# Patient Record
Sex: Female | Born: 1935 | Race: White | Hispanic: No | Marital: Single | Smoking: Never smoker
Health system: Southern US, Community
[De-identification: ages and names within clinical notes are randomized; demographics above are authoritative.]

## PROBLEM LIST (undated history)

## (undated) DIAGNOSIS — G4733 Obstructive sleep apnea (adult) (pediatric): Secondary | ICD-10-CM

## (undated) DIAGNOSIS — N259 Disorder resulting from impaired renal tubular function, unspecified: Secondary | ICD-10-CM

## (undated) DIAGNOSIS — M1990 Unspecified osteoarthritis, unspecified site: Secondary | ICD-10-CM

## (undated) DIAGNOSIS — E039 Hypothyroidism, unspecified: Secondary | ICD-10-CM

## (undated) DIAGNOSIS — M810 Age-related osteoporosis without current pathological fracture: Secondary | ICD-10-CM

## (undated) DIAGNOSIS — J309 Allergic rhinitis, unspecified: Secondary | ICD-10-CM

## (undated) DIAGNOSIS — E119 Type 2 diabetes mellitus without complications: Secondary | ICD-10-CM

## (undated) DIAGNOSIS — G4700 Insomnia, unspecified: Secondary | ICD-10-CM

## (undated) DIAGNOSIS — F341 Dysthymic disorder: Secondary | ICD-10-CM

## (undated) DIAGNOSIS — R7983 Abnormal findings of blood amino-acid level: Secondary | ICD-10-CM

## (undated) DIAGNOSIS — K219 Gastro-esophageal reflux disease without esophagitis: Secondary | ICD-10-CM

## (undated) DIAGNOSIS — I82409 Acute embolism and thrombosis of unspecified deep veins of unspecified lower extremity: Secondary | ICD-10-CM

## (undated) DIAGNOSIS — E7800 Pure hypercholesterolemia, unspecified: Secondary | ICD-10-CM

## (undated) DIAGNOSIS — I10 Essential (primary) hypertension: Secondary | ICD-10-CM

## (undated) DIAGNOSIS — E7219 Other disorders of sulfur-bearing amino-acid metabolism: Secondary | ICD-10-CM

## (undated) HISTORY — DX: Type 2 diabetes mellitus without complications: E11.9

## (undated) HISTORY — DX: Essential (primary) hypertension: I10

## (undated) HISTORY — DX: Obstructive sleep apnea (adult) (pediatric): G47.33

## (undated) HISTORY — DX: Acute embolism and thrombosis of unspecified deep veins of unspecified lower extremity: I82.409

## (undated) HISTORY — DX: Dysthymic disorder: F34.1

## (undated) HISTORY — DX: Disorder resulting from impaired renal tubular function, unspecified: N25.9

## (undated) HISTORY — DX: Gastro-esophageal reflux disease without esophagitis: K21.9

## (undated) HISTORY — DX: Age-related osteoporosis without current pathological fracture: M81.0

## (undated) HISTORY — DX: Insomnia, unspecified: G47.00

## (undated) HISTORY — DX: Abnormal findings of blood amino-acid level: R79.83

## (undated) HISTORY — DX: Hypothyroidism, unspecified: E03.9

## (undated) HISTORY — DX: Pure hypercholesterolemia, unspecified: E78.00

## (undated) HISTORY — DX: Other disorders of sulfur-bearing amino-acid metabolism: E72.19

## (undated) HISTORY — DX: Allergic rhinitis, unspecified: J30.9

## (undated) HISTORY — DX: Unspecified osteoarthritis, unspecified site: M19.90

---

## 1999-04-09 ENCOUNTER — Encounter: Admission: RE | Admit: 1999-04-09 | Discharge: 1999-06-14 | Payer: Self-pay | Admitting: Endocrinology

## 1999-04-22 ENCOUNTER — Encounter: Payer: Self-pay | Admitting: Specialist

## 1999-04-26 ENCOUNTER — Ambulatory Visit (HOSPITAL_COMMUNITY): Admission: RE | Admit: 1999-04-26 | Discharge: 1999-04-26 | Payer: Self-pay | Admitting: Specialist

## 1999-08-18 ENCOUNTER — Encounter: Admission: RE | Admit: 1999-08-18 | Discharge: 1999-11-12 | Payer: Self-pay | Admitting: Internal Medicine

## 2000-04-12 ENCOUNTER — Encounter: Payer: Self-pay | Admitting: Specialist

## 2000-04-17 ENCOUNTER — Ambulatory Visit (HOSPITAL_COMMUNITY): Admission: RE | Admit: 2000-04-17 | Discharge: 2000-04-17 | Payer: Self-pay | Admitting: Specialist

## 2001-03-21 ENCOUNTER — Other Ambulatory Visit: Admission: RE | Admit: 2001-03-21 | Discharge: 2001-03-21 | Payer: Self-pay | Admitting: Gastroenterology

## 2001-03-21 ENCOUNTER — Encounter (INDEPENDENT_AMBULATORY_CARE_PROVIDER_SITE_OTHER): Payer: Self-pay | Admitting: Specialist

## 2002-02-20 ENCOUNTER — Encounter: Payer: Self-pay | Admitting: Endocrinology

## 2002-02-20 ENCOUNTER — Ambulatory Visit (HOSPITAL_COMMUNITY): Admission: RE | Admit: 2002-02-20 | Discharge: 2002-02-20 | Payer: Self-pay | Admitting: Endocrinology

## 2002-04-25 ENCOUNTER — Encounter: Admission: RE | Admit: 2002-04-25 | Discharge: 2002-04-25 | Payer: Self-pay | Admitting: Endocrinology

## 2002-04-25 ENCOUNTER — Encounter: Payer: Self-pay | Admitting: Endocrinology

## 2002-05-27 ENCOUNTER — Ambulatory Visit (HOSPITAL_BASED_OUTPATIENT_CLINIC_OR_DEPARTMENT_OTHER): Admission: RE | Admit: 2002-05-27 | Discharge: 2002-05-27 | Payer: Self-pay | Admitting: Internal Medicine

## 2002-05-30 ENCOUNTER — Encounter: Payer: Self-pay | Admitting: Endocrinology

## 2002-05-30 ENCOUNTER — Encounter: Admission: RE | Admit: 2002-05-30 | Discharge: 2002-05-30 | Payer: Self-pay | Admitting: Endocrinology

## 2004-05-12 ENCOUNTER — Ambulatory Visit (HOSPITAL_COMMUNITY): Admission: RE | Admit: 2004-05-12 | Discharge: 2004-05-12 | Payer: Self-pay | Admitting: Internal Medicine

## 2004-07-10 ENCOUNTER — Encounter: Admission: RE | Admit: 2004-07-10 | Discharge: 2004-07-10 | Payer: Self-pay | Admitting: Endocrinology

## 2004-09-29 ENCOUNTER — Ambulatory Visit: Payer: Self-pay | Admitting: Endocrinology

## 2004-12-24 ENCOUNTER — Ambulatory Visit: Payer: Self-pay | Admitting: Gastroenterology

## 2005-01-20 ENCOUNTER — Ambulatory Visit: Payer: Self-pay | Admitting: Internal Medicine

## 2005-02-09 ENCOUNTER — Ambulatory Visit: Payer: Self-pay | Admitting: Internal Medicine

## 2005-02-11 ENCOUNTER — Ambulatory Visit: Payer: Self-pay

## 2005-03-29 ENCOUNTER — Ambulatory Visit: Payer: Self-pay | Admitting: Endocrinology

## 2005-05-06 ENCOUNTER — Ambulatory Visit: Payer: Self-pay | Admitting: Endocrinology

## 2005-06-27 ENCOUNTER — Ambulatory Visit: Payer: Self-pay | Admitting: Endocrinology

## 2005-08-29 ENCOUNTER — Ambulatory Visit: Payer: Self-pay | Admitting: Internal Medicine

## 2005-10-10 ENCOUNTER — Ambulatory Visit: Payer: Self-pay | Admitting: Endocrinology

## 2005-10-26 ENCOUNTER — Ambulatory Visit: Payer: Self-pay | Admitting: Internal Medicine

## 2006-01-31 ENCOUNTER — Ambulatory Visit: Payer: Self-pay | Admitting: Endocrinology

## 2006-04-06 ENCOUNTER — Ambulatory Visit: Payer: Self-pay | Admitting: Endocrinology

## 2006-04-26 ENCOUNTER — Ambulatory Visit: Payer: Self-pay | Admitting: Endocrinology

## 2006-05-03 ENCOUNTER — Ambulatory Visit: Payer: Self-pay | Admitting: Endocrinology

## 2006-06-15 ENCOUNTER — Ambulatory Visit: Payer: Self-pay | Admitting: Gastroenterology

## 2006-06-23 ENCOUNTER — Ambulatory Visit: Payer: Self-pay | Admitting: Gastroenterology

## 2006-06-30 ENCOUNTER — Ambulatory Visit: Payer: Self-pay | Admitting: Gastroenterology

## 2006-07-03 ENCOUNTER — Encounter (INDEPENDENT_AMBULATORY_CARE_PROVIDER_SITE_OTHER): Payer: Self-pay | Admitting: *Deleted

## 2006-07-03 ENCOUNTER — Ambulatory Visit: Payer: Self-pay | Admitting: Gastroenterology

## 2006-07-03 LAB — HM COLONOSCOPY

## 2006-07-07 ENCOUNTER — Ambulatory Visit: Payer: Self-pay | Admitting: Gastroenterology

## 2006-08-14 ENCOUNTER — Ambulatory Visit: Payer: Self-pay | Admitting: Endocrinology

## 2006-08-21 ENCOUNTER — Ambulatory Visit: Payer: Self-pay | Admitting: Gastroenterology

## 2006-10-02 ENCOUNTER — Ambulatory Visit: Payer: Self-pay | Admitting: Endocrinology

## 2006-10-02 LAB — CONVERTED CEMR LAB
Chol/HDL Ratio, serum: 3
Hgb A1c MFr Bld: 6.3 % — ABNORMAL HIGH (ref 4.6–6.0)
VLDL: 19 mg/dL (ref 0–40)

## 2007-01-02 ENCOUNTER — Ambulatory Visit: Payer: Self-pay | Admitting: Endocrinology

## 2007-02-09 ENCOUNTER — Ambulatory Visit: Payer: Self-pay | Admitting: Internal Medicine

## 2007-04-02 ENCOUNTER — Ambulatory Visit: Payer: Self-pay | Admitting: Endocrinology

## 2007-04-03 LAB — CONVERTED CEMR LAB
Basophils Relative: 3.7 % — ABNORMAL HIGH (ref 0.0–1.0)
Eosinophils Relative: 1.7 % (ref 0.0–5.0)
Iron: 66 ug/dL (ref 42–145)
Lymphocytes Relative: 20.9 % (ref 12.0–46.0)
Neutro Abs: 3.3 10*3/uL (ref 1.4–7.7)
Platelets: 242 10*3/uL (ref 150–400)
RDW: 12.9 % (ref 11.5–14.6)
TSH: 1.16 microintl units/mL (ref 0.35–5.50)
WBC: 5.1 10*3/uL (ref 4.5–10.5)

## 2007-04-17 ENCOUNTER — Encounter: Admission: RE | Admit: 2007-04-17 | Discharge: 2007-04-17 | Payer: Self-pay | Admitting: Endocrinology

## 2007-06-06 ENCOUNTER — Encounter: Payer: Self-pay | Admitting: Endocrinology

## 2007-06-06 DIAGNOSIS — M810 Age-related osteoporosis without current pathological fracture: Secondary | ICD-10-CM

## 2007-06-06 DIAGNOSIS — I10 Essential (primary) hypertension: Secondary | ICD-10-CM

## 2007-06-06 DIAGNOSIS — J3089 Other allergic rhinitis: Secondary | ICD-10-CM

## 2007-06-06 DIAGNOSIS — E039 Hypothyroidism, unspecified: Secondary | ICD-10-CM

## 2007-06-06 DIAGNOSIS — E119 Type 2 diabetes mellitus without complications: Secondary | ICD-10-CM

## 2007-06-06 DIAGNOSIS — M1990 Unspecified osteoarthritis, unspecified site: Secondary | ICD-10-CM

## 2007-06-06 DIAGNOSIS — N259 Disorder resulting from impaired renal tubular function, unspecified: Secondary | ICD-10-CM | POA: Insufficient documentation

## 2007-06-06 DIAGNOSIS — K219 Gastro-esophageal reflux disease without esophagitis: Secondary | ICD-10-CM

## 2007-06-06 DIAGNOSIS — J302 Other seasonal allergic rhinitis: Secondary | ICD-10-CM

## 2007-06-06 HISTORY — DX: Disorder resulting from impaired renal tubular function, unspecified: N25.9

## 2007-06-06 HISTORY — DX: Age-related osteoporosis without current pathological fracture: M81.0

## 2007-06-06 HISTORY — DX: Gastro-esophageal reflux disease without esophagitis: K21.9

## 2007-06-06 HISTORY — DX: Hypothyroidism, unspecified: E03.9

## 2007-06-06 HISTORY — DX: Type 2 diabetes mellitus without complications: E11.9

## 2007-06-06 HISTORY — DX: Essential (primary) hypertension: I10

## 2007-07-12 ENCOUNTER — Ambulatory Visit: Payer: Self-pay | Admitting: Endocrinology

## 2007-11-10 ENCOUNTER — Ambulatory Visit: Payer: Self-pay | Admitting: Family Medicine

## 2007-11-10 LAB — CONVERTED CEMR LAB: Rapid Strep: NEGATIVE

## 2007-11-29 ENCOUNTER — Ambulatory Visit: Payer: Self-pay | Admitting: Internal Medicine

## 2007-11-29 ENCOUNTER — Ambulatory Visit: Payer: Self-pay | Admitting: Endocrinology

## 2008-02-06 DIAGNOSIS — G4733 Obstructive sleep apnea (adult) (pediatric): Secondary | ICD-10-CM | POA: Insufficient documentation

## 2008-02-07 ENCOUNTER — Ambulatory Visit: Payer: Self-pay | Admitting: Internal Medicine

## 2008-02-08 ENCOUNTER — Telehealth (INDEPENDENT_AMBULATORY_CARE_PROVIDER_SITE_OTHER): Payer: Self-pay | Admitting: *Deleted

## 2008-02-11 ENCOUNTER — Ambulatory Visit: Payer: Self-pay | Admitting: Endocrinology

## 2008-02-12 LAB — CONVERTED CEMR LAB: Hgb A1c MFr Bld: 6.6 % — ABNORMAL HIGH (ref 4.6–6.0)

## 2008-02-18 ENCOUNTER — Encounter: Payer: Self-pay | Admitting: Endocrinology

## 2008-02-19 ENCOUNTER — Ambulatory Visit: Payer: Self-pay | Admitting: Endocrinology

## 2008-02-19 DIAGNOSIS — E7800 Pure hypercholesterolemia, unspecified: Secondary | ICD-10-CM

## 2008-02-19 HISTORY — DX: Pure hypercholesterolemia, unspecified: E78.00

## 2008-02-19 LAB — CONVERTED CEMR LAB
ALT: 12 units/L (ref 0–35)
AST: 15 units/L (ref 0–37)
Alkaline Phosphatase: 50 units/L (ref 39–117)
Bilirubin, Direct: 0.1 mg/dL (ref 0.0–0.3)
Cholesterol: 145 mg/dL (ref 0–200)
Total Protein: 6.7 g/dL (ref 6.0–8.3)
VLDL: 17 mg/dL (ref 0–40)

## 2008-03-07 ENCOUNTER — Encounter: Payer: Self-pay | Admitting: Endocrinology

## 2008-03-10 ENCOUNTER — Encounter: Payer: Self-pay | Admitting: Endocrinology

## 2008-07-17 ENCOUNTER — Telehealth: Payer: Self-pay | Admitting: Endocrinology

## 2008-12-09 ENCOUNTER — Ambulatory Visit: Payer: Self-pay | Admitting: Endocrinology

## 2009-03-16 ENCOUNTER — Ambulatory Visit: Payer: Self-pay | Admitting: Internal Medicine

## 2009-06-04 ENCOUNTER — Encounter: Payer: Self-pay | Admitting: Endocrinology

## 2009-09-28 ENCOUNTER — Ambulatory Visit: Payer: Self-pay | Admitting: Internal Medicine

## 2009-10-05 ENCOUNTER — Ambulatory Visit: Payer: Self-pay | Admitting: Endocrinology

## 2009-10-05 DIAGNOSIS — R5383 Other fatigue: Secondary | ICD-10-CM

## 2009-10-05 DIAGNOSIS — R55 Syncope and collapse: Secondary | ICD-10-CM

## 2009-10-05 DIAGNOSIS — R5381 Other malaise: Secondary | ICD-10-CM | POA: Insufficient documentation

## 2009-10-09 ENCOUNTER — Telehealth (INDEPENDENT_AMBULATORY_CARE_PROVIDER_SITE_OTHER): Payer: Self-pay

## 2009-10-12 ENCOUNTER — Ambulatory Visit: Payer: Self-pay

## 2009-10-12 ENCOUNTER — Telehealth: Payer: Self-pay | Admitting: Endocrinology

## 2009-10-12 ENCOUNTER — Ambulatory Visit: Payer: Self-pay | Admitting: Cardiology

## 2009-10-12 ENCOUNTER — Encounter (HOSPITAL_COMMUNITY): Admission: RE | Admit: 2009-10-12 | Discharge: 2009-11-12 | Payer: Self-pay | Admitting: Endocrinology

## 2009-10-14 ENCOUNTER — Telehealth: Payer: Self-pay | Admitting: Endocrinology

## 2010-03-15 ENCOUNTER — Ambulatory Visit: Payer: Self-pay | Admitting: Internal Medicine

## 2010-03-23 ENCOUNTER — Ambulatory Visit: Payer: Self-pay | Admitting: Family Medicine

## 2010-03-23 ENCOUNTER — Encounter: Payer: Self-pay | Admitting: Endocrinology

## 2010-04-18 ENCOUNTER — Encounter: Payer: Self-pay | Admitting: Internal Medicine

## 2010-04-30 ENCOUNTER — Ambulatory Visit: Payer: Self-pay | Admitting: Endocrinology

## 2010-04-30 LAB — CONVERTED CEMR LAB
CO2: 27 meq/L (ref 19–32)
Cholesterol: 134 mg/dL (ref 0–200)
Creatinine,U: 111.8 mg/dL
Glucose, Bld: 117 mg/dL — ABNORMAL HIGH (ref 70–99)
Hgb A1c MFr Bld: 7.2 % — ABNORMAL HIGH (ref 4.6–6.5)
Microalb Creat Ratio: 1.3 mg/g (ref 0.0–30.0)
Potassium: 4.4 meq/L (ref 3.5–5.1)
Sodium: 141 meq/L (ref 135–145)

## 2010-05-31 ENCOUNTER — Ambulatory Visit: Payer: Self-pay | Admitting: Endocrinology

## 2010-05-31 DIAGNOSIS — G4700 Insomnia, unspecified: Secondary | ICD-10-CM | POA: Insufficient documentation

## 2010-06-30 ENCOUNTER — Encounter: Payer: Self-pay | Admitting: Internal Medicine

## 2010-08-26 ENCOUNTER — Ambulatory Visit: Payer: Self-pay | Admitting: Internal Medicine

## 2010-08-30 ENCOUNTER — Telehealth: Payer: Self-pay | Admitting: Internal Medicine

## 2010-09-01 ENCOUNTER — Telehealth: Payer: Self-pay | Admitting: Internal Medicine

## 2010-09-14 ENCOUNTER — Telehealth: Payer: Self-pay | Admitting: Internal Medicine

## 2010-09-17 ENCOUNTER — Ambulatory Visit: Payer: Self-pay | Admitting: Internal Medicine

## 2010-09-27 ENCOUNTER — Encounter: Payer: Self-pay | Admitting: Internal Medicine

## 2010-10-21 ENCOUNTER — Telehealth: Payer: Self-pay | Admitting: Endocrinology

## 2010-12-06 ENCOUNTER — Encounter: Payer: Self-pay | Admitting: Family Medicine

## 2010-12-12 LAB — CONVERTED CEMR LAB
ALT: 15 units/L (ref 0–35)
AST: 24 units/L (ref 0–37)
Albumin: 4.2 g/dL (ref 3.5–5.2)
Alkaline Phosphatase: 48 units/L (ref 39–117)
Alkaline Phosphatase: 51 units/L (ref 39–117)
BUN: 14 mg/dL (ref 6–23)
Basophils Relative: 0.2 % (ref 0.0–3.0)
Bilirubin Urine: NEGATIVE
Bilirubin, Direct: 0.1 mg/dL (ref 0.0–0.3)
CO2: 27 meq/L (ref 19–32)
CO2: 28 meq/L (ref 19–32)
Calcium: 9.4 mg/dL (ref 8.4–10.5)
Chloride: 100 meq/L (ref 96–112)
Chloride: 101 meq/L (ref 96–112)
Cholesterol: 194 mg/dL (ref 0–200)
Eosinophils Absolute: 0.1 10*3/uL (ref 0.0–0.7)
Eosinophils Relative: 1.2 % (ref 0.0–5.0)
Glucose, Bld: 111 mg/dL — ABNORMAL HIGH (ref 70–99)
HCT: 39.3 % (ref 36.0–46.0)
HDL: 47.3 mg/dL (ref >39.00)
Hemoglobin, Urine: NEGATIVE
Hemoglobin: 12.9 g/dL (ref 12.0–15.0)
Hemoglobin: 13 g/dL (ref 12.0–15.0)
Hgb A1c MFr Bld: 6.7 % — ABNORMAL HIGH (ref 4.6–6.5)
LDL Cholesterol: 121 mg/dL — ABNORMAL HIGH (ref 0–99)
LDL Cholesterol: 96 mg/dL (ref 0–99)
Lymphocytes Relative: 23.2 % (ref 12.0–46.0)
Lymphocytes Relative: 23.4 % (ref 12.0–46.0)
MCHC: 33.6 g/dL (ref 30.0–36.0)
Monocytes Absolute: 0.6 10*3/uL (ref 0.1–1.0)
Monocytes Relative: 8.3 % (ref 3.0–12.0)
Monocytes Relative: 9.6 % (ref 3.0–12.0)
Neutro Abs: 4.7 10*3/uL (ref 1.4–7.7)
Neutrophils Relative %: 64.9 % (ref 43.0–77.0)
Nitrite: NEGATIVE
Potassium: 4.3 meq/L (ref 3.5–5.1)
RBC: 4.25 M/uL (ref 3.87–5.11)
RBC: 4.43 M/uL (ref 3.87–5.11)
Sodium: 138 meq/L (ref 135–145)
TSH: 1.13 microintl units/mL (ref 0.35–5.50)
Total Bilirubin: 0.7 mg/dL (ref 0.3–1.2)
Total CHOL/HDL Ratio: 3.7
Total CHOL/HDL Ratio: 4
Total Protein, Urine: NEGATIVE mg/dL
Total Protein: 7.1 g/dL (ref 6.0–8.3)
Total Protein: 7.3 g/dL (ref 6.0–8.3)
Triglycerides: 130 mg/dL (ref 0.0–149.0)
Urine Glucose: NEGATIVE mg/dL
VLDL: 26 mg/dL (ref 0.0–40.0)
WBC: 6.7 10*3/uL (ref 4.5–10.5)
WBC: 7.2 10*3/uL (ref 4.5–10.5)
pH: 5 (ref 5.0–8.0)

## 2010-12-16 NOTE — Miscellaneous (Signed)
Summary: BONE DENSITY  Clinical Lists Changes  Orders: Added new Test order of T-Bone Densitometry (77080) - Signed Added new Test order of T-Lumbar Vertebral Assessment (77082) - Signed 

## 2010-12-16 NOTE — Assessment & Plan Note (Signed)
Summary: PER [facility] YEARLY [facility]   Vital Signs:  Patient profile:   77 year old female Height:      64 inches Weight:      210 pounds O2 Sat:      96 % on Room air Temp:     97.8 degrees F oral Pulse rate:   86 / minute BP sitting:   138 / 80  (left arm) Cuff size:   large  Vitals Entered By: Margaret Pyle, CMA (April 30, 2010 8:03 AM)  O2 Flow:  Room air CC: Yearly - pt is taking several OTC Vitaimins  Vision Screening:Left eye with correction: 20 / 20 Right eye with correction: 20 / 30 Both eyes with correction: 20 / 20        Vision Entered By: Margaret Pyle, CMA (April 30, 2010 8:43 AM)   Primary Provider:  Everardo All  CC:  Yearly - pt is taking several OTC Vitaimins.  History of Present Illness: pt is here for medicare welllness visit.  she denies memory loss and depression.  she says she is able to perform activities of daily living without assistance.  she has no limitations to physical activity.   Current Medications (verified): 1)  Lisinopril-Hydrochlorothiazide 20-12.5 Mg  Tabs (Lisinopril-Hydrochlorothiazide) .... Take 1 By Mouth Once Daily 2)  Prilosec 20 Mg  Cpdr (Omeprazole) .... Take 1 Tablet By Mouth Once A Day 3)  Levoxyl 100 Mcg  Tabs (Levothyroxine Sodium) .... Take 1 By Mouth Once Daily 4)  Klor-Con M20 20 Meq  Tbcr (Potassium Chloride Crys Cr) .... Take 1 By Mouth Once Daily 5)  Aspirin 81 Mg  Tbec (Aspirin) .... Take 1 By Mouth Once Daily 6)  Folic Acid 400 Mcg Tabs (Folic Acid) .... 2 By Mouth Once Daily 7)  Cpap 11 [facility] 8)  Glucophage Xr 500 Mg  Tb24 (Metformin Hcl) .... 4 Tabs Qam 9)  Pravastatin Sodium 40 Mg Tabs (Pravastatin Sodium) .... Qhs 10)  Nystatin 100000 Unit/gm Powd (Nystatin) .... Use On Affected Skin Three Times A Day As Needed  Allergies (verified): 1)  ! Penicillin 2)  ! * Soy (?)  Past History:  Past Medical History: Diabetes mellitus, type  II GERD Hypertension Hypothyroidism Osteoarthritis Osteoporosis Renal insufficiency dysthymic Disorder Hiatal Hernia DVT Left Leg Mild Anemia Homocysteinemia Sleep Apnea- 05/27/02 NPSG 42/hr, cpap 11 RDI 5/hr Allergic rhinitis  Family History: Reviewed history from 03/16/2009 and no changes required. Father- died coronary aneurysm Mother- had phlebitis and died of CVA  Social History: Reviewed history from 03/16/2009 and no changes required. Not married Never smoked but had long passive smoke exposure. no alcohol or ilicit drugs Built new house lives alone  Review of Systems  The patient denies chest pain and dyspnea on exertion.    Physical Exam  General:  obese.  no distress  Ears:  normal hearing.   Neck:  Supple without thyroid enlargement or tenderness. .  Lungs:  Clear to auscultation bilaterally. Normal respiratory effort.  Heart:  Regular rate and rhythm without murmurs or gallops noted. Normal S1,S2.   Abdomen:  abdomen is soft, nontender.  no hepatosplenomegaly.   not distended.  no hernia  Msk:  muscle bulk and strength are grossly normal.  no obvious joint swelling.  gait is normal and steady easily performs "get-up-and-go" from a seated start. Pulses:  dorsalis pedis intact bilat.  no carotid bruit  Extremities:  no deformity.  no ulcer on the feet.  feet are of normal color and temp.  no edema  Neurologic:  cn 2-12 grossly intact.   readily moves all 4's.   sensation is intact to touch on the feet  Psych:  remembers 3/3 at 5 minutes.  excellent recall.  can easily read and write a sentence.  alert and oriented x 3    Impression & Recommendations:  Problem # 1:  ROUTINE GENERAL MEDICAL EXAM@HEALTH  CARE FACL (ICD-V70.0)  Medications Added to Medication List This Visit: 1)  Screening Mammogram   Other Orders: T-Parathyroid Hormone, Intact w/ Calcium (60454-09811) TLB-A1C / Hgb A1C (Glycohemoglobin) (83036-A1C) TLB-Microalbumin/Creat Ratio,  Urine (82043-MALB) TLB-Lipid Panel (80061-LIPID) TLB-BMP (Basic Metabolic Panel-BMET) (80048-METABOL) Subsequent annual wellness visit with prevention plan (B1478)   Patient Instructions: 1)  blood tests are being ordered for you today.  please call [phone] to hear your test results. 2)  it is critically important to maintain a healthy body weight.  i would be happy to refer you to a dietician.  we are checking for diabetes and cholesterol today. 3)  please consider these measures for your health:  minimize alcohol.  do not use tobacco products.  have a colonoscopy at least every 10 years from age 12.  keep firearms safely stored.  always use seat belts.  have working smoke alarms in your home.  see the dentist regularly.  never drive under the influence of alcohol or drugs (including prescription drugs).  those with fair skin should take precautions against the sun. 4)  please let me know what your wishes would be, if artificial life support measures should become necessary.  it is critically important to prevent falling down (keep floor areas well-lit, dry, and free of loose objects) 5)  here is a list of medicare-covered preventive services. 6)  here is a prescription for a mammogram.  [facility] 7)  (update: i left message on phone-tree:  you should add another med for dm.  options are actos, [place], and parlodel Prescriptions: SCREENING MAMMOGRAM   #0 x 0   Entered and Authorized by:   Minus Breeding MD   Signed by:   Minus Breeding MD on 04/30/2010   Method used:   Print then Give to Patient   RxID:   [id number]   Prevention & Chronic Care Immunizations   Influenza vaccine: Historical  (09/14/2009)    Tetanus booster: 08/14/1996: Td    Pneumococcal vaccine: Pneumovax  (10/14/2005)    H. zoster vaccine: Not documented  Colorectal Screening   Hemoccult: Not documented    Colonoscopy: Done  (07/03/2006)  Other Screening   Pap  smear: Not documented    Mammogram: Not documented    DXA bone density scan: abnormal  (11/29/2007)   Smoking status: never  (03/16/2009)  Diabetes Mellitus   HgbA1C: 6.7  (10/05/2009)    Eye exam: Not documented    Foot exam: Not documented   High risk foot: Not documented   Foot care education: Not documented    Urine microalbumin/creatinine ratio: Not documented  Lipids   Total Cholesterol: 194  (10/05/2009)   LDL: 121  (10/05/2009)   LDL Direct: Not documented   HDL: 47.30  (10/05/2009)   Triglycerides: 130.0  (10/05/2009)    SGOT (AST): 21  (10/05/2009)   SGPT (ALT): 12  (10/05/2009)   Alkaline phosphatase: 51  (10/05/2009)   Total bilirubin: 0.7  (10/05/2009)  Hypertension   Last Blood Pressure: 138 / 80  (04/30/2010)   Serum creatinine: 1.1  (10/05/2009)   Serum  potassium 4.3  (10/05/2009)  Self-Management Support :    Diabetes self-management support: Not documented    Hypertension self-management support: Not documented    Lipid self-management support: Not documented

## 2010-12-16 NOTE — Miscellaneous (Signed)
Summary: Respiratory Support/[facility]  Respiratory Support/[facility]   Imported By: Lester Danube 10/13/2010 10:53:34  _____________________________________________________________________  External Attachment:    Type:   Image     Comment:   External Document

## 2010-12-16 NOTE — Letter (Signed)
Summary: CMN for CPAP Supplies/[facility]  CMN for CPAP Supplies/[facility]   Imported By: Sherian Rein 04/22/2010 11:36:40  _____________________________________________________________________  External Attachment:    Type:   Image     Comment:   External Document

## 2010-12-16 NOTE — Progress Notes (Signed)
Summary: rx refill req  Phone Note Refill Request Message from:  Fax from Pharmacy on October 21, 2010 3:49 PM  Refills Requested: Medication #1:  LEVOXYL 100 MCG  TABS take 1 by mouth once daily   Dosage confirmed as above?Dosage Confirmed   Last Refilled: 07/12/2010  Medication #2:  GLUCOPHAGE XR 500 MG  TB24 4 tabs qam   Dosage confirmed as above?Dosage Confirmed   Last Refilled: 08/02/2010  Method Requested: Electronic Next Appointment Scheduled: none Initial call taken by: Brenton Grills CMA Duncan Dull),  October 21, 2010 3:50 PM    Prescriptions: GLUCOPHAGE XR 500 MG  TB24 (METFORMIN HCL) 4 tabs qam  #360 Each x 1   Entered by:   Brenton Grills CMA (AAMA)   Authorized by:   Minus Breeding MD   Signed by:   Brenton Grills CMA (AAMA) on 10/21/2010   Method used:   Electronically to        [facility]  [address] (retail)       [address]       [place], [place]  [address]       Ph: [phone]       Fax: [phone]   RxID:   [id number] LEVOXYL 100 MCG  TABS (LEVOTHYROXINE SODIUM) take 1 by mouth once daily  #90 Each x 1   Entered by:   Brenton Grills CMA (AAMA)   Authorized by:   Minus Breeding MD   Signed by:   Brenton Grills CMA (AAMA) on 10/21/2010   Method used:   Electronically to        [facility]  [address] (retail)       [address]       [place], [place]  [address]       Ph: [phone]       Fax: [phone]   RxID:   [id number] GLUCOPHAGE XR 500 MG  TB24 (METFORMIN HCL) 4 tabs qam  #360 Each x 1   Entered and Authorized by:   Brenton Grills CMA (AAMA)   Signed by:   Brenton Grills CMA (AAMA) on 10/21/2010   Method used:   Faxed to ...       [facility]  [address] (retail)       [address]       [place], [place]  [address]       Ph: [phone]       Fax: [phone]   RxID:   [id number] LEVOXYL 100 MCG  TABS (LEVOTHYROXINE SODIUM) take 1 by mouth once daily  #90 Each x 1   Entered and  Authorized by:   Brenton Grills CMA (AAMA)   Signed by:   Brenton Grills CMA (AAMA) on 10/21/2010   Method used:   Faxed to ...       [facility]  [address] (retail)       [address]       [place], [place]  [address]       Ph: [phone]       Fax: [phone]   RxID:   [id number]

## 2010-12-16 NOTE — Progress Notes (Signed)
Summary: nos appt  Phone Note Call from Patient   Caller: [email or web address]  Call For: Aleksey Newbern Summary of Call: Rsc nos from 10/31 to 11/4. Initial call taken by: Darletta Moll,  September 14, 2010 9:49 AM

## 2010-12-16 NOTE — Assessment & Plan Note (Signed)
Summary: F/U APPT PER PT/CD   Vital Signs:  Patient profile:   77 year old female Height:      64 inches (162.56 cm) Weight:      212.75 pounds (96.70 kg) BMI:     36.65 O2 Sat:      97 % on Room air Temp:     97.1 degrees F (36.17 degrees C) oral Pulse rate:   82 / minute BP sitting:   134 / 82  (left arm) Cuff size:   large  Vitals Entered By: Brenton Grills MA (May 31, 2010 1:35 PM)  O2 Flow:  Room air CC: F/U appt/pt may be due for tetanus, pt is due for mammogram and pap/aj   Primary Provider:  Everardo All  CC:  F/U appt/pt may be due for tetanus and pt is due for mammogram and pap/aj.  History of Present Illness: the status of at least 3 ongoing medical problems is addressed today: dm:  pt says she tolerated the actos well in the past, but it was expensive.   edema:  persists, however.   insomnia:  pt says she does not want to take trazodone any further.   Current Medications (verified): 1)  Lisinopril-Hydrochlorothiazide 20-12.5 Mg  Tabs (Lisinopril-Hydrochlorothiazide) .... Take 1 By Mouth Once Daily 2)  Prilosec 20 Mg  Cpdr (Omeprazole) .... Take 1 Tablet By Mouth Once A Day 3)  Levoxyl 100 Mcg  Tabs (Levothyroxine Sodium) .... Take 1 By Mouth Once Daily 4)  Klor-Con M20 20 Meq  Tbcr (Potassium Chloride Crys Cr) .... Take 1 By Mouth Once Daily 5)  Aspirin 81 Mg  Tbec (Aspirin) .... Take 1 By Mouth Once Daily 6)  Folic Acid 400 Mcg Tabs (Folic Acid) .... 2 By Mouth Once Daily 7)  Cpap 11 [facility] 8)  Glucophage Xr 500 Mg  Tb24 (Metformin Hcl) .... 4 Tabs Qam 9)  Pravastatin Sodium 40 Mg Tabs (Pravastatin Sodium) .... Qhs 10)  Nystatin 100000 Unit/gm Powd (Nystatin) .... Use On Affected Skin Three Times A Day As Needed 11)  Screening Mammogram  Allergies (verified): 1)  ! Penicillin 2)  ! * Soy (?)  Past History:  Past Medical History: Last updated: 04/30/2010 Diabetes mellitus, type II GERD Hypertension Hypothyroidism Osteoarthritis Osteoporosis Renal  insufficiency dysthymic Disorder Hiatal Hernia DVT Left Leg Mild Anemia Homocysteinemia Sleep Apnea- 05/27/02 NPSG 42/hr, cpap 11 RDI 5/hr Allergic rhinitis  Review of Systems  The patient denies dyspnea on exertion.         denies depression  Physical Exam  General:  obese.  no distress  Extremities:  no edema    Impression & Recommendations:  Problem # 1:  DIABETES MELLITUS, TYPE II (ICD-250.00) needs increased rx  HgbA1C: 7.2 (04/30/2010)  Problem # 2:  edema mild.  no need to d/c actos  Problem # 3:  INSOMNIA (ICD-780.52) needs increased rx  Medications Added to Medication List This Visit: 1)  Actos 45 Mg Tabs (Pioglitazone hcl) .... 1/2 tab every other day 2)  Bromocriptine Mesylate 2.5 Mg Tabs (Bromocriptine mesylate) .... 1/2 tab at bedtime  Other Orders: Diabetic Clinic Referral (Diabetic) Est. Patient Level IV ([address])  Patient Instructions: 1)  Please schedule a follow-up appointment in 3 months. 2)  add bromocriptine 1/2 of 2.5 mg at bedtime. 3)  refer dietician.  you will be called with a day and time for an appointment. 4)  (desyrel is declined). Prescriptions: BROMOCRIPTINE MESYLATE 2.5 MG TABS (BROMOCRIPTINE MESYLATE) 1/2 tab at bedtime  #15  x 11   Entered and Authorized by:   Minus Breeding MD   Signed by:   Minus Breeding MD on 05/31/2010   Method used:   Electronically to        [facility]  [address] (retail)       [address]       [place], [place]  [address]       Ph: [phone]       Fax: [phone]   RxID:   [id number] BROMOCRIPTINE MESYLATE 2.5 MG TABS (BROMOCRIPTINE MESYLATE) 1/2 tab at bedtime  #15 x 11   Entered and Authorized by:   Minus Breeding MD   Signed by:   Minus Breeding MD on 05/31/2010   Method used:   Electronically to        [facility]  [address] (retail)       [address]       [place], [place]  [address]       Ph: [phone]       Fax: [phone]   RxID:    [id number] ACTOS 45 MG TABS (PIOGLITAZONE HCL) 1/2 tab every other day  #7 x 11   Entered and Authorized by:   Minus Breeding MD   Signed by:   Minus Breeding MD on 05/31/2010   Method used:   Electronically to        [facility]  [address] (retail)       [address]       [place], [place]  [address]       Ph: [phone]       Fax: [phone]   RxID:   [id number]

## 2010-12-16 NOTE — Assessment & Plan Note (Signed)
Summary: 1 year/ mbw   Primary Provider/Referring Provider:  Everardo All  CC:  Yearly follow up visit-CPAP usage not at the moment..  History of Present Illness: 02/07/08- Destiny Flowers returns for follow-up of her sleep apnea.  She occasionally leaves CPAP off.  The thermostat in her home has not worked well.this winter, and she says when the house gets hot her CPAP is uncomfortable.  She is not going outdoors much because of back pain, so she has not had much allergy trouble this spring.  She dislikes CPAP because of the cost, the mask, and dealing with [facility].  03/21/2009 - OSA, hx allergic rhinitis She stopped using cpap when she moved. Alone- no one comments on snoring. Her original study done when she weighed 16 lbs less, gave RDI 42 and good cpap control at 11.  Mar 15, 2010- OSA, hx allergic rhinitis Last year she had dropped off CPAP and we had tried to get her motivated to restart. Also, she had moved out of an old house- hopefully reducing dust exposure. She hasn't tried CPAP since last here, but says that she feels she should. She has gotten her days and nights mixed up, trying to avoid going to bed. then sleeps late in the mornings. She dropped off trazodone and replaced it with Tylenol PM which does help some with insomnia.  Allergies with Spring time despite new encasing on mattress. Rhinorhea and coughs up some mucus in AM. C/O dry skin.   Anticoagulation Management History:      Positive risk factors for bleeding include an age of 74 years or older and presence of serious comorbidities.  The bleeding index is 'intermediate risk'.  Positive CHADS2 values include History of HTN, Age > 34 years old, and History of Diabetes.     Current Medications (verified): 1)  Lisinopril-Hydrochlorothiazide 20-12.5 Mg  Tabs (Lisinopril-Hydrochlorothiazide) .... Take 1 By Mouth Once Daily 2)  Prilosec 20 Mg  Cpdr (Omeprazole) .... Take 1 Tablet By Mouth Once A Day 3)  Levoxyl 100 Mcg   Tabs (Levothyroxine Sodium) .... Take 1 By Mouth Once Daily 4)  Klor-Con M20 20 Meq  Tbcr (Potassium Chloride Crys Cr) .... Take 1 By Mouth Once Daily 5)  Aspirin 81 Mg  Tbec (Aspirin) .... Take 1 By Mouth Once Daily 6)  Folic Acid 400 Mcg Tabs (Folic Acid) .... 2 By Mouth Once Daily 7)  Cpap 11 [facility] 8)  Glucophage Xr 500 Mg  Tb24 (Metformin Hcl) .... 4 Tabs Qam 9)  Pravastatin Sodium 40 Mg Tabs (Pravastatin Sodium) .... Qhs 10)  Nystatin 100000 Unit/gm Powd (Nystatin) .... Use On Affected Skin Three Times A Day As Needed  Allergies (verified): 1)  ! Penicillin 2)  ! * Soy (?)  Past History:  Past Medical History: Last updated: 2009/03/21 Diabetes mellitus, type II GERD Hypertension Hypothyroidism Osteoarthritis Osteoporosis Renal insufficiency dysthymic Disorder Hiatal Hernia DVT Left Leg Mild Anemia Homocysteinemia Sleep Apnea- 05/27/02 NPSG 42/hr, cpap 11 RDI 5/hr Allergic rhinitis  Past Surgical History: Last updated: 02/07/2008 EGD (07/03/2006) Lower Venous (01/24/2005) Stress Cardiolite (02/12/2003) EKG (01/02/2007) DEXA (08/2005)  Family History: Last updated: 2009-03-21 Father- died coronary aneurysm Mother- had phlebitis and died of CVA  Social History: Last updated: 03-21-09 Not married Never smoked but had long passive smoke exposure Built new house  Risk Factors: Smoking Status: never (2009/03/21)  Review of Systems      See HPI  The patient denies anorexia, fever, weight loss, weight gain, vision loss, decreased hearing,  hoarseness, chest pain, syncope, dyspnea on exertion, peripheral edema, prolonged cough, headaches, hemoptysis, and severe indigestion/heartburn.    Vital Signs:  Patient profile:   77 year old female Height:      64 inches Weight:      222.13 pounds BMI:     38.27 O2 Sat:      98 % on Room air Pulse rate:   75 / minute BP sitting:   126 / 80  (left arm) Cuff size:   large  Vitals Entered By: Reynaldo Minium CMA (Mar 15, 2010 2:11 PM)  O2 Flow:  Room air  Physical Exam  Additional Exam:  General: A/Ox3; pleasant and cooperative, NAD, cheerfull and talkative, obese SKIN: no rash, lesions NODES: no lymphadenopathy HEENT: [place]/AT, EOM- WNL, Conjuctivae- clear, PERRLA, TM-WNL, Nose- clear, Throat- clear and wnl, Mallampati  III NECK: Supple w/ fair ROM, JVD- none, normal carotid impulses w/o bruits Thyroid- normal to palpation CHEST: Clear to P&A HEART: RRR, no m/g/r heard ABDOMEN: Soft and nl; ZOX:WRUE, nl pulses, no edema  NEURO: Grossly intact to observation       Impression & Recommendations:  Problem # 1:  Hx of OBSTRUCTIVE SLEEP APNEA (ICD-327.23)  Her motivation is not there to restart CPAP- hopefully she will try. Weight loss is advised but unlikely to happen  Problem # 2:  ALLERGIC RHINITIS (ICD-477.9)  The best option would be occasional use of an otc antihistamine if it doesn't dry her skin too much.  Other Orders: Est. Patient Level III (45409) DME Referral (DME)  Anticoagulation Management Assessment/Plan:            Patient Instructions: 1)  Please schedule a follow-up appointment in 6 months. 2)  Best sleep usually comes with a regular schedule,and a quiet, cool, dark bedroom. Let yourself wind-down by reading a little just before lights-out. 3)  We will ask [facility] to recheck your cpap for service check.

## 2010-12-16 NOTE — Assessment & Plan Note (Signed)
Summary: rov/jd   Primary Lyrick Lagrand/Referring Bretta Fees:  Everardo All  CC:  follow up visit-sleep apnea; using CPAP each night.  History of Present Illness: 3/26/09[facility] returns for follow-up of her sleep apnea.  She occasionally leaves CPAP off.  The thermostat in her home has not worked well.this winter, and she says when the house gets hot her CPAP is uncomfortable.  She is not going outdoors much because of back pain, so she has not had much allergy trouble this spring.  She dislikes CPAP because of the cost, the mask, and dealing with [facility].  03/16/09 - OSA, hx allergic rhinitis She stopped using cpap when she moved. Alone- no one comments on snoring. Her original study done when she weighed 16 lbs less, gave RDI 42 and good cpap control at 11.  Mar 15, 2010- OSA, hx allergic rhinitis Last year she had dropped off CPAP and we had tried to get her motivated to restart. Also, she had moved out of an old house- hopefully reducing dust exposure. She hasn't tried CPAP since last here, but says that she feels she should. She has gotten her days and nights mixed up, trying to avoid going to bed. then sleeps late in the mornings. She dropped off trazodone and replaced it with Tylenol PM which does help some with insomnia.  Allergies with Spring time despite new encasing on mattress. Rhinorhea and coughs up some mucus in AM. C/O dry skin  September 17, 2010- OSA, hx allergic rhinitis Nurse-CC:-follow up visit-sleep apnea; using CPAP each night C/O arthritc problems in neck. Singing again in choir.  She resumed using CPAP at 11. She gets irritated and wakes feeling frightened by claustophobia with full face mask. Bothersome dry nose- wasn't using the humidifier.       Preventive Screening-Counseling & Management  Alcohol-Tobacco     Smoking Status: never  Current Medications (verified): 1)  Lisinopril-Hydrochlorothiazide 20-12.5 Mg  Tabs (Lisinopril-Hydrochlorothiazide)  .... Take 1 By Mouth Once Daily 2)  Prilosec 20 Mg  Cpdr (Omeprazole) .... Take 1 Tablet By Mouth Once A Day 3)  Levoxyl 100 Mcg  Tabs (Levothyroxine Sodium) .... Take 1 By Mouth Once Daily 4)  Klor-Con M20 20 Meq  Tbcr (Potassium Chloride Crys Cr) .... Take 1 By Mouth Once Daily 5)  Aspirin 81 Mg  Tbec (Aspirin) .... Take 1 By Mouth Once Daily 6)  Folic Acid 400 Mcg Tabs (Folic Acid) .... 2 By Mouth Once Daily 7)  Cpap 11 [facility] 8)  Glucophage Xr 500 Mg  Tb24 (Metformin Hcl) .... 4 Tabs Qam 9)  Pravastatin Sodium 40 Mg Tabs (Pravastatin Sodium) .... Qhs 10)  Bromocriptine Mesylate 2.5 Mg Tabs (Bromocriptine Mesylate) .... 1/2 Tab At Bedtime 11)  Meloxicam 7.5 Mg Tabs (Meloxicam) .... Take 1 By Mouth Once Daily 12)  Caltrate 600+d 600-400 Mg-Unit Tabs (Calcium Carbonate-Vitamin D) .Marland Kitchen.. 1 By Mouth Two Times A Day 13)  Vitamin D 1000 Unit Tabs (Cholecalciferol) .Marland Kitchen.. 1 By Mouth Once Daily 14)  Multivitamins  Tabs (Multiple Vitamin) .... Use As Needed 15)  Perfect Iron 25 Mg Tabs (Carbonyl Iron) .... Take As Needed 16)  Prodigy Voice Blood Glucose W/device Kit (Blood Glucose Monitoring Suppl) .... Use As Directed 17)  Prodigy No Coding Blood Gluc  Strp (Glucose Blood) .... Use Two Times A Day Check Blood Sugar Dx Code: 250.00 18)  Prodigy Lancets 28g  Misc (Lancets) .... Use Two Times A Day Dx 250.00  Allergies (verified): 1)  ! Penicillin  2)  ! * Soy (?)  Past History:  Past Medical History: Last updated: 08/26/2010 Diabetes mellitus, type II GERD Hypertension Hypothyroidism Osteoarthritis    Osteoporosis Renal insufficiency dysthymic Disorder Hiatal Hernia DVT Left Leg Mild Anemia Homocysteinemia Sleep Apnea- 05/27/02 NPSG 42/hr, cpap 11 RDI 5/hr Allergic rhinitis  Past Surgical History: Last updated: 02/07/2008 EGD (07/03/2006) Lower Venous (01/24/2005) Stress Cardiolite (02/12/2003) EKG (01/02/2007) DEXA (08/2005)  Family History: Last updated: Apr 15, 2009 Father- died  coronary aneurysm Mother- had phlebitis and died of CVA  Social History: Last updated: 04/30/2010 Not married Never smoked but had long passive smoke exposure. no alcohol or ilicit drugs Built new house lives alone  Risk Factors: Smoking Status: never (09/17/2010)  Review of Systems      See HPI  The patient denies shortness of breath with activity, shortness of breath at rest, productive cough, non-productive cough, coughing up blood, chest pain, irregular heartbeats, acid heartburn, indigestion, loss of appetite, weight change, abdominal pain, difficulty swallowing, sore throat, tooth/dental problems, headaches, nasal congestion/difficulty breathing through nose, sneezing, itching, ear ache, rash, change in color of mucus, and fever.    Vital Signs:  Patient profile:   77 year old female Height:      64 inches Weight:      219.13 pounds BMI:     37.75 O2 Sat:      95 % on Room air Pulse rate:   103 / minute BP sitting:   152 / 86  (left arm) Cuff size:   regular  Vitals Entered By: Reynaldo Minium CMA (September 17, 2010 4:51 PM)  O2 Flow:  Room air CC: follow up visit-sleep apnea; using CPAP each night   Physical Exam  Additional Exam:  General: A/Ox3; pleasant and cooperative, NAD, cheerfull and talkative, obese SKIN: no rash, lesions NODES: no lymphadenopathy HEENT: [place]/AT, EOM- WNL, Conjuctivae- clear, PERRLA, TM-WNL, Nose- clear- minimal dry crust, Throat- clear and wnl, Mallampati  III NECK: Supple w/ fair ROM, JVD- none, normal carotid impulses w/o bruits Thyroid- normal to palpation CHEST: Clear to P&A HEART: RRR, no m/g/r heard ABDOMEN: Soft and nl; ZOX:WRUE, nl pulses, no edema  NEURO: Grossly intact to observation       Impression & Recommendations:  Problem # 1:  Hx of OBSTRUCTIVE SLEEP APNEA (ICD-327.23)  She is trying to use CPAP but frustrated by dryness and by mask comfort. We will have her try nasal saline spray/ rinse and have the DME show her  a nasal pillows mask and review humidifier use.   Problem # 2:  ALLERGIC RHINITIS (ICD-477.9) Main complaint is nasal drynessnow as noted w/out infection, sneezing or itching. Hydration is first priority.  Other Orders: Est. Patient Level III (45409) DME Referral (DME)  Patient Instructions: 1)  Please schedule a follow-up appointment in 6 months. 2)  Try nasal saline rinse or spray 3)  We will ask [facility] to get in touch with you about more comfortable CPAP masks and the humidifier.

## 2010-12-16 NOTE — Progress Notes (Signed)
Summary: Prodigy meter  Phone Note From Pharmacy   Caller: [facility]  [address]* Summary of Call: Pharmacy called back stating that pt's Insurance dis not cover Prodigy meter or testing supplies. I also was advised by pharmacy thatthey did not know of nay other meter that can speak CBG results as pt requested.  I called pt to advise and offer alternative eters but she said she would contact the pharmacy and see if she can pay out of pocket for meter, if not she will call back for alternative.  *See previous phone note* Initial call taken by: Margaret Pyle, CMA,  September 01, 2010 8:20 AM

## 2010-12-16 NOTE — Assessment & Plan Note (Signed)
Summary: DR SAE PT/NO SLOT--BP:  180/80--STC   Vital Signs:  Patient profile:   77 year old female Height:      64 inches (162.56 cm) Weight:      213.8 pounds (97.18 kg) O2 Sat:      97 % on Room air Temp:     97.5 degrees F (36.39 degrees C) oral Pulse rate:   89 / minute BP sitting:   148 / 78  (left arm) Cuff size:   large  Vitals Entered By: Orlan Leavens RMA (August 26, 2010 11:34 AM)  O2 Flow:  Room air CC: Elevated BP Is Patient Diabetic? Yes Did you bring your meter with you today? No Pain Assessment Patient in pain? yes     Location: back, hand,shoulder Type: aching Comments Pt states been seeing chiropractor for her back & shoulder pain. BP has been running high 180/80. Req rx for BS monitor   Primary Care Analleli Gierke:  Everardo All  CC:  Elevated BP.  History of Present Illness: HTN - normally reasonable control but elev BP yest while at chriopractor's office denies missed doses or med changes - elev BP exac by pain (seeing chiropractor for neck and shoulder pain) no CP or HA - no change UOP pain symptoms improved with use of meloxicam (prev rx by ortho for hip pain)  ?re: approp doing Ca + vit d for osteoporosis  Clinical Review Panels:  Lipid Management   Cholesterol:  134 (04/30/2010)   LDL (bad choesterol):  62 (04/30/2010)   HDL (good cholesterol):  46.60 (04/30/2010)   Triglycerides:  95 (10/02/2006)  Diabetes Management   HgBA1C:  7.2 (04/30/2010)   Creatinine:  1.0 (04/30/2010)   Last Flu Vaccine:  Fluvax 3+ (08/26/2010)   Last Pneumovax:  Pneumovax (10/14/2005)  Complete Metabolic Panel   Glucose:  117 (04/30/2010)   Sodium:  141 (04/30/2010)   Potassium:  4.4 (04/30/2010)   Chloride:  107 (04/30/2010)   CO2:  27 (04/30/2010)   BUN:  34 (04/30/2010)   Creatinine:  1.0 (04/30/2010)   Albumin:  4.2 (10/05/2009)   Total Protein:  7.1 (10/05/2009)   Calcium:  9.2 (04/30/2010)   Total Bili:  0.7 (10/05/2009)   Alk Phos:  51 (10/05/2009)  SGPT (ALT):  12 (10/05/2009)   SGOT (AST):  21 (10/05/2009)   Current Medications (verified): 1)  Lisinopril-Hydrochlorothiazide 20-12.5 Mg  Tabs (Lisinopril-Hydrochlorothiazide) .... Take 1 By Mouth Once Daily 2)  Prilosec 20 Mg  Cpdr (Omeprazole) .... Take 1 Tablet By Mouth Once A Day 3)  Levoxyl 100 Mcg  Tabs (Levothyroxine Sodium) .... Take 1 By Mouth Once Daily 4)  Klor-Con M20 20 Meq  Tbcr (Potassium Chloride Crys Cr) .... Take 1 By Mouth Once Daily 5)  Aspirin 81 Mg  Tbec (Aspirin) .... Take 1 By Mouth Once Daily 6)  Folic Acid 400 Mcg Tabs (Folic Acid) .... 2 By Mouth Once Daily 7)  Cpap 11 [facility] 8)  Glucophage Xr 500 Mg  Tb24 (Metformin Hcl) .... 4 Tabs Qam 9)  Pravastatin Sodium 40 Mg Tabs (Pravastatin Sodium) .... Qhs 10)  Nystatin 100000 Unit/gm Powd (Nystatin) .... Use On Affected Skin Three Times A Day As Needed 11)  Bromocriptine Mesylate 2.5 Mg Tabs (Bromocriptine Mesylate) .... 1/2 Tab At Bedtime 12)  Meloxicam 7.5 Mg Tabs (Meloxicam) .... Take 1 By Mouth Once Daily 13)  Calcium 600 Mg Tabs (Calcium) .... Take As Needed 14)  Vitamin D 1000 Unit Tabs (Cholecalciferol) .... Use As Needed 15)  Multivitamins  Tabs (Multiple Vitamin) .... Use As Needed 16)  Perfect Iron 25 Mg Tabs (Carbonyl Iron) .... Take As Needed  Allergies (verified): 1)  ! Penicillin 2)  ! * Soy (?)  Past History:  Past Medical History: Diabetes mellitus, type II GERD Hypertension Hypothyroidism Osteoarthritis    Osteoporosis Renal insufficiency dysthymic Disorder Hiatal Hernia DVT Left Leg Mild Anemia Homocysteinemia Sleep Apnea- 05/27/02 NPSG 42/hr, cpap 11 RDI 5/hr Allergic rhinitis  Review of Systems  The patient denies fever, weight loss, chest pain, peripheral edema, headaches, and abdominal pain.    Physical Exam  General:  overweight-appearing.  alert, well-developed, well-nourished, and cooperative to examination.    Lungs:  normal respiratory effort, no intercostal  retractions or use of accessory muscles; normal breath sounds bilaterally - no crackles and no wheezes.    Heart:  normal rate, regular rhythm, no murmur, and no rub. BLE without edema.   Impression & Recommendations:  Problem # 1:  HYPERTENSION (ICD-401.9) general BP trend reviewed - suspect elev yest due to pain symptoms will not change meds at this time - cont same and monitor, f/u PCP as planned Her updated medication list for this problem includes:    Lisinopril-hydrochlorothiazide 20-12.5 Mg Tabs (Lisinopril-hydrochlorothiazide) .Marland Kitchen... Take 1 by mouth once daily  BP today: 148/78 Prior BP: 134/82 (05/31/2010)  Labs Reviewed: K+: 4.4 (04/30/2010) Creat: : 1.0 (04/30/2010)   Chol: 134 (04/30/2010)   HDL: 46.60 (04/30/2010)   LDL: 62 (04/30/2010)   TG: 127.0 (04/30/2010)  Problem # 2:  OSTEOPOROSIS (ICD-733.00) rec given for doing on daily Calcium and vit d -  would need to review dexa with PCP to determine if "shots" needed to help abosrb the Calcium Discussed medication use, applications of heat or ice, and exercises.   Problem # 3:  OSTEOARTHRITIS (ICD-715.90) suspect DDD in cervical and lumbar region can be exac pain symptoms - seeing chiropractor for same - poss meloxicam has adv effect on BP but no edema on exam - consider need for muscle relaxants -  to help spasm pain- or ongoing physical maniputlation if pt prefers -  Her updated medication list for this problem includes:    Aspirin 81 Mg Tbec (Aspirin) .Marland Kitchen... Take 1 by mouth once daily    Meloxicam 7.5 Mg Tabs (Meloxicam) .Marland Kitchen... Take 1 by mouth once daily  Time spent with patient 25 minutes, more than 50% of this time was spent counseling patient on hypertension, osteoporosis, DDD and medication review including med mgmt for each condition  Complete Medication List: 1)  Lisinopril-hydrochlorothiazide 20-12.5 Mg Tabs (Lisinopril-hydrochlorothiazide) .... Take 1 by mouth once daily 2)  Prilosec 20 Mg Cpdr (Omeprazole)  .... Take 1 tablet by mouth once a day 3)  Levoxyl 100 Mcg Tabs (Levothyroxine sodium) .... Take 1 by mouth once daily 4)  Klor-con M20 20 Meq Tbcr (Potassium chloride crys cr) .... Take 1 by mouth once daily 5)  Aspirin 81 Mg Tbec (Aspirin) .... Take 1 by mouth once daily 6)  Folic Acid 400 Mcg Tabs (Folic acid) .... 2 by mouth once daily 7)  Cpap 11 [facility]  8)  Glucophage Xr 500 Mg Tb24 (Metformin hcl) .... 4 tabs qam 9)  Pravastatin Sodium 40 Mg Tabs (Pravastatin sodium) .... Qhs 10)  Nystatin 100000 Unit/gm Powd (Nystatin) .... Use on affected skin three times a day as needed 11)  Bromocriptine Mesylate 2.5 Mg Tabs (Bromocriptine mesylate) .... 1/2 tab at bedtime 12)  Meloxicam 7.5 Mg Tabs (Meloxicam) .... Take 1  by mouth once daily 13)  Caltrate 600+d 600-400 Mg-unit Tabs (Calcium carbonate-vitamin d) .Marland Kitchen.. 1 by mouth two times a day 14)  Vitamin D 1000 Unit Tabs (Cholecalciferol) .Marland Kitchen.. 1 by mouth once daily 15)  Multivitamins Tabs (Multiple vitamin) .... Use as needed 16)  Perfect Iron 25 Mg Tabs (Carbonyl iron) .... Take as needed 17)  Prodigy Voice Blood Glucose W/device Kit (Blood glucose monitoring suppl) .... Use as directed 18)  Prodigy No Coding Blood Gluc Strp (Glucose blood) .... Use two times a day check blood sugar dx code: 250.00 19)  Prodigy Lancets 28g Misc (Lancets) .... Use two times a day dx 250.00  Other Orders: Flu Vaccine 64yrs + MEDICARE PATIENTS (O5366) Administration Flu vaccine - MCR (Y4034)  Patient Instructions: 1)  it was good to see you today. 2)  medicatione reviewed - no changed in prescription blood pressure pills recommended - 3)  continue pain pills as ongoing by orthopedist 4)  take Calcium 1200mg /day and Vit D 1000-2000unit/day - rec caltrate and vit d tabs as listed below 5)  flu shot done today 6)  Please keep scheduled follow-up appointment with dr. Everardo All as planned, call sooner if problems.  Prescriptions: PRODIGY LANCETS 28G  MISC  (LANCETS) use two times a day dx 250.00  #60 x 5   Entered by:   Orlan Leavens RMA   Authorized by:   Newt Lukes MD   Signed by:   Orlan Leavens RMA on 08/26/2010   Method used:   Electronically to        [facility]  [address] (retail)       [address]       [place], [place]  [address]       Ph: [phone]       Fax: [phone]   RxID:   [id number] PRODIGY NO CODING BLOOD GLUC  STRP (GLUCOSE BLOOD) use two times a day check blood sugar dx code: 250.00  #60 x 5   Entered by:   Orlan Leavens RMA   Authorized by:   Newt Lukes MD   Signed by:   Orlan Leavens RMA on 08/26/2010   Method used:   Electronically to        [facility]  [address] (retail)       [address]       [place], [place]  [address]       Ph: [phone]       Fax: [phone]   RxID:   [id number] PRODIGY VOICE BLOOD GLUCOSE W/DEVICE KIT (BLOOD GLUCOSE MONITORING SUPPL) use as directed  #1 x 0   Entered by:   Orlan Leavens RMA   Authorized by:   Newt Lukes MD   Signed by:   Orlan Leavens RMA on 08/26/2010   Method used:   Electronically to        [facility]  [address] (retail)       [address]       [place], [place]  [address]       Ph: [phone]       Fax: [phone]   RxID:   [id number]  Flu Vaccine Consent Questions     Do you have a history of severe allergic reactions to this vaccine? no    Any prior history of allergic reactions to  egg and/or gelatin? no    Do you have a sensitivity to the preservative Thimersol? no    Do you have a past history of Guillan-Barre Syndrome? no    Do you currently have an acute febrile illness? no    Have you ever had a severe reaction to latex? no    Vaccine information given and explained to patient? yes    Are you currently pregnant? no    Lot Number:[id number]   Exp Date:05/14/2011   Site Given  Left Deltoid IM       [place], [place]  [address]       Ph: [phone]       Fax: [phone]   RxID:    [id number]   .[email or web address]

## 2010-12-16 NOTE — Letter (Signed)
Summary: [facility]   Imported By: Sherian Rein 07/08/2010 14:42:59  _____________________________________________________________________  External Attachment:    Type:   Image     Comment:   External Document

## 2010-12-16 NOTE — Progress Notes (Signed)
Summary: Rx change  Phone Note From Pharmacy   Caller: [facility]  [address]* Summary of Call: Pharmacy called requesting to change Rx for prodigy device and test supplies. Per pharmacist Prodigy is covered for Medicaid pt only. Ok to change to [employer] and Rx? Initial call taken by: Margaret Pyle, CMA,  August 30, 2010 10:44 AM  Follow-up for Phone Call        yes - ok to change - thanks Follow-up by: Newt Lukes MD,  August 30, 2010 12:20 PM  Additional Follow-up for Phone Call Additional follow up Details #1::        Contacted pharmacy and advised that pt was Rxd Prodigy becausse she needed a glucometer that could verbally give results. Pharmacist stated she would try to to get pt [employer] to approve and call back if they needed a new Rx. Additional Follow-up by: Margaret Pyle, CMA,  August 31, 2010 9:03 AM

## 2011-01-07 ENCOUNTER — Ambulatory Visit (INDEPENDENT_AMBULATORY_CARE_PROVIDER_SITE_OTHER): Payer: Medicare Other | Admitting: Internal Medicine

## 2011-01-07 ENCOUNTER — Encounter: Payer: Self-pay | Admitting: Internal Medicine

## 2011-01-07 DIAGNOSIS — J209 Acute bronchitis, unspecified: Secondary | ICD-10-CM

## 2011-01-07 DIAGNOSIS — J449 Chronic obstructive pulmonary disease, unspecified: Secondary | ICD-10-CM | POA: Insufficient documentation

## 2011-01-07 DIAGNOSIS — G4733 Obstructive sleep apnea (adult) (pediatric): Secondary | ICD-10-CM

## 2011-01-07 DIAGNOSIS — J309 Allergic rhinitis, unspecified: Secondary | ICD-10-CM

## 2011-01-20 NOTE — Assessment & Plan Note (Signed)
Summary: OV BRONCHITIS//SH   Primary Provider/Referring Provider:  Everardo All  CC:  ? Bronchitis flare up; cough-yellow in color x 2 weeks approx. Denies any SOB, Wheezing, Fever/chills, N&V, and or diarrhea..  History of Present Illness:  Mar 15, 2010- OSA, hx allergic rhinitis Last year she had dropped off CPAP and we had tried to get her motivated to restart. Also, she had moved out of an old house- hopefully reducing dust exposure. She hasn't tried CPAP since last here, but says that she feels she should. She has gotten her days and nights mixed up, trying to avoid going to bed. then sleeps late in the mornings. She dropped off trazodone and replaced it with Tylenol PM which does help some with insomnia.  Allergies with Spring time despite new encasing on mattress. Rhinorhea and coughs up some mucus in AM. C/O dry skin  September 17, 2010- OSA, hx allergic rhinitis Nurse-CC:-follow up visit-sleep apnea; using CPAP each night C/O arthritc problems in neck. Singing again in choir.  She resumed using CPAP at 13. She gets irritated and wakes feeling frightened by claustophobia with full face mask. Bothersome dry nose- wasn't using the humidifier.   January 07, 2011-  OSA, hx allergic rhinitis Nurse-CC: ? Bronchitis flare up; cough-yellow in color x 2 weeks approx. Denies any SOB, Wheezing, Fever/chills, N&V, or diarrhea. Acute x 2 weeks with mostly just increased cough and yellow phlegm. Blood sugar higher. also nasal congestion and blowing.  CPAP 11- has gotten less regular since coughing more at night.      Preventive Screening-Counseling & Management  Alcohol-Tobacco     Smoking Status: never  Current Medications (verified): 1)  Lisinopril-Hydrochlorothiazide 20-12.5 Mg  Tabs (Lisinopril-Hydrochlorothiazide) .... Take 1 By Mouth Once Daily 2)  Prilosec 20 Mg  Cpdr (Omeprazole) .... Take 1 Tablet By Mouth Once A Day 3)  Levoxyl 100 Mcg  Tabs (Levothyroxine Sodium) .... Take 1 By  Mouth Once Daily 4)  Klor-Con M20 20 Meq  Tbcr (Potassium Chloride Crys Cr) .... Take 1 By Mouth Once Daily 5)  Aspirin 81 Mg  Tbec (Aspirin) .... Take 1 By Mouth Once Daily 6)  Folic Acid 400 Mcg Tabs (Folic Acid) .... 2 By Mouth Once Daily 7)  Cpap 11 [facility] 8)  Glucophage Xr 500 Mg  Tb24 (Metformin Hcl) .... 4 Tabs Qam 9)  Pravastatin Sodium 40 Mg Tabs (Pravastatin Sodium) .... Qhs 10)  Bromocriptine Mesylate 2.5 Mg Tabs (Bromocriptine Mesylate) .... 1/2 Tab At Bedtime 11)  Meloxicam 7.5 Mg Tabs (Meloxicam) .... Take 1 By Mouth Once Daily 12)  Caltrate 600+d 600-400 Mg-Unit Tabs (Calcium Carbonate-Vitamin D) .Marland Kitchen.. 1 By Mouth Two Times A Day 13)  Vitamin D 1000 Unit Tabs (Cholecalciferol) .Marland Kitchen.. 1 By Mouth Once Daily 14)  Multivitamins  Tabs (Multiple Vitamin) .... Use As Needed 15)  Perfect Iron 25 Mg Tabs (Carbonyl Iron) .... Take As Needed 16)  Prodigy Voice Blood Glucose W/device Kit (Blood Glucose Monitoring Suppl) .... Use As Directed 17)  Prodigy No Coding Blood Gluc  Strp (Glucose Blood) .... Use Two Times A Day Check Blood Sugar Dx Code: 250.00 18)  Prodigy Lancets 28g  Misc (Lancets) .... Use Two Times A Day Dx 250.00  Allergies (verified): 1)  ! Penicillin 2)  ! * Soy (?)  Past History:  Past Medical History: Last updated: 08/26/2010 Diabetes mellitus, type II GERD Hypertension Hypothyroidism Osteoarthritis    Osteoporosis Renal insufficiency dysthymic Disorder Hiatal Hernia DVT Left Leg Mild Anemia Homocysteinemia  Sleep Apnea- 05/27/02 NPSG 42/hr, cpap 11 RDI 5/hr Allergic rhinitis  Past Surgical History: Last updated: 02/07/2008 EGD (07/03/2006) Lower Venous (01/24/2005) Stress Cardiolite (02/12/2003) EKG (01/02/2007) DEXA (08/2005)  Family History: Last updated: 03/17/2009 Father- died coronary aneurysm Mother- had phlebitis and died of CVA  Social History: Last updated: 04/30/2010 Not married Never smoked but had long passive smoke exposure. no  alcohol or ilicit drugs Built new house lives alone  Risk Factors: Smoking Status: never (01/07/2011)  Review of Systems      See HPI       The patient complains of shortness of breath with activity, productive cough, and nasal congestion/difficulty breathing through nose.  The patient denies shortness of breath at rest, non-productive cough, coughing up blood, chest pain, irregular heartbeats, acid heartburn, indigestion, loss of appetite, weight change, abdominal pain, difficulty swallowing, sore throat, tooth/dental problems, headaches, sneezing, itching, ear ache, rash, change in color of mucus, and fever.    Vital Signs:  Patient profile:   77 year old female Height:      64 inches Weight:      214.13 pounds BMI:     36.89 O2 Sat:      94 % on Room air Pulse rate:   92 / minute BP sitting:   142 / 72  (left arm) Cuff size:   large  Vitals Entered By: Abigail Miyamoto RN (January 07, 2011 9:58 AM)  O2 Flow:  Room air CC: ? Bronchitis flare up; cough-yellow in color x 2 weeks approx. Denies any SOB, Wheezing, Fever/chills, N&V, or diarrhea.   Physical Exam  Additional Exam:  General: A/Ox3; pleasant and cooperative, NAD, cheerfull and talkative, obese SKIN: no rash, lesions NODES: no lymphadenopathy HEENT: [place]/AT, EOM- WNL, Conjuctivae- clear, PERRLA, TM-WNL, Nose- clear- minimal dry crust, Throat- clear and wnl, Mallampati  III NECK: Supple w/ fair ROM, JVD- none, normal carotid impulses w/o bruits Thyroid- normal to palpation CHEST: Clear to P&A, mild raspy cough HEART: RRR, no m/g/r heard ABDOMEN: Soft and nl; ZOX:WRUE, nl pulses, no edema  NEURO: Grossly intact to observation       Impression & Recommendations:  Problem # 1:  Hx of OBSTRUCTIVE SLEEP APNEA (ICD-327.23)  I encouraged her to continue regular use of her CPAP- should be easier as her chest gets to feel better.   Problem # 2:  ALLERGIC RHINITIS (ICD-477.9)  Mild rhinitis- viral vs allergy. We will  see how she responds to Z pak.   Problem # 3:  ACUTE BRONCHITIS (ICD-466.0)  We will offer a Zpak. Discussed aggravating factors, and use of fluids, throat lozenges.  Her updated medication list for this problem includes:    Zithromax Z-pak 250 Mg Tabs (Azithromycin) .Marland Kitchen... 2 today then one daily  Medications Added to Medication List This Visit: 1)  Zithromax Z-pak 250 Mg Tabs (Azithromycin) .... 2 today then one daily  Other Orders: Est. Patient Level III (45409)  Patient Instructions: 1)  Please schedule a follow-up appointment in 6 months. Please call sooner as needed.  2)  Script for Z pak sent to drug store 3)  Please keep using the CPAP.  Prescriptions: ZITHROMAX Z-PAK 250 MG TABS (AZITHROMYCIN) 2 today then one daily  #1 x 0   Entered and Authorized by:   Waymon Budge MD   Signed by:   Waymon Budge MD on 01/07/2011   Method used:   Electronically to        [facility]  [address] [facility] (retail)  [address]       [place], [place]  [address]       Ph: [phone]       Fax: [phone]   RxID:   [id number]

## 2011-02-15 ENCOUNTER — Other Ambulatory Visit (INDEPENDENT_AMBULATORY_CARE_PROVIDER_SITE_OTHER): Payer: Medicare Other | Admitting: Endocrinology

## 2011-02-15 DIAGNOSIS — I10 Essential (primary) hypertension: Secondary | ICD-10-CM

## 2011-02-15 DIAGNOSIS — E7800 Pure hypercholesterolemia, unspecified: Secondary | ICD-10-CM

## 2011-04-01 NOTE — Assessment & Plan Note (Signed)
[facility]                           GASTROENTEROLOGY OFFICE NOTE   MARCHELE, DECOCK                      MRN:          [id number]  DATE:08/21/2006                            DOB:          1929/10/18    Ms. Dehaan returns after endoscopy and colonoscopy on July 03, 2006.  She  had a large 8 cm hiatal hernia and diverticulosis noted but no other  lesions.  She has a long history of recurrent erosive esophagitis and has  had previous iron deficiency anemia.  I did obtain small bowel biopsy at  time of her endoscopy that was normal.   Actually lab data from August 2007 showed normal CBC and metabolic profile.  She was borderline B12 deficient.  We gave her B12 parenteral replacement  therapy.   I have talked with Jola Babinski at length about her need to be on long term  Aciphex therapy for large hiatal hernia or continued reflux symptoms off of  therapy.  Because of her symptomatology, I think she should be on chronic  Aciphex, and I have given her samples today as she is having problems with  her medicare insurance policy at this time.  Also explained the reflux  regimen in detail and will see her in six to eight month intervals on a  p.r.n. basis as needed.       Vania Rea. Jarold Motto, MD, Clementeen [place], [place]      DRP/MedQ  DD:  08/21/2006  DT:  08/22/2006  Job #:  [id number]   cc:   Gregary Signs A. Everardo All, MD  Clinton D. Maple Hudson, MD, FCCP, FACP

## 2011-04-01 NOTE — Assessment & Plan Note (Signed)
[facility]                             PULMONARY OFFICE NOTE   Destiny Flowers, Destiny Flowers                      MRN:          [id number]  DATE:02/09/2007                            DOB:          February 27, 1929    PRIMARY CARE PHYSICIAN:  Dr. Romero Belling.   GI DOCTOR:  Sheryn Bison.   PROBLEM:  1. Bronchitis.  2. Allergic rhinitis.  3. History of obstructive sleep apnea.  4. Diabetes.  5. Hypertension.  6. GERD.   HISTORY:  Had been doing well through the winter but at Easter caught a  cold, has become a wheezy bronchitis, coughing yellow sputum. No fever,  no chest pain. She is not taking any respiratory medicines. Cough is  keeping her awake.   MEDICATIONS:  1. Lisinopril/hydrochlorothiazide 20/12.5 ( no chronic cough).  2. AcipHex 20 mg.  3. Levoxyl 0.1.  4. Trazodone 100 mg a bedtime.  5. Potassium 20 mEq.  6. Aspirin 81 mg.  7. Folic acid.  8. Calcium.  9. Actos 45 mg times one half.  10.Boniva.  11.Zetia 10 mg.   OBJECTIVE:  Blood pressure 118/74, pulse regular 79, room air saturation  99%.  Congested cough, breathing is unlabored. There is no dullness to  percussion. No pleural rub. Work or breathing is not increased.  HEART SOUNDS: Regular without murmur or gallop.  There is no neck vein distension or strider.  No peripheral edema or cyanosis.   IMPRESSION:  Acute bronchitis.   PLAN:  Nebulizer treatment, Xopenex 1.25 mg, Depo-Medrol 80 mg IM, Z-Pak  to hold after discussion. Schedule return 1 year, earlier p.r.n.     Clinton D. Maple Hudson, MD, Tonny Bollman, FACP  Electronically Signed    CDY/MedQ  DD: 02/09/2007  DT: 02/09/2007  Job #: [id number]

## 2011-04-01 NOTE — Assessment & Plan Note (Signed)
[facility]                           GASTROENTEROLOGY OFFICE NOTE   MAKEDA, PEEKS                      MRN:          [id number]  DATE:06/15/2006                            DOB:          Apr 09, 1929    Destiny Flowers really denies significant complaints at this time, taking daily  PPIs.  She has a history of ulcerative esophagitis, chronic anemia, guaiac-  positive stools, but strong family history of colon polyps in multiple  family members.  Last endoscopic exams have been in May of 2002.   She has a lot of recent blood work, but I cannot see any iron studies or CBC  and she has not returned Hemoccult cards.  She does take daily aspirin in  addition to other medications she listed and reviewed in her chart, in  addition to her Aciphex 20 mg a day.   She is a healthy-appearing white female appearing her stated age.  She  weighs 225 pounds and blood pressure is 142/74.  Pulse was 80 and regular.  I could not appreciate hepatosplenomegaly, abdominal masses or tenderness.  Bowel sounds were normal.  Rectal exam was deferred.   RECOMMENDATIONS:  1.  Repeat endoscopy and colonoscopy exams.  2.  Check CBC and iron studies.  3.  Continue Aciphex and reflux regimen.  4.  Other medications as per Dr. Everardo All, her primary care physician.  She      is diabetic and hypertensive and is on multiple medications, also being      followed for chronic thyroid dysfunction, hyperlipidemia, and possible      parathyroid dysfunction.                                   Vania Rea. Jarold Motto, MD, Clementeen [place], [place]   DRP/MedQ  DD:  06/15/2006  DT:  06/15/2006  Job #:  [id number]   cc:   Gregary Signs A. Everardo All, MD

## 2011-05-03 ENCOUNTER — Other Ambulatory Visit: Payer: Self-pay | Admitting: Endocrinology

## 2011-05-18 ENCOUNTER — Other Ambulatory Visit: Payer: Self-pay | Admitting: Endocrinology

## 2011-06-06 ENCOUNTER — Other Ambulatory Visit: Payer: Self-pay | Admitting: Endocrinology

## 2011-06-07 ENCOUNTER — Other Ambulatory Visit: Payer: Self-pay | Admitting: *Deleted

## 2011-06-07 DIAGNOSIS — E7800 Pure hypercholesterolemia, unspecified: Secondary | ICD-10-CM

## 2011-06-07 DIAGNOSIS — I10 Essential (primary) hypertension: Secondary | ICD-10-CM

## 2011-06-07 MED ORDER — PRAVASTATIN SODIUM 40 MG PO TABS: ORAL_TABLET | ORAL | Status: DC

## 2011-06-07 MED ORDER — LISINOPRIL-HYDROCHLOROTHIAZIDE 20-12.5 MG PO TABS: ORAL_TABLET | ORAL | Status: DC

## 2011-06-07 NOTE — Telephone Encounter (Signed)
R'cd fax from [facility] for refills of Pravastatin and Zestoretic  Last OV-05/31/2010

## 2011-06-27 ENCOUNTER — Other Ambulatory Visit (INDEPENDENT_AMBULATORY_CARE_PROVIDER_SITE_OTHER): Payer: Medicare Other

## 2011-06-27 ENCOUNTER — Encounter: Payer: Self-pay | Admitting: Internal Medicine

## 2011-06-27 ENCOUNTER — Encounter: Payer: Self-pay | Admitting: Endocrinology

## 2011-06-27 ENCOUNTER — Ambulatory Visit (INDEPENDENT_AMBULATORY_CARE_PROVIDER_SITE_OTHER): Payer: Medicare Other | Admitting: Internal Medicine

## 2011-06-27 VITALS — BP 126/76 | HR 79 | Temp 97.8°F | Ht 64.0 in | Wt 216.4 lb

## 2011-06-27 DIAGNOSIS — R42 Dizziness and giddiness: Secondary | ICD-10-CM

## 2011-06-27 DIAGNOSIS — E119 Type 2 diabetes mellitus without complications: Secondary | ICD-10-CM

## 2011-06-27 DIAGNOSIS — Z79899 Other long term (current) drug therapy: Secondary | ICD-10-CM

## 2011-06-27 DIAGNOSIS — R252 Cramp and spasm: Secondary | ICD-10-CM

## 2011-06-27 DIAGNOSIS — R5383 Other fatigue: Secondary | ICD-10-CM

## 2011-06-27 LAB — BASIC METABOLIC PANEL
GFR: 55.39 mL/min — ABNORMAL LOW (ref >60.00)
Potassium: 4.4 mEq/L (ref 3.5–5.1)
Sodium: 138 mEq/L (ref 135–145)

## 2011-06-27 LAB — HEPATIC FUNCTION PANEL
AST: 18 U/L (ref 0–37)
Albumin: 4 g/dL (ref 3.5–5.2)
Alkaline Phosphatase: 47 U/L (ref 39–117)

## 2011-06-27 LAB — CBC WITH DIFFERENTIAL/PLATELET
Eosinophils Relative: 2.7 % (ref 0.0–5.0)
HCT: 36 % (ref 36.0–46.0)
Hemoglobin: 12 g/dL (ref 12.0–15.0)
Lymphs Abs: 1.4 10*3/uL (ref 0.7–4.0)
Monocytes Relative: 10.6 % (ref 3.0–12.0)
Neutro Abs: 4 10*3/uL (ref 1.4–7.7)
Platelets: 245 10*3/uL (ref 150.0–400.0)
RBC: 4.12 Mil/uL (ref 3.87–5.11)
WBC: 6.3 10*3/uL (ref 4.5–10.5)

## 2011-06-27 LAB — HEMOGLOBIN A1C: Hgb A1c MFr Bld: 7.4 % — ABNORMAL HIGH (ref 4.6–6.5)

## 2011-06-27 LAB — TSH: TSH: 0.83 u[IU]/mL (ref 0.35–5.50)

## 2011-06-27 LAB — VITAMIN B12: Vitamin B-12: 295 pg/mL (ref 211–911)

## 2011-06-27 LAB — MAGNESIUM: Magnesium: 1.3 mg/dL — ABNORMAL LOW (ref 1.5–2.5)

## 2011-06-27 NOTE — Progress Notes (Signed)
  Subjective:    Patient ID: Destiny Flowers, female    DOB: 29-Aug-1929, 77 y.o.   MRN: [id number]  HPI complains of dizziness Onset 1 week ago symptoms light head are present constantly, worse with movement of head or change in body poisition No headache, fever or vision changes No chest pain during exertion, no syncope or palpitations Home bp and sugars ok - no hypoglycemia No med changes  Past Medical History  Diagnosis Date  . DIABETES MELLITUS, TYPE II 06/06/2007  . GERD 06/06/2007  . HYPERCHOLESTEROLEMIA 02/19/2008  . HYPERTENSION 06/06/2007  . HYPOTHYROIDISM 06/06/2007  . OSTEOPOROSIS 06/06/2007  . RENAL INSUFFICIENCY 06/06/2007  . Dysthymic disorder   . Homocysteinemia   . ALLERGIC RHINITIS   . OSTEOARTHRITIS   . INSOMNIA   . DVT (deep venous thrombosis)     Left leg  . OBSTRUCTIVE SLEEP APNEA     Review of Systems  Constitutional: Positive for fatigue.  Respiratory: Negative for shortness of breath.   Cardiovascular: Negative for chest pain.      Objective:   Physical Exam BP 126/76  Pulse 79  Temp(Src) 97.8 F (36.6 C) (Oral)  Ht 5\' 4"  (1.626 m)  Wt 216 lb 6.4 oz (98.158 kg)  BMI 37.14 kg/m2  SpO2 97% Constitutional: She is overweight. She appears well-developed and well-nourished. No distress.  HENT: Head: Normocephalic and atraumatic. Ears: B TMs ok, no erythema or effusion; Nose: Nose normal.  Mouth/Throat: Oropharynx is clear and moist. No oropharyngeal exudate.  Eyes: Conjunctivae and EOM are normal. Pupils are equal, round, and reactive to light. No scleral icterus.  Neck: Normal range of motion. Neck supple. No JVD present. No thyromegaly present.  Cardiovascular: Normal rate, regular rhythm and normal heart sounds.  No murmur heard. chronic L>R BLE edema without change. Pulmonary/Chest: Effort normal and breath sounds normal. No respiratory distress. She has no wheezes.  Neurological: She is alert and oriented to person, place, and time. No cranial nerve  deficit. Coordination normal. cognition and speech normal Psychiatric: She has a normal mood and affect. Her behavior is normal. Judgment and thought content normal.   Lab Results  Component Value Date   WBC 6.7 10/05/2009   HGB 13.0 10/05/2009   HCT 38.6 10/05/2009   PLT 240.0 10/05/2009   CHOL 134 04/30/2010   TRIG 127.0 04/30/2010   HDL 46.60 04/30/2010   ALT 12 10/05/2009   AST 21 10/05/2009   NA 141 04/30/2010   K 4.4 04/30/2010   CL 107 04/30/2010   CREATININE 1.0 04/30/2010   BUN 34* 04/30/2010   CO2 27 04/30/2010   TSH 1.13 10/05/2009   HGBA1C 7.2* 04/30/2010   MICROALBUR 1.5 04/30/2010      Assessment & Plan:  Dizziness - suspect BPPV but check labs rule out other med issues; hd and other vs stable, neuro and CV+ENT exam bengin - consider meclizine if labs ok  Muscle cramping - ongoing for 3-4 weeks, tx self with vinegar - check lytes, adjust if needed  Fatigue - pt requests b12 check with other labs

## 2011-06-27 NOTE — Patient Instructions (Signed)
It was good to see you today. Medications reviewed, no changes at this time. Test(s) ordered today. Your results will be called to you after review (48-72hours after test completion). If any changes need to be made, you will be notified at that time. Please schedule followup in 3-4 months with Dr. Everardo All, call sooner if problems.

## 2011-06-27 NOTE — Assessment & Plan Note (Signed)
On metformin + bromocript - no hx for hypoglycemia-  Check a1c now - f/u SAE as planned Lab Results  Component Value Date   HGBA1C 7.2* 04/30/2010

## 2011-06-29 ENCOUNTER — Other Ambulatory Visit: Payer: Self-pay | Admitting: Internal Medicine

## 2011-06-29 MED ORDER — MAGNESIUM OXIDE 400 MG PO TABS: 400.0000 mg | ORAL_TABLET | Freq: Every day | ORAL | Status: AC

## 2011-06-30 ENCOUNTER — Telehealth: Payer: Self-pay | Admitting: *Deleted

## 2011-06-30 NOTE — Telephone Encounter (Signed)
Pt came to office gave results concerning labs.Destiny KitchenMarland Kitchen8/15/12@1 :35pm/LMB

## 2011-07-04 ENCOUNTER — Ambulatory Visit: Payer: Medicare Other | Admitting: Internal Medicine

## 2011-07-09 ENCOUNTER — Other Ambulatory Visit: Payer: Self-pay | Admitting: Endocrinology

## 2011-07-14 ENCOUNTER — Encounter: Payer: Self-pay | Admitting: Internal Medicine

## 2011-07-14 ENCOUNTER — Ambulatory Visit (INDEPENDENT_AMBULATORY_CARE_PROVIDER_SITE_OTHER): Payer: Medicare Other | Admitting: Internal Medicine

## 2011-07-14 ENCOUNTER — Other Ambulatory Visit: Payer: Medicare Other

## 2011-07-14 VITALS — BP 118/70 | HR 76 | Ht 64.0 in | Wt 217.4 lb

## 2011-07-14 DIAGNOSIS — G4733 Obstructive sleep apnea (adult) (pediatric): Secondary | ICD-10-CM

## 2011-07-14 DIAGNOSIS — Z86718 Personal history of other venous thrombosis and embolism: Secondary | ICD-10-CM | POA: Insufficient documentation

## 2011-07-14 DIAGNOSIS — Z8672 Personal history of thrombophlebitis: Secondary | ICD-10-CM

## 2011-07-14 DIAGNOSIS — Z23 Encounter for immunization: Secondary | ICD-10-CM

## 2011-07-14 NOTE — Patient Instructions (Signed)
Order- D-dimer- history of DVT  Flu vaccine   Please take your [facility] mask to [facility] and see if they can help you get more comfortable. Our goal is to use it all night every night,

## 2011-07-14 NOTE — Progress Notes (Signed)
Subjective:    Patient ID: Destiny Flowers, female    DOB: [birth year], 77 y.o.   MRN: [id number]  HPI 07/14/11- 77 year old female with history of passive smoking, followed for obstructive sleep apnea, allergic rhinitis, complicated by hypothyroid, DM, HBP, renal insufficiency Last here January 07, 2011. CPAP 11- wakes and takes it off in sleep, feeling somebody has "got" her. Discussed going to the closer [facility] branch in [place] so that she can discuss mask fitting and machine issues directly with her home care company.. She feels confined by the mask,not smothered by the pressure..  Left leg- the side that had remote DVT- has noted a bit of painless swelling on dorsum of left foot.  Coughs a little clear mucus in the morning. Can't walk easily and asks for handicaped parking.   Review of Systems Constitutional:   No-   weight loss, night sweats, fevers, chills, fatigue, lassitude. HEENT:   No-  headaches, difficulty swallowing, tooth/dental problems, sore throat,       No-  sneezing, itching, ear ache, nasal congestion, post nasal drip,  CV:  No-   chest pain, orthopnea, PND, swelling in lower extremities, anasarca, dizziness, palpitations Resp: No- acute  shortness of breath with exertion or at rest.              No-   productive cough,  No non-productive cough,  No-  coughing up of blood.              No-   change in color of mucus.  No- wheezing.   Skin: No-   rash or lesions. GI:  No-   heartburn, indigestion, abdominal pain, nausea, vomiting, diarrhea,                 change in bowel habits, loss of appetite GU: No-   dysuria, change in color of urine, no urgency or frequency.  No- flank pain. MS:  No-   joint pain, + swelling.  No- decreased range of motion.  No- back pain. Neuro- grossly normal to observation, Or:  Psych:  No- change in mood or affect. No depression or anxiety.  No memory loss.      Objective:   Physical Exam  General- Alert, Oriented, Affect-appropriate,  Distress- none acute   Overweight  talkative Skin- rash-none, lesions- none, excoriation- none Lymphadenopathy- none Head- atraumatic            Eyes- Gross vision intact, PERRLA, conjunctivae clear secretions            Ears- Hearing, canals normal            Nose- Clear, No- Septal dev, mucus, polyps, erosion, perforation             Throat- Mallampati II , mucosa clear , drainage- none, tonsils- atrophic Neck- flexible , trachea midline, no stridor , thyroid nl, carotid no bruit Chest - symmetrical excursion , unlabored           Heart/CV- RRR , no murmur , no gallop  , no rub, nl s1 s2                           - JVD- none , +edema- trace, dorsum left foot, stasis changes- none, varices- none           Lung- clear to P&A, wheeze- none, cough- none , dullness-none, rub- none           Chest wall-  Abd- tender-no, distended-no, bowel sounds-present, HSM- no Br/ Gen/ Rectal- Not done, not indicated Extrem- cyanosis- none, clubbing, none, atrophy- none, strength- nl     Homan's negative.  Neuro- grossly intact to observation        Assessment & Plan:

## 2011-07-14 NOTE — Assessment & Plan Note (Signed)
She will take her disliked CPAP mask to [facility] to discuss alternatives. We discussed  Compliance and goals.

## 2011-07-14 NOTE — Assessment & Plan Note (Signed)
Minor edema with negative Homan's. We will get D-dimer. Low risk.

## 2011-07-15 ENCOUNTER — Telehealth: Payer: Self-pay | Admitting: *Deleted

## 2011-07-15 NOTE — Progress Notes (Signed)
Quick Note:  Spoke with patient-aware of results- phone note sent to CY about way patient is feeling. ______

## 2011-07-15 NOTE — Telephone Encounter (Signed)
She had told me she already had an appointment pending soon, so I intended to let them work her up.  She should try elevating her legs for part of each day.

## 2011-07-15 NOTE — Telephone Encounter (Signed)
Called, spoke with pt.  She is aware of CDY's recs and verbalized understanding of this.  Will call back if anything further is needed.

## 2011-07-15 NOTE — Telephone Encounter (Signed)
Pt states she is still having trouble with her leg and feet; swelling and uncomfortable. Destiny Flowers stated she has to keep moving her legs at night to get settled. She is calling her "leg/foot" doctor as well.

## 2011-07-28 ENCOUNTER — Ambulatory Visit (INDEPENDENT_AMBULATORY_CARE_PROVIDER_SITE_OTHER): Payer: Medicare Other | Admitting: Endocrinology

## 2011-07-28 ENCOUNTER — Encounter: Payer: Self-pay | Admitting: Endocrinology

## 2011-07-28 VITALS — BP 132/72 | HR 92 | Temp 98.1°F | Ht 63.0 in | Wt 214.0 lb

## 2011-07-28 DIAGNOSIS — E119 Type 2 diabetes mellitus without complications: Secondary | ICD-10-CM

## 2011-07-28 MED ORDER — BROMOCRIPTINE MESYLATE 2.5 MG PO TABS: 2.5000 mg | ORAL_TABLET | Freq: Every day | ORAL | Status: DC

## 2011-07-28 MED ORDER — TRAZODONE HCL 100 MG PO TABS: 100.0000 mg | ORAL_TABLET | Freq: Every day | ORAL | Status: DC

## 2011-07-28 MED ORDER — TRAMADOL-ACETAMINOPHEN 37.5-325 MG PO TABS: 1.0000 | ORAL_TABLET | Freq: Four times a day (QID) | ORAL | Status: AC | PRN

## 2011-07-28 NOTE — Patient Instructions (Addendum)
Please see a foot specialist.  However, you do no qualify for diabetes shoes. Refer to a diabetes education specialist.  you will receive a phone call, about a day and time for an appointment. Increase bromocriptine to 1 pill at bedtime.  i have sent a prescription to your pharmacy i have sent a prescription to your pharmacy, for sleep and pain medications. Please schedule a "medicare wellness" appointment.

## 2011-07-28 NOTE — Progress Notes (Signed)
Subjective:    Patient ID: Destiny Flowers, female    DOB: Mar 27, 1929, 77 y.o.   MRN: [id number]  HPI Pt states few years of moderate pain at the left foot, and assoc numbness.  No injury. Pt says she does not check cbg's.  She wants only generic meds. Past Medical History  Diagnosis Date  . DIABETES MELLITUS, TYPE II 06/06/2007  . GERD 06/06/2007  . HYPERCHOLESTEROLEMIA 02/19/2008  . HYPERTENSION 06/06/2007  . HYPOTHYROIDISM 06/06/2007  . OSTEOPOROSIS 06/06/2007  . RENAL INSUFFICIENCY 06/06/2007  . Dysthymic disorder   . Homocysteinemia   . ALLERGIC RHINITIS   . OSTEOARTHRITIS   . INSOMNIA   . DVT (deep venous thrombosis)     Left leg  . OBSTRUCTIVE SLEEP APNEA     No past surgical history on file.  History   Social History  . Marital Status: Single    Spouse Name: N/A    Number of Children: N/A  . Years of Education: N/A   Occupational History  . Not on file.   Social History Main Topics  . Smoking status: Passive Smoker  . Smokeless tobacco: Not on file  . Alcohol Use: No  . Drug Use: No  . Sexually Active:    Other Topics Concern  . Not on file   Social History Narrative  . No narrative on file    Current Outpatient Prescriptions on File Prior to Visit  Medication Sig Dispense Refill  . aspirin 81 MG tablet Take 81 mg by mouth daily.        . bromocriptine (PARLODEL) 2.5 MG tablet Take 1 tablet (2.5 mg total) by mouth at bedtime.  90 tablet  3  . Calcium Carbonate-Vitamin D (CALTRATE 600+D) 600-400 MG-UNIT per tablet Take 1 tablet by mouth 2 (two) times daily.        Blossom Hoops Iron (PERFECT IRON) 25 MG TABS Take by mouth as needed.        . Cholecalciferol (VITAMIN D3) 1000 UNITS CAPS Take by mouth daily.        . folic acid (FOLVITE) 400 MCG tablet Take 800 mcg by mouth daily.        Marland Kitchen KLOR-CON M20 20 MEQ tablet TAKE ONE TABLET BY MOUTH EVERY DAY  90 each  1  . levothyroxine (SYNTHROID, LEVOTHROID) 100 MCG tablet TAKE ONE TABLET BY MOUTH EVERY DAY  90 tablet   0  . lisinopril-hydrochlorothiazide (PRINZIDE,ZESTORETIC) 20-12.5 MG per tablet TAKE ONE TABLET BY MOUTH EVERY DAY  30 tablet  2  . magnesium oxide (MAG-OX 400) 400 MG tablet Take 1 tablet (400 mg total) by mouth daily.  200 tablet  1  . metFORMIN (GLUCOPHAGE-XR) 500 MG 24 hr tablet TAKE FOUR TABLETS BY MOUTH IN THE MORNING  360 tablet  0  . Multiple Vitamin (MULTIVITAMIN) tablet Take 1 tablet by mouth daily.        Marland Kitchen omeprazole (PRILOSEC) 20 MG capsule Take 20 mg by mouth daily.        . pravastatin (PRAVACHOL) 40 MG tablet TAKE ONE TABLET BY MOUTH AT BEDTIME  30 tablet  2    Allergies  Allergen Reactions  . Penicillins     Family History  Problem Relation Age of Onset  . Stroke Mother   . Coronary artery disease Father     BP 132/72  Pulse 92  Temp(Src) 98.1 F (36.7 C) (Oral)  Ht 5\' 3"  (1.6 m)  Wt 214 lb (97.07 kg)  BMI 37.91 kg/m2  SpO2 95%   Review of Systems She has lost a few lbs, due to her efforts.   She also has diffuse arthralgias.  meloxicam helps.     Objective:   Physical Exam Pulses: dorsalis pedis intact bilat.   Feet: no deformity.  no ulcer on the feet.  feet are of normal color and temp.  1+ left leg edema, and trace on the right Neuro: sensation is intact to touch on the feet.  Lab Results  Component Value Date   HGBA1C 7.4* 06/27/2011      Assessment & Plan:  Foot pain, new, uncertain etiology Dm, Needs increased rx, if it can be done with a regimen that avoids or minimizes hypoglycemia.

## 2011-08-11 ENCOUNTER — Other Ambulatory Visit: Payer: Self-pay | Admitting: Endocrinology

## 2011-08-16 ENCOUNTER — Ambulatory Visit (INDEPENDENT_AMBULATORY_CARE_PROVIDER_SITE_OTHER): Payer: Medicare Other | Admitting: Endocrinology

## 2011-08-16 ENCOUNTER — Encounter: Payer: Self-pay | Admitting: Endocrinology

## 2011-08-16 VITALS — BP 130/74 | HR 75 | Temp 98.0°F | Ht 63.0 in | Wt 213.0 lb

## 2011-08-16 DIAGNOSIS — R21 Rash and other nonspecific skin eruption: Secondary | ICD-10-CM

## 2011-08-16 DIAGNOSIS — Z0000 Encounter for general adult medical examination without abnormal findings: Secondary | ICD-10-CM

## 2011-08-16 DIAGNOSIS — E119 Type 2 diabetes mellitus without complications: Secondary | ICD-10-CM

## 2011-08-16 MED ORDER — LISINOPRIL 40 MG PO TABS: 40.0000 mg | ORAL_TABLET | Freq: Every day | ORAL | Status: DC

## 2011-08-16 NOTE — Patient Instructions (Addendum)
please consider these measures for your health:  minimize alcohol.  do not use tobacco products.  have a colonoscopy at least every 10 years from age 75.  Women should have an annual mammogram from age 68.  keep firearms safely stored.  always use seat belts.  have working smoke alarms in your home.  see an eye doctor and dentist regularly.  never drive under the influence of alcohol or drugs (including prescription drugs).  those with fair skin should take precautions against the sun. please let me know what your wishes would be, if artificial life support measures should become necessary.  it is critically important to prevent falling down (keep floor areas well-lit, dry, and free of loose objects.  If you have a cane, walker, or wheelchair, you should use it, even for short trips around the house.  Also, try not to rush) You should have a vaccine against shingles (a painful rash which results from the  chickenpox infection which most people had many years ago).  This vaccine reduces, but does not totally eliminate the risk of shingles.  Because this is a medicare part d benefit, there are 3 ways you can get it:  You can go to a pharmacy and get the injection (I can give you a prescription), or I can give you a prescription to have filled at a pharmacy, and bring back here for [place] to give.  The other option is that you can pay up-front for it, and we'll give you a form to make a claim for reimbursement from your medicare part d carrier. Change lisinopril-hctz to lisinopril 40 mg daily. Stop potassium pill.  Please come back for a follow-up appointment in January.   Add januvia 100 mg very morning.  Here are some samples, which also contain metformin.  ue your metformin to get up to the total daily amont of metfromin and januvia as listed below.  Call when you get low, so i can send a prescription to your pharmacy. Refer to a dermatology specialist.  you will receive a phone call, about a day and time for an  appointment

## 2011-08-16 NOTE — Progress Notes (Signed)
Subjective:    Patient ID: Destiny Flowers, female    DOB: [birth year], 77 y.o.   MRN: [id number]  HPI Subjective:   Patient here for Medicare annual wellness visit and management of other chronic and acute problems.     Risk factors: advanced age    Roster of Physicians Providing Medical Care to Patient: pulm: young Podiatry: petrinitz Rheumtol: anderson Opthal: pt does not know   Activities of Daily Living: In your present state of health, do you have any difficulty performing the following activities?:  Preparing food and eating?: No  Bathing yourself: No  Getting dressed: No  Using the toilet: No  Moving around from place to place: No  In the past year have you fallen or had a near fall?: No    Home Safety: Has smoke detector and wears seat belts. Firearms are locked. No excess sun exposure.  Diet and Exercise  Current exercise habits: pt says limited by med probs Dietary issues discussed: pt says she overeats.   Depression Screen  Q1: Over the past two weeks, have you felt down, depressed or hopeless? no  Q2: Over the past two weeks, have you felt little interest or pleasure in doing things? no   The following portions of the patient's history were reviewed and updated as appropriate: allergies, current medications, past family history, past medical history, past social history, past surgical history and problem list.  Past Medical History  Diagnosis Date  . DIABETES MELLITUS, TYPE II 06/06/2007  . GERD 06/06/2007  . HYPERCHOLESTEROLEMIA 02/19/2008  . HYPERTENSION 06/06/2007  . HYPOTHYROIDISM 06/06/2007  . OSTEOPOROSIS 06/06/2007  . RENAL INSUFFICIENCY 06/06/2007  . Dysthymic disorder   . Homocysteinemia   . ALLERGIC RHINITIS   . OSTEOARTHRITIS   . INSOMNIA   . DVT (deep venous thrombosis)     Left leg  . OBSTRUCTIVE SLEEP APNEA     No past surgical history on file.  History   Social History  . Marital Status: Single    Spouse Name: N/A    Number of Children:  N/A  . Years of Education: N/A   Occupational History  . Not on file.   Social History Main Topics  . Smoking status: Passive Smoker  . Smokeless tobacco: Not on file  . Alcohol Use: No  . Drug Use: No  . Sexually Active:    Other Topics Concern  . Not on file   Social History Narrative  . No narrative on file    Current Outpatient Prescriptions on File Prior to Visit  Medication Sig Dispense Refill  . aspirin 81 MG tablet Take 81 mg by mouth daily.        . bromocriptine (PARLODEL) 2.5 MG tablet Take 1 tablet (2.5 mg total) by mouth at bedtime.  90 tablet  3  . Calcium Carbonate-Vitamin D (CALTRATE 600+D) 600-400 MG-UNIT per tablet Take 1 tablet by mouth 2 (two) times daily.        Blossom Hoops Iron (PERFECT IRON) 25 MG TABS Take by mouth as needed.        . Cholecalciferol (VITAMIN D3) 1000 UNITS CAPS Take by mouth daily.        . folic acid (FOLVITE) 400 MCG tablet Take 800 mcg by mouth daily.        Marland Kitchen levothyroxine (SYNTHROID, LEVOTHROID) 100 MCG tablet TAKE ONE TABLET BY MOUTH EVERY DAY  90 tablet  0  . magnesium oxide (MAG-OX 400) 400 MG tablet Take 1 tablet (400 mg  total) by mouth daily.  200 tablet  1  . metFORMIN (GLUCOPHAGE-XR) 500 MG 24 hr tablet TAKE FOUR TABLETS BY MOUTH IN THE MORNING  360 tablet  0  . Multiple Vitamin (MULTIVITAMIN) tablet Take 1 tablet by mouth daily.        Marland Kitchen omeprazole (PRILOSEC) 20 MG capsule Take 20 mg by mouth daily.        . pravastatin (PRAVACHOL) 40 MG tablet TAKE ONE TABLET BY MOUTH AT BEDTIME  30 tablet  2  . traZODone (DESYREL) 100 MG tablet Take 1 tablet (100 mg total) by mouth at bedtime.  90 tablet  3    Allergies  Allergen Reactions  . Penicillins     Family History  Problem Relation Age of Onset  . Stroke Mother   . Coronary artery disease Father     BP 130/74  Pulse 75  Temp(Src) 98 F (36.7 C) (Oral)  Ht 5\' 3"  (1.6 m)  Wt 213 lb (96.616 kg)  BMI 37.73 kg/m2  SpO2 96%   Review of Systems  Denies hearing loss,  and visual loss Objective:   Vision:  Sees opthalmologist Hearing: grossly normal Body mass index:  See vs page Msk: pt easily and quickly performs "get-up-and-go" from a sitting position Cognitive Impairment Assessment: cognition, memory and judgment appear normal.  remembers 3/3 at 5 minutes.  excellent recall.  can easily read and write a sentence.  alert and oriented x 3    Assessment:   Medicare wellness utd on preventive parameters    Plan:   During the course of the visit the patient was educated and counseled about appropriate screening and preventive services including:        Fall prevention   Screening mammography  Bone densitometry screening  Diabetes screening  Nutrition counseling   Vaccines / LABS Zostavax / Pnemonccoal Vaccine  today  PSA  Patient Instructions (the written plan) was given to the patient.        Review of Systems     Objective:   Physical Exam        Assessment & Plan:     SEPARATE EVALUATION FOLLOWS--EACH PROBLEM HERE IS NEW, NOT RESPONDING TO TREATMENT, OR POSES SIGNIFICANT RISK TO THE PATIENT'S HEALTH: HISTORY OF THE PRESENT ILLNESS: Pt states few of moderate pain at the left foot.  No assoc fever.  She saw dr petrinitz, who aspirated the joint.  She took colchicine, but it feels better now She also has a rash on her right wrist. PAST MEDICAL HISTORY reviewed and up to date today REVIEW OF SYSTEMS: Denies weight change, chest pain, and sob PHYSICAL EXAMINATION: VITAL SIGNS:  See vs page GENERAL: no distress Left foot: normal to my exam. Skin: right wrist: there is a 3x3 cm red rash. LAB/XRAY RESULTS: K+ was 4.4 recently IMPRESSION: Rash, possibly due to psoriasis, new Gout, exac by hctz Htn, well-controlled.  She can tolerate reduction of meds Hypokalemia.  With changing bp med, she no longer needs klor PLAN: See instruction page

## 2011-09-27 ENCOUNTER — Other Ambulatory Visit: Payer: Self-pay | Admitting: *Deleted

## 2011-09-27 MED ORDER — METFORMIN HCL ER 500 MG PO TB24: ORAL_TABLET | ORAL | Status: DC

## 2011-09-27 NOTE — Telephone Encounter (Signed)
[facility] called on behalf of pt for refill of Metformin

## 2011-10-05 ENCOUNTER — Other Ambulatory Visit: Payer: Self-pay | Admitting: Endocrinology

## 2011-10-05 NOTE — Telephone Encounter (Signed)
Done

## 2011-10-31 ENCOUNTER — Other Ambulatory Visit: Payer: Self-pay | Admitting: *Deleted

## 2011-10-31 MED ORDER — SITAGLIPTIN PHOSPHATE 100 MG PO TABS: 100.0000 mg | ORAL_TABLET | Freq: Every day | ORAL | Status: DC

## 2011-10-31 NOTE — Telephone Encounter (Signed)
Pt is requesting a refill of Januvia be sent to [place]. Pt informed rx sent to pharmacy via VM and to callback office with any questions/concerns.

## 2011-11-11 ENCOUNTER — Telehealth: Payer: Self-pay | Admitting: Internal Medicine

## 2011-11-11 ENCOUNTER — Ambulatory Visit (INDEPENDENT_AMBULATORY_CARE_PROVIDER_SITE_OTHER)
Admission: RE | Admit: 2011-11-11 | Discharge: 2011-11-11 | Disposition: A | Discharge status: Home / Self Care | Payer: Medicare Other | Source: Ambulatory Visit | Attending: Internal Medicine | Admitting: Internal Medicine

## 2011-11-11 ENCOUNTER — Encounter: Payer: Self-pay | Admitting: Internal Medicine

## 2011-11-11 ENCOUNTER — Encounter: Payer: Self-pay | Admitting: Endocrinology

## 2011-11-11 ENCOUNTER — Ambulatory Visit (INDEPENDENT_AMBULATORY_CARE_PROVIDER_SITE_OTHER): Payer: Medicare Other | Admitting: Internal Medicine

## 2011-11-11 VITALS — BP 112/76 | HR 101 | Ht 64.0 in | Wt 186.4 lb

## 2011-11-11 DIAGNOSIS — Z87891 Personal history of nicotine dependence: Secondary | ICD-10-CM

## 2011-11-11 DIAGNOSIS — J111 Influenza due to unidentified influenza virus with other respiratory manifestations: Secondary | ICD-10-CM

## 2011-11-11 DIAGNOSIS — J309 Allergic rhinitis, unspecified: Secondary | ICD-10-CM

## 2011-11-11 MED ORDER — PHENYLEPHRINE HCL 1 % NA SOLN
3.0000 [drp] | Freq: Once | NASAL | Status: AC
  Administered 2011-11-11: 3 [drp] via NASAL

## 2011-11-11 MED ORDER — AZITHROMYCIN 250 MG PO TABS: ORAL_TABLET | ORAL | Status: AC

## 2011-11-11 NOTE — Progress Notes (Signed)
Patient ID: Destiny Flowers, female    DOB: 1929/06/09, 77 y.o.   MRN: [id number]  HPI 07/14/11- 77 year old female with history of passive smoking, followed for obstructive sleep apnea, allergic rhinitis, complicated by hypothyroid, DM, HBP, renal insufficiency Last here January 07, 2011. CPAP 11- wakes and takes it off in sleep, feeling somebody has "got" her. Discussed going to the closer [facility] branch in [place] so that she can discuss mask fitting and machine issues directly with her home care company.. She feels confined by the mask,not smothered by the pressure..  Left leg- the side that had remote DVT- has noted a bit of painless swelling on dorsum of left foot.  Coughs a little clear mucus in the morning. Can't walk easily and asks for handicaped parking.   11/11/11- 77 year old female with history of passive smoking, followed for obstructive sleep apnea, allergic rhinitis, complicated by hypothyroid, DM, HBP, renal insufficiency. Friend here. Acute visit-haorseness, cough-yellow in color; increased SOB since Christmas eve; fever and chills. 4 days blowing and cough. Persistent nasal congestion. Denies sore throat, fever, GI upset. Nothing purulent or painful.  Review of Systems Constitutional:  +28 lb weight loss- not sure why,  No-night sweats, fevers, chills, fatigue, lassitude. HEENT:   No-  headaches, difficulty swallowing, tooth/dental problems, sore throat,       +  sneezing, itching, ear ache, nasal congestion, post nasal drip,  CV:  No-   chest pain, orthopnea, PND, swelling in lower extremities, anasarca, dizziness, palpitations Resp: Some acute  shortness of breath with exertion or at rest.              No-   productive cough,  +non-productive cough,  No-  coughing up of blood.              No-   change in color of mucus.  No- wheezing.   Skin: No-   rash or lesions. GI:  No-   heartburn, indigestion, abdominal pain, nausea, vomiting, diarrhea,                 change  in bowel habits, loss of appetite GU:  MS:  No-   joint pain, + swelling.  No- decreased range of motion.  No- back pain. Neuro- grossly normal to observation, Or:  Psych:  No- change in mood or affect. No depression or anxiety.  No memory loss.      Objective:   Physical Exam  General- Alert, Oriented, Affect-appropriate, Distress- none acute   Overweight,  Talkative, elderly Skin- rash-none, lesions- none, excoriation- none Lymphadenopathy- none Head- atraumatic            Eyes- Gross vision intact, PERRLA, conjunctivae clear secretions            Ears- Hearing, canals normal            Nose- Clear, No- Septal dev, mucus, polyps, erosion, perforation             Throat- Mallampati II , mucosa clear , drainage- none, tonsils- atrophic Neck- flexible , trachea midline, no stridor , thyroid nl, carotid no bruit Chest - symmetrical excursion , unlabored           Heart/CV- RRR , no murmur , no gallop  , no rub, nl s1 s2                           - JVD- none , +edema- trace, dorsum left  foot, stasis changes- none, varices- none           Lung- diffuse crackles, wheeze- none, cough- none , dullness-none, rub- none           Chest wall-  Abd- tender-no, distended-no, bowel sounds-present, HSM- no Br/ Gen/ Rectal- Not done, not indicated Extrem- cyanosis- none, clubbing, none, atrophy- none, strength- nl     Homan's negative.  Neuro- grossly intact to observation

## 2011-11-11 NOTE — Telephone Encounter (Signed)
I spoke with pt and she c/o cough w/ yellow phlem, nasal congestion, wheezing, chest tightness, PND, increase SOB w/ exertion x 3 days. Pt states she is very confused and can't think straight. Pt is requesting for Dr. Maple Hudson to see her today. Please advise, Dr. Maple Hudson, thanks  Allergies  Allergen Reactions  . Penicillins

## 2011-11-11 NOTE — Telephone Encounter (Signed)
Patient is being seen today at 130 pm by CY.

## 2011-11-11 NOTE — Patient Instructions (Addendum)
Script sent for Z pak  Neb neo nasal  Order- CXR  Please see Dr Everardo All soon about the weight loss and diabetes management

## 2011-11-13 DIAGNOSIS — J440 Chronic obstructive pulmonary disease with acute lower respiratory infection: Secondary | ICD-10-CM | POA: Insufficient documentation

## 2011-11-13 NOTE — Assessment & Plan Note (Signed)
Viral pattern URI with bronchitis. Her weight loss is concerning but she and her friend attributed to her diabetes and they will be following up with Dr. Everardo All. Plan Z-Pak, chest x-ray, supportive care including fluids. I expect this to gradually resolve. If it does not, we will see her back early

## 2011-11-14 ENCOUNTER — Other Ambulatory Visit: Payer: Self-pay | Admitting: Endocrinology

## 2011-11-14 NOTE — Progress Notes (Signed)
Quick Note:  lmtr[facility] 11/14/2011 Kim T ______

## 2011-11-17 ENCOUNTER — Ambulatory Visit: Payer: Medicare Other | Admitting: Endocrinology

## 2011-11-22 ENCOUNTER — Ambulatory Visit: Payer: Medicare Other | Admitting: Endocrinology

## 2011-11-24 ENCOUNTER — Ambulatory Visit (INDEPENDENT_AMBULATORY_CARE_PROVIDER_SITE_OTHER): Payer: Medicare Other | Admitting: Endocrinology

## 2011-11-24 ENCOUNTER — Encounter: Payer: Self-pay | Admitting: Endocrinology

## 2011-11-24 ENCOUNTER — Other Ambulatory Visit (INDEPENDENT_AMBULATORY_CARE_PROVIDER_SITE_OTHER): Payer: Medicare Other

## 2011-11-24 ENCOUNTER — Telehealth: Payer: Self-pay | Admitting: Internal Medicine

## 2011-11-24 DIAGNOSIS — R634 Abnormal weight loss: Secondary | ICD-10-CM

## 2011-11-24 DIAGNOSIS — E119 Type 2 diabetes mellitus without complications: Secondary | ICD-10-CM

## 2011-11-24 LAB — CBC WITH DIFFERENTIAL/PLATELET
Basophils Relative: 0.4 % (ref 0.0–3.0)
Eosinophils Relative: 1.3 % (ref 0.0–5.0)
Hemoglobin: 11.9 g/dL — ABNORMAL LOW (ref 12.0–15.0)
Lymphocytes Relative: 17.3 % (ref 12.0–46.0)
Monocytes Relative: 6.3 % (ref 3.0–12.0)
Neutro Abs: 4.6 10*3/uL (ref 1.4–7.7)
Neutrophils Relative %: 74.7 % (ref 43.0–77.0)
RBC: 4.13 Mil/uL (ref 3.87–5.11)
WBC: 6.2 10*3/uL (ref 4.5–10.5)

## 2011-11-24 LAB — HEMOGLOBIN A1C: Hgb A1c MFr Bld: 6.5 % (ref 4.6–6.5)

## 2011-11-24 NOTE — Progress Notes (Signed)
Quick Note:  Pt aware of results. ______ 

## 2011-11-24 NOTE — Patient Instructions (Addendum)
blood tests are being requested for you today.  please call [phone] to hear your test results.  You will be prompted to enter the 9-digit "MRN" number that appears at the top left of this page, followed by #.  Then you will hear the message. Call [phone], to get an appointment for a mammogram. stay-off the bromocriptine for now.   Please come back for a follow-up appointment in 3 months.   If these blood tests don't show a cause for your weight loss, i will request a ct scan for you.   (update: i left message on phone-tree:  Reduce zestril to 20/d.  i ordered ct).

## 2011-11-24 NOTE — Telephone Encounter (Signed)
Destiny Flowers will speak w/ pt regarding CXR Results.  Per Karilyn Cota no note needed.  Antionette Fairy

## 2011-11-24 NOTE — Progress Notes (Signed)
Subjective:    Patient ID: Destiny Flowers, female    DOB: [birth year], 78 y.o.   MRN: [id number]  HPI Pt returns for f/u of type 2 DM (1997).   Pt states few mos of moderate weight loss, but no pain at the abdomen.  she has assoc poor appetite. She takes zestril as rx'ed.   She says dr young for acute bronchitis a few weeks ago, and is only slightly better.   Past Medical History  Diagnosis Date  . DIABETES MELLITUS, TYPE II 06/06/2007  . GERD 06/06/2007  . HYPERCHOLESTEROLEMIA 02/19/2008  . HYPERTENSION 06/06/2007  . HYPOTHYROIDISM 06/06/2007  . OSTEOPOROSIS 06/06/2007  . RENAL INSUFFICIENCY 06/06/2007  . Dysthymic disorder   . Homocysteinemia   . ALLERGIC RHINITIS   . OSTEOARTHRITIS   . INSOMNIA   . DVT (deep venous thrombosis)     Left leg  . OBSTRUCTIVE SLEEP APNEA     No past surgical history on file.  History   Social History  . Marital Status: Single    Spouse Name: N/A    Number of Children: N/A  . Years of Education: N/A   Occupational History  . Not on file.   Social History Main Topics  . Smoking status: Passive Smoker  . Smokeless tobacco: Not on file  . Alcohol Use: No  . Drug Use: No  . Sexually Active:    Other Topics Concern  . Not on file   Social History Narrative  . No narrative on file    Current Outpatient Prescriptions on File Prior to Visit  Medication Sig Dispense Refill  . allopurinol (ZYLOPRIM) 100 MG tablet Take 100 mg by mouth daily.        Marland Kitchen aspirin 81 MG tablet Take 81 mg by mouth daily.        . Calcium Carbonate-Vitamin D (CALTRATE 600+D) 600-400 MG-UNIT per tablet Take 1 tablet by mouth 2 (two) times daily.        Blossom Hoops Iron (PERFECT IRON) 25 MG TABS Take by mouth as needed.        . Cholecalciferol (VITAMIN D3) 1000 UNITS CAPS Take by mouth daily.        . colchicine 0.6 MG tablet Take 0.6 mg by mouth as needed. For gout       . folic acid (FOLVITE) 400 MCG tablet Take 800 mcg by mouth daily.        Marland Kitchen levothyroxine  (SYNTHROID, LEVOTHROID) 100 MCG tablet TAKE ONE TABLET BY MOUTH EVERY DAY. NO FURTHER REFILLS WITHOUT OFFICE VISIT  90 tablet  2  . magnesium oxide (MAG-OX 400) 400 MG tablet Take 1 tablet (400 mg total) by mouth daily.  200 tablet  1  . metFORMIN (GLUCOPHAGE-XR) 500 MG 24 hr tablet TAKE FOUR TABLETS BY MOUTH IN THE MORNING  360 tablet  2  . Multiple Vitamin (MULTIVITAMIN) tablet Take 1 tablet by mouth daily.        Marland Kitchen omeprazole (PRILOSEC) 20 MG capsule Take 20 mg by mouth daily.        . pravastatin (PRAVACHOL) 40 MG tablet TAKE ONE TABLET BY MOUTH EVERY DAY AT BEDTIME  30 tablet  0  . sitaGLIPtin (JANUVIA) 100 MG tablet Take 1 tablet (100 mg total) by mouth daily.  90 tablet  2  . traMADol-acetaminophen (ULTRACET) 37.5-325 MG per tablet Take 1 tablet by mouth as needed. For pain        . traZODone (DESYREL) 100 MG tablet Take 100  mg by mouth at bedtime.          Allergies  Allergen Reactions  . Penicillins     Family History  Problem Relation Age of Onset  . Stroke Mother   . Coronary artery disease Father     BP 124/78  Pulse 71  Temp(Src) 97.8 F (36.6 C) (Oral)  Ht 5\' 4"  (1.626 m)  Wt 192 lb 12.8 oz (87.454 kg)  BMI 33.09 kg/m2  SpO2 97%     Review of Systems Denies fever/n/v/brbpr/hematuria/dysphagia.      Objective:   Physical Exam VITAL SIGNS:  See vs page GENERAL: no distress LUNGS:  Clear to auscultation ABDOMEN:  abdomen is soft, nontender.  no hepatosplenomegaly. not distended.  no hernia,.    Lab Results  Component Value Date   WBC 6.2 11/24/2011   HGB 11.9* 11/24/2011   HCT 35.3* 11/24/2011   PLT 210.0 11/24/2011   GLUCOSE 97 11/24/2011   CHOL 134 04/30/2010   TRIG 127.0 04/30/2010   HDL 46.60 04/30/2010   LDLCALC 62 04/30/2010   ALT 14 06/27/2011   AST 18 06/27/2011   NA 140 11/24/2011   K 5.2* 11/24/2011   CL 104 11/24/2011   CREATININE 1.2 11/24/2011   BUN 18 11/24/2011   CO2 27 11/24/2011   TSH 0.34* 11/24/2011   HGBA1C 6.5 11/24/2011   MICROALBUR  1.5 04/30/2010      Assessment & Plan:  Weight loss, uncertain etiology Hypothyroidism, minimally overreplaced Mild anemia, uncertain etiology Htn, slightly overcontrolled Hyperkalemia, mild, due to zestril. DM, well-controlled.  She doesn't need the parlodel.

## 2011-11-25 LAB — BASIC METABOLIC PANEL
BUN: 18 mg/dL (ref 6–23)
CO2: 27 mEq/L (ref 19–32)
Chloride: 104 mEq/L (ref 96–112)
Creatinine, Ser: 1.2 mg/dL (ref 0.4–1.2)
Potassium: 5.2 mEq/L — ABNORMAL HIGH (ref 3.5–5.1)

## 2011-11-25 LAB — TSH: TSH: 0.34 u[IU]/mL — ABNORMAL LOW (ref 0.35–5.50)

## 2011-11-30 ENCOUNTER — Other Ambulatory Visit: Payer: Medicare Other

## 2011-12-02 ENCOUNTER — Inpatient Hospital Stay: Admission: RE | Admit: 2011-12-02 | Payer: Medicare Other | Source: Ambulatory Visit

## 2011-12-05 ENCOUNTER — Ambulatory Visit (INDEPENDENT_AMBULATORY_CARE_PROVIDER_SITE_OTHER)
Admission: RE | Admit: 2011-12-05 | Discharge: 2011-12-05 | Disposition: A | Discharge status: Home / Self Care | Payer: Medicare Other | Source: Ambulatory Visit | Attending: Endocrinology | Admitting: Endocrinology

## 2011-12-05 DIAGNOSIS — R634 Abnormal weight loss: Secondary | ICD-10-CM

## 2011-12-05 MED ORDER — IOHEXOL 300 MG/ML  SOLN
100.0000 mL | Freq: Once | INTRAMUSCULAR | Status: AC | PRN
  Administered 2011-12-05: 100 mL via INTRAVENOUS

## 2011-12-28 ENCOUNTER — Other Ambulatory Visit: Payer: Self-pay | Admitting: Endocrinology

## 2012-01-12 ENCOUNTER — Ambulatory Visit: Payer: Medicare Other | Admitting: Internal Medicine

## 2012-03-05 ENCOUNTER — Telehealth: Payer: Self-pay | Admitting: Endocrinology

## 2012-03-05 NOTE — Telephone Encounter (Signed)
Handicap application placed on MD's desk to complete

## 2012-03-05 NOTE — Telephone Encounter (Signed)
Needs a new handicap sticker, needs by this weekend, please call [phone] when ready

## 2012-03-06 NOTE — Telephone Encounter (Signed)
Pt informed, handicap application upfront in cabinet ready for pickup.

## 2012-03-06 NOTE — Telephone Encounter (Signed)
done

## 2012-07-02 ENCOUNTER — Other Ambulatory Visit (INDEPENDENT_AMBULATORY_CARE_PROVIDER_SITE_OTHER): Payer: Medicare Other

## 2012-07-02 ENCOUNTER — Encounter: Payer: Self-pay | Admitting: Endocrinology

## 2012-07-02 ENCOUNTER — Ambulatory Visit (INDEPENDENT_AMBULATORY_CARE_PROVIDER_SITE_OTHER): Payer: Medicare Other | Admitting: Endocrinology

## 2012-07-02 VITALS — BP 130/80 | HR 75 | Temp 97.1°F | Resp 16 | Wt 188.0 lb

## 2012-07-02 DIAGNOSIS — E119 Type 2 diabetes mellitus without complications: Secondary | ICD-10-CM

## 2012-07-02 DIAGNOSIS — E7210 Disorders of sulfur-bearing amino-acid metabolism, unspecified: Secondary | ICD-10-CM

## 2012-07-02 DIAGNOSIS — M109 Gout, unspecified: Secondary | ICD-10-CM

## 2012-07-02 DIAGNOSIS — N259 Disorder resulting from impaired renal tubular function, unspecified: Secondary | ICD-10-CM

## 2012-07-02 DIAGNOSIS — I10 Essential (primary) hypertension: Secondary | ICD-10-CM

## 2012-07-02 DIAGNOSIS — E559 Vitamin D deficiency, unspecified: Secondary | ICD-10-CM

## 2012-07-02 DIAGNOSIS — E039 Hypothyroidism, unspecified: Secondary | ICD-10-CM

## 2012-07-02 DIAGNOSIS — D649 Anemia, unspecified: Secondary | ICD-10-CM

## 2012-07-02 LAB — LIPID PANEL
Total CHOL/HDL Ratio: 3
Triglycerides: 93 mg/dL (ref 0.0–149.0)

## 2012-07-02 LAB — MICROALBUMIN / CREATININE URINE RATIO
Creatinine,U: 76.6 mg/dL
Microalb Creat Ratio: 1.2 mg/g (ref 0.0–30.0)
Microalb, Ur: 0.9 mg/dL (ref 0.0–1.9)

## 2012-07-02 LAB — CBC WITH DIFFERENTIAL/PLATELET
Basophils Absolute: 0 10*3/uL (ref 0.0–0.1)
Basophils Relative: 0.4 % (ref 0.0–3.0)
Eosinophils Relative: 1.9 % (ref 0.0–5.0)
HCT: 34.8 % — ABNORMAL LOW (ref 36.0–46.0)
Hemoglobin: 11.5 g/dL — ABNORMAL LOW (ref 12.0–15.0)
Lymphocytes Relative: 22.9 % (ref 12.0–46.0)
Lymphs Abs: 1.5 10*3/uL (ref 0.7–4.0)
Monocytes Relative: 9.9 % (ref 3.0–12.0)
Neutro Abs: 4.1 10*3/uL (ref 1.4–7.7)
RBC: 3.87 Mil/uL (ref 3.87–5.11)

## 2012-07-02 LAB — BASIC METABOLIC PANEL
CO2: 28 mEq/L (ref 19–32)
Chloride: 102 mEq/L (ref 96–112)
Creatinine, Ser: 0.9 mg/dL (ref 0.4–1.2)
Glucose, Bld: 93 mg/dL (ref 70–99)

## 2012-07-02 LAB — URINALYSIS, ROUTINE W REFLEX MICROSCOPIC
Ketones, ur: NEGATIVE
Leukocytes, UA: NEGATIVE
Specific Gravity, Urine: 1.025 (ref 1.000–1.030)
Total Protein, Urine: NEGATIVE
Urine Glucose: NEGATIVE
pH: 5.5 (ref 5.0–8.0)

## 2012-07-02 LAB — IBC PANEL
Saturation Ratios: 26.5 % (ref 20.0–50.0)
Transferrin: 331.2 mg/dL (ref 212.0–360.0)

## 2012-07-02 LAB — HEMOGLOBIN A1C: Hgb A1c MFr Bld: 6.4 % (ref 4.6–6.5)

## 2012-07-02 LAB — TSH: TSH: 0.56 u[IU]/mL (ref 0.35–5.50)

## 2012-07-02 MED ORDER — CLOTRIMAZOLE-BETAMETHASONE 1-0.05 % EX CREA: TOPICAL_CREAM | CUTANEOUS | Status: DC

## 2012-07-02 NOTE — Patient Instructions (Addendum)
Please come back for a "medicare wellness" appointment in 3 months blood tests are being requested for you today.  You will receive a letter with results. i have sent a prescription to your pharmacy, for a skin cream. Try stopping the prilosec on a trial basis.  If your gerd symptoms come back, you will need to resume.

## 2012-07-02 NOTE — Progress Notes (Signed)
Subjective:    Patient ID: Destiny Flowers, female    DOB: 06-14-1929, 78 y.o.   MRN: [id number]  HPI Pt states many years of intermittent moderate rash under the breasts, and assoc itching. Hyperkalemia: pt says she has slight generalized weakness recently. Gout: she has intermittent pain at both great toe mtp's. Anemia was noted in early 2013.  Denies brbpr Pt wants trial off the prilosec.  No sxs.   Past Medical History  Diagnosis Date  . DIABETES MELLITUS, TYPE II 06/06/2007  . GERD 06/06/2007  . HYPERCHOLESTEROLEMIA 02/19/2008  . HYPERTENSION 06/06/2007  . HYPOTHYROIDISM 06/06/2007  . OSTEOPOROSIS 06/06/2007  . RENAL INSUFFICIENCY 06/06/2007  . Dysthymic disorder   . Homocysteinemia   . ALLERGIC RHINITIS   . OSTEOARTHRITIS   . INSOMNIA   . DVT (deep venous thrombosis)     Left leg  . OBSTRUCTIVE SLEEP APNEA     No past surgical history on file.  History   Social History  . Marital Status: Single    Spouse Name: N/A    Number of Children: N/A  . Years of Education: N/A   Occupational History  . Not on file.   Social History Main Topics  . Smoking status: Passive Smoker  . Smokeless tobacco: Not on file  . Alcohol Use: No  . Drug Use: No  . Sexually Active:    Other Topics Concern  . Not on file   Social History Narrative  . No narrative on file    Current Outpatient Prescriptions on File Prior to Visit  Medication Sig Dispense Refill  . allopurinol (ZYLOPRIM) 100 MG tablet Take 100 mg by mouth daily.        Marland Kitchen aspirin 81 MG tablet Take 81 mg by mouth daily.        . Calcium Carbonate-Vitamin D (CALTRATE 600+D) 600-400 MG-UNIT per tablet Take 1 tablet by mouth 2 (two) times daily.        Blossom Hoops Iron (PERFECT IRON) 25 MG TABS Take by mouth as needed.        . Cholecalciferol (VITAMIN D3) 1000 UNITS CAPS Take by mouth daily.        . folic acid (FOLVITE) 400 MCG tablet Take 800 mcg by mouth daily.        Marland Kitchen levothyroxine (SYNTHROID, LEVOTHROID) 100 MCG tablet        . lisinopril (PRINIVIL,ZESTRIL) 40 MG tablet Take 20 mg by mouth daily.      . metFORMIN (GLUCOPHAGE-XR) 500 MG 24 hr tablet TAKE FOUR TABLETS BY MOUTH IN THE MORNING  360 tablet  2  . Multiple Vitamin (MULTIVITAMIN) tablet Take 1 tablet by mouth daily.        . pravastatin (PRAVACHOL) 40 MG tablet TAKE ONE TABLET BY MOUTH AT BEDTIME  30 tablet  5  . sitaGLIPtin (JANUVIA) 100 MG tablet Take 1 tablet (100 mg total) by mouth daily.  90 tablet  2  . traMADol-acetaminophen (ULTRACET) 37.5-325 MG per tablet Take 1 tablet by mouth as needed. For pain        . traZODone (DESYREL) 100 MG tablet Take 100 mg by mouth at bedtime.        . colchicine 0.6 MG tablet Take 0.6 mg by mouth as needed. For gout       . traZODone (DESYREL) 100 MG tablet Take 1 tablet (100 mg total) by mouth at bedtime.  90 tablet  3    Allergies  Allergen Reactions  . Penicillins  Family History  Problem Relation Age of Onset  . Stroke Mother   . Coronary artery disease Father    BP 130/80  Pulse 75  Temp 97.1 F (36.2 C) (Oral)  Resp 16  Wt 188 lb (85.276 kg)  SpO2 97% Review of Systems Denies hematuria.  She has foul-smelling urine, but no dysuria or fever.    Objective:   Physical Exam VITAL SIGNS:  See vs page GENERAL: no distress Skin: slight red rash under skin folds.     Lab Results  Component Value Date   WBC 6.4 07/02/2012   HGB 11.5* 07/02/2012   HCT 34.8* 07/02/2012   PLT 244.0 07/02/2012   GLUCOSE 93 07/02/2012   CHOL 132 07/02/2012   TRIG 93.0 07/02/2012   HDL 51.60 07/02/2012   LDLCALC 62 07/02/2012   ALT 14 06/27/2011   AST 18 06/27/2011   NA 138 07/02/2012   K 4.3 07/02/2012   CL 102 07/02/2012   CREATININE 0.9 07/02/2012   BUN 29* 07/02/2012   CO2 28 07/02/2012   TSH 0.56 07/02/2012   HGBA1C 6.4 07/02/2012   MICROALBUR 0.9 07/02/2012      Assessment & Plan:  Tinea corporis, new Gerd, better Hyperkalemia, better Anemia, stable.  We'll follow.

## 2012-07-03 ENCOUNTER — Telehealth: Payer: Self-pay | Admitting: *Deleted

## 2012-07-03 ENCOUNTER — Encounter: Payer: Self-pay | Admitting: Endocrinology

## 2012-07-03 NOTE — Telephone Encounter (Signed)
Called pt to inform of lab results, no answer/line busy (letter also mailed to pt).

## 2012-07-04 NOTE — Telephone Encounter (Signed)
Pt informed of lab results. 

## 2012-07-04 NOTE — Telephone Encounter (Signed)
Left message for pt to callback office.  

## 2012-08-07 ENCOUNTER — Other Ambulatory Visit: Payer: Self-pay | Admitting: Endocrinology

## 2012-09-12 ENCOUNTER — Other Ambulatory Visit: Payer: Self-pay | Admitting: Endocrinology

## 2012-09-17 ENCOUNTER — Encounter: Payer: Medicare Other | Admitting: Endocrinology

## 2012-09-17 DIAGNOSIS — Z0289 Encounter for other administrative examinations: Secondary | ICD-10-CM

## 2012-10-05 ENCOUNTER — Other Ambulatory Visit: Payer: Self-pay | Admitting: Endocrinology

## 2012-10-05 NOTE — Telephone Encounter (Signed)
Refill on trazodone sent to [facility].

## 2012-11-25 ENCOUNTER — Other Ambulatory Visit: Payer: Self-pay | Admitting: Endocrinology

## 2013-02-12 ENCOUNTER — Telehealth: Payer: Self-pay | Admitting: Internal Medicine

## 2013-02-12 NOTE — Telephone Encounter (Signed)
Called, spoke with pt.  C/o prod cough with yellow phelgm, increased SOB with activities, wheezing, PND, and body aches.  Symptoms started on Sunday.  Denies chest tightness, chest pain, f/c/s, sinus pressure, or HA.  Has been taking zyrtec with no relief.  Last OV with CDY 12/28/201; asked to f/u in 1 year.  We have scheduled pt to see Dr. Maple Hudson on tomorrow, February 13, 2013 at 10:30 am.  Pt aware and aware and ok with this.

## 2013-02-13 ENCOUNTER — Encounter: Payer: Self-pay | Admitting: Internal Medicine

## 2013-02-13 ENCOUNTER — Ambulatory Visit (INDEPENDENT_AMBULATORY_CARE_PROVIDER_SITE_OTHER)
Admission: RE | Admit: 2013-02-13 | Discharge: 2013-02-13 | Disposition: A | Discharge status: Home / Self Care | Payer: Medicare Other | Source: Ambulatory Visit | Attending: Internal Medicine | Admitting: Internal Medicine

## 2013-02-13 ENCOUNTER — Ambulatory Visit (INDEPENDENT_AMBULATORY_CARE_PROVIDER_SITE_OTHER): Payer: Medicare Other | Admitting: Internal Medicine

## 2013-02-13 VITALS — BP 122/60 | HR 104 | Ht 64.75 in | Wt 203.4 lb

## 2013-02-13 DIAGNOSIS — J209 Acute bronchitis, unspecified: Secondary | ICD-10-CM

## 2013-02-13 MED ORDER — METHYLPREDNISOLONE ACETATE 80 MG/ML IJ SUSP
80.0000 mg | Freq: Once | INTRAMUSCULAR | Status: AC
  Administered 2013-02-13: 80 mg via INTRAMUSCULAR

## 2013-02-13 MED ORDER — LEVALBUTEROL HCL 0.63 MG/3ML IN NEBU
0.6300 mg | INHALATION_SOLUTION | Freq: Once | RESPIRATORY_TRACT | Status: AC
  Administered 2013-02-13: 0.63 mg via RESPIRATORY_TRACT

## 2013-02-13 MED ORDER — OLMESARTAN MEDOXOMIL 40 MG PO TABS: 20.0000 mg | ORAL_TABLET | Freq: Every day | ORAL | Status: DC

## 2013-02-13 NOTE — Progress Notes (Signed)
Patient ID: Destiny Flowers, female    DOB: 1929/03/07, 79 y.o.   MRN: [id number]  HPI 07/14/11- 79 year old female with history of passive smoking, followed for obstructive sleep apnea, allergic rhinitis, complicated by hypothyroid, DM, HBP, renal insufficiency Last here January 07, 2011. CPAP 11- wakes and takes it off in sleep, feeling somebody has "got" her. Discussed going to the [facility] in [place] so that she can discuss mask fitting and machine issues directly with her home care company.. She feels confined by the mask,not smothered by the pressure..  Left leg- the side that had remote DVT- has noted a bit of painless swelling on dorsum of left foot.  Coughs a little clear mucus in the morning. Can't walk easily and asks for handicaped parking.   11/11/11- 79 year old female with history of passive smoking, followed for obstructive sleep apnea, allergic rhinitis, complicated by hypothyroid, DM, HBP, renal insufficiency. Friend here. Acute visit-haorseness, cough-yellow in color; increased SOB since Christmas eve; fever and chills. 4 days blowing and cough. Persistent nasal congestion. Denies sore throat, fever, GI upset. Nothing purulent or painful.  02/13/13- 79 year old female with history of passive smoking, followed for obstructive sleep apnea, allergic rhinitis, complicated by hypothyroid, DM, HBP, renal insufficiency.  FOLLOWS YQM:VHQI seen 2012; cough-productive and deep-yellow in color; SOB and wheezing as well. Acute visit- 4 days increased cough, nasal congestion, rhinorhea, yellow. No fever, sore throat.  Blames pollen. Also aware of water problem under house- being worked on.  Baseline bothersome dry cough- mild. We discussed lisinopril/ ACEI  taken for renal preservation. CXR 11/24/11 IMPRESSION:  1. Airway thickening may reflect bronchitis or reactive airways  disease. No airspace opacity is identified to suggest bacterial  pneumonia pattern.  2. Moderate  sized hiatal hernia.  Original Report Authenticated By: Dellia Cloud, M.D.  Review of Systems-see HPI Constitutional:  No-weight loss,  No-night sweats, fevers, chills, fatigue, lassitude. HEENT:   No-  headaches, difficulty swallowing, tooth/dental problems, sore throat,       +  sneezing, itching, ear ache, nasal congestion, post nasal drip,  CV:  No-   chest pain, orthopnea, PND, swelling in lower extremities, anasarca, dizziness, palpitations Resp: Some acute  shortness of breath with exertion or at rest.              + productive cough,  +non-productive cough,  No-  coughing up of blood.              + change in color of mucus.  No- wheezing.   Skin: No-   rash or lesions. GI:  No-   heartburn, indigestion, abdominal pain, nausea, vomiting, GU:  MS:  No-   joint pain, + swelling.  . Neuro- nothing unusual Psych:  No- change in mood or affect. No depression or anxiety.  No memory loss.  Objective:   Physical Exam  General- Alert, Oriented, Affect-appropriate, Distress- none acute   Overweight,  Talkative, elderly Skin- rash-none, lesions- none, excoriation- none Lymphadenopathy- none Head- atraumatic            Eyes- Gross vision intact, PERRLA, conjunctivae clear secretions            Ears- Hearing, canals normal            Nose- +sniffing, No- Septal dev, mucus, polyps, erosion, perforation             Throat- Mallampati II , mucosa clear , drainage- none, tonsils- atrophic Neck- flexible , trachea midline,  no stridor , thyroid nl, carotid no bruit Chest - symmetrical excursion , unlabored           Heart/CV- RRR , no murmur , no gallop  , no rub, nl s1 s2                           - JVD- none , +edema- trace, dorsum left foot, stasis changes- none, varices- none           Lung- diffuse crackles, wheeze- none, cough +raspy , dullness-none, rub- none           Chest wall-  Abd-  Br/ Gen/ Rectal- Not done, not indicated Extrem- cyanosis- none, clubbing, none, atrophy-  none, strength- nl     Neuro- grossly intact to observation

## 2013-02-13 NOTE — Patient Instructions (Addendum)
Neb xop 0.63     Depo 80   Order CXR   Dx acute bronchitis  Samples Benicar 20 mg to take 1 daily x 2 weeks, instead of lisinopril   We want to see if the chronic dry cough gets better. Dr Everardo All can consider a permanent change from Lisinopril

## 2013-02-13 NOTE — Assessment & Plan Note (Signed)
Viral vs allergic bronchitis Suspect background chronic dry cough might be due to ACE inhibitor lisinopril given for renal preservation by Dr Everardo All. Plan- neb xop, depomedrol. CXR. Samples Benicar 20 mg x 2 weeks trial. Then she will return to lisinopril until she can discuss with Dr Everardo All.

## 2013-02-14 ENCOUNTER — Other Ambulatory Visit: Payer: Self-pay | Admitting: *Deleted

## 2013-02-14 ENCOUNTER — Other Ambulatory Visit: Payer: Self-pay | Admitting: Endocrinology

## 2013-02-14 MED ORDER — METFORMIN HCL ER 500 MG PO TB24: ORAL_TABLET | ORAL | Status: DC

## 2013-02-19 ENCOUNTER — Telehealth: Payer: Self-pay | Admitting: Internal Medicine

## 2013-02-19 MED ORDER — DOXYCYCLINE HYCLATE 100 MG PO TABS: ORAL_TABLET | ORAL | Status: DC

## 2013-02-19 MED ORDER — BENZONATATE 100 MG PO CAPS: 100.0000 mg | ORAL_CAPSULE | Freq: Three times a day (TID) | ORAL | Status: DC | PRN

## 2013-02-19 NOTE — Telephone Encounter (Signed)
Offer Doxycycline 100 mg, # 8, 2 today then one daily  Tessalon perles 100 mg, # 30, 1 every 8 hours as needed for cough

## 2013-02-19 NOTE — Telephone Encounter (Signed)
Spoke with pt and notified of recs per CDY She verbalized understanding and rxs were sent to pharm

## 2013-02-19 NOTE — Telephone Encounter (Signed)
Per 4.2.14 ov w/ CY: Patient Instructions    Neb xop 0.63  Depo 80  Order CXR Dx acute bronchitis  Samples Benicar 20 mg to take 1 daily x 2 weeks, instead of lisinopril We want to see if the chronic dry cough gets better. Dr Everardo All can consider a permanent change from Lisinopril      Called spoke with patient who reports that her symptoms are no better since last ov w/ rattling in her chest, increased SOB, wheezing, prod cough with deep yellow mucus.  Unable to sleep well and has to sleep sitting up.  Denies f/c/s.  Is requesting recs from CY.   Dr Maple Hudson please advise, thanks. [facility] [place] Allergies  Allergen Reactions  . Penicillins

## 2013-05-29 ENCOUNTER — Other Ambulatory Visit: Payer: Self-pay | Admitting: *Deleted

## 2013-05-29 MED ORDER — LISINOPRIL 40 MG PO TABS: 40.0000 mg | ORAL_TABLET | Freq: Every day | ORAL | Status: DC

## 2013-06-24 ENCOUNTER — Other Ambulatory Visit: Payer: Self-pay | Admitting: *Deleted

## 2013-06-24 MED ORDER — LEVOTHYROXINE SODIUM 100 MCG PO TABS: 100.0000 ug | ORAL_TABLET | Freq: Every day | ORAL | Status: DC

## 2013-08-26 ENCOUNTER — Other Ambulatory Visit: Payer: Self-pay | Admitting: Endocrinology

## 2013-10-01 ENCOUNTER — Ambulatory Visit (INDEPENDENT_AMBULATORY_CARE_PROVIDER_SITE_OTHER): Payer: Medicare Other | Admitting: Endocrinology

## 2013-10-01 ENCOUNTER — Encounter: Payer: Self-pay | Admitting: Endocrinology

## 2013-10-01 VITALS — BP 140/70 | HR 80 | Temp 97.5°F | Resp 20 | Ht 65.0 in | Wt 205.1 lb

## 2013-10-01 DIAGNOSIS — Z23 Encounter for immunization: Secondary | ICD-10-CM

## 2013-10-01 DIAGNOSIS — I10 Essential (primary) hypertension: Secondary | ICD-10-CM

## 2013-10-01 DIAGNOSIS — D649 Anemia, unspecified: Secondary | ICD-10-CM

## 2013-10-01 DIAGNOSIS — E7800 Pure hypercholesterolemia, unspecified: Secondary | ICD-10-CM

## 2013-10-01 DIAGNOSIS — Z79899 Other long term (current) drug therapy: Secondary | ICD-10-CM

## 2013-10-01 DIAGNOSIS — M810 Age-related osteoporosis without current pathological fracture: Secondary | ICD-10-CM

## 2013-10-01 DIAGNOSIS — E039 Hypothyroidism, unspecified: Secondary | ICD-10-CM

## 2013-10-01 DIAGNOSIS — N259 Disorder resulting from impaired renal tubular function, unspecified: Secondary | ICD-10-CM

## 2013-10-01 DIAGNOSIS — E559 Vitamin D deficiency, unspecified: Secondary | ICD-10-CM

## 2013-10-01 DIAGNOSIS — E119 Type 2 diabetes mellitus without complications: Secondary | ICD-10-CM

## 2013-10-01 DIAGNOSIS — M109 Gout, unspecified: Secondary | ICD-10-CM

## 2013-10-01 LAB — HEPATIC FUNCTION PANEL
Albumin: 3.6 g/dL (ref 3.5–5.2)
Alkaline Phosphatase: 44 U/L (ref 39–117)
Total Bilirubin: 0.7 mg/dL (ref 0.3–1.2)

## 2013-10-01 LAB — CBC WITH DIFFERENTIAL/PLATELET
Basophils Absolute: 0 10*3/uL (ref 0.0–0.1)
Eosinophils Absolute: 0.1 10*3/uL (ref 0.0–0.7)
HCT: 28.4 % — ABNORMAL LOW (ref 36.0–46.0)
Lymphocytes Relative: 16 % (ref 12.0–46.0)
MCHC: 33.6 g/dL (ref 30.0–36.0)
Neutrophils Relative %: 75.5 % (ref 43.0–77.0)
Platelets: 250 10*3/uL (ref 150.0–400.0)
RBC: 3.39 Mil/uL — ABNORMAL LOW (ref 3.87–5.11)
RDW: 14 % (ref 11.5–14.6)

## 2013-10-01 LAB — MICROALBUMIN / CREATININE URINE RATIO: Microalb Creat Ratio: 0.8 mg/g (ref 0.0–30.0)

## 2013-10-01 LAB — URINALYSIS, ROUTINE W REFLEX MICROSCOPIC
Ketones, ur: NEGATIVE
Leukocytes, UA: NEGATIVE
RBC / HPF: NONE SEEN (ref 0–?)
Specific Gravity, Urine: 1.025 (ref 1.000–1.030)
Total Protein, Urine: NEGATIVE
Urobilinogen, UA: 0.2 (ref 0.0–1.0)

## 2013-10-01 LAB — LIPID PANEL
LDL Cholesterol: 85 mg/dL (ref 0–99)
Total CHOL/HDL Ratio: 4
Triglycerides: 159 mg/dL — ABNORMAL HIGH (ref 0.0–149.0)

## 2013-10-01 LAB — IBC PANEL
Iron: 28 ug/dL — ABNORMAL LOW (ref 42–145)
Transferrin: 351.4 mg/dL (ref 212.0–360.0)

## 2013-10-01 LAB — BASIC METABOLIC PANEL
CO2: 25 mEq/L (ref 19–32)
Calcium: 9.2 mg/dL (ref 8.4–10.5)
Creatinine, Ser: 1.1 mg/dL (ref 0.4–1.2)
Glucose, Bld: 103 mg/dL — ABNORMAL HIGH (ref 70–99)
Potassium: 4.2 mEq/L (ref 3.5–5.1)

## 2013-10-01 LAB — HEMOGLOBIN A1C: Hgb A1c MFr Bld: 7.1 % — ABNORMAL HIGH (ref 4.6–6.5)

## 2013-10-01 MED ORDER — LEVOTHYROXINE SODIUM 100 MCG PO TABS: 100.0000 ug | ORAL_TABLET | Freq: Every day | ORAL | Status: DC

## 2013-10-01 NOTE — Patient Instructions (Addendum)
blood tests are being requested for you today.  We'll contact you with results. Based on the results, we hope to be able to reduce the metformin.  If necessary, we would add another medication. Please come in soon for a regular physical.

## 2013-10-01 NOTE — Progress Notes (Signed)
Subjective:    Patient ID: Destiny Flowers, female    DOB: Dec 29, 1928, 79 y.o.   MRN: [id number]  HPI The state of at least three ongoing medical problems is addressed today, with interval history of each noted here: GERD: pt is back on prilosec, and sxs are well-controlled Pt returns for f/u of type 2 DM (dx'ed 1997).  Pt says the metformin causes diarrhea.   Hypothyroidism: pt reports weight gain.   Past Medical History  Diagnosis Date  . DIABETES MELLITUS, TYPE II 06/06/2007  . GERD 06/06/2007  . HYPERCHOLESTEROLEMIA 02/19/2008  . HYPERTENSION 06/06/2007  . HYPOTHYROIDISM 06/06/2007  . OSTEOPOROSIS 06/06/2007  . RENAL INSUFFICIENCY 06/06/2007  . Dysthymic disorder   . Homocysteinemia   . ALLERGIC RHINITIS   . OSTEOARTHRITIS   . INSOMNIA   . DVT (deep venous thrombosis)     Left leg  . OBSTRUCTIVE SLEEP APNEA     No past surgical history on file.  History   Social History  . Marital Status: Single    Spouse Name: N/A    Number of Children: N/A  . Years of Education: N/A   Occupational History  . Not on file.   Social History Main Topics  . Smoking status: Passive Smoke Exposure - Never Smoker  . Smokeless tobacco: Not on file  . Alcohol Use: No  . Drug Use: No  . Sexual Activity:    Other Topics Concern  . Not on file   Social History Narrative  . No narrative on file    Current Outpatient Prescriptions on File Prior to Visit  Medication Sig Dispense Refill  . allopurinol (ZYLOPRIM) 100 MG tablet Take 100 mg by mouth daily.        Marland Kitchen aspirin 81 MG tablet Take 81 mg by mouth daily.        . Calcium Carbonate-Vitamin D (CALTRATE 600+D) 600-400 MG-UNIT per tablet Take 1 tablet by mouth 2 (two) times daily.        Blossom Hoops Iron (PERFECT IRON) 25 MG TABS Take by mouth as needed.        . Cholecalciferol (VITAMIN D3) 1000 UNITS CAPS Take by mouth daily.        . clotrimazole-betamethasone (LOTRISONE) cream 3x a day, as needed for rash  45 g  5  . colchicine 0.6 MG  tablet Take 0.6 mg by mouth as needed. For gout       . folic acid (FOLVITE) 400 MCG tablet Take 800 mcg by mouth daily.        Marland Kitchen lisinopril (PRINIVIL,ZESTRIL) 40 MG tablet Take 1 tablet (40 mg total) by mouth daily.  90 tablet  0  . Multiple Vitamin (MULTIVITAMIN) tablet Take 1 tablet by mouth daily.        Marland Kitchen omeprazole (PRILOSEC OTC) 20 MG tablet Take 20 mg by mouth daily.      . traMADol-acetaminophen (ULTRACET) 37.5-325 MG per tablet Take 1 tablet by mouth as needed. For pain        . traZODone (DESYREL) 100 MG tablet TAKE ONE TABLET BY MOUTH AT BEDTIME  90 tablet  3   No current facility-administered medications on file prior to visit.    Allergies  Allergen Reactions  . Penicillins     Family History  Problem Relation Age of Onset  . Stroke Mother   . Coronary artery disease Father     BP 140/70  Pulse 80  Temp(Src) 97.5 F (36.4 C) (Oral)  Resp 20  Ht 5\' 5"  (1.651 m)  Wt 205 lb 1.6 oz (93.033 kg)  BMI 34.13 kg/m2  Review of Systems Denies chest pain.  She has leg cramps.      Objective:   Physical Exam VITAL SIGNS:  See vs page GENERAL: no distress    Lab Results  Component Value Date   WBC 10.1 10/01/2013   HGB 9.5* 10/01/2013   HCT 28.4* 10/01/2013   PLT 250.0 10/01/2013   GLUCOSE 103* 10/01/2013   CHOL 161 10/01/2013   TRIG 159.0* 10/01/2013   HDL 44.30 10/01/2013   LDLCALC 85 10/01/2013   ALT 11 10/01/2013   AST 18 10/01/2013   NA 137 10/01/2013   K 4.2 10/01/2013   CL 103 10/01/2013   CREATININE 1.1 10/01/2013   BUN 37* 10/01/2013   CO2 25 10/01/2013   TSH 2.21 10/01/2013   HGBA1C 7.1* 10/01/2013   MICROALBUR 2.1* 10/01/2013      Assessment & Plan:  fe-deficiency anemia, persistent, uncertain etiology HTN: she may have a situational component DM: Needs increased rx, if it can be done with a regimen that avoids or minimizes hypoglycemia.  However, she does not want to add any more meds now. Diarrhea, prob due to metformin: i would like  to reduce, but she does not want to add additional meds. Hypothyroidism: well-replaced

## 2013-10-02 ENCOUNTER — Other Ambulatory Visit: Payer: Self-pay | Admitting: *Deleted

## 2013-10-02 LAB — VITAMIN D 25 HYDROXY (VIT D DEFICIENCY, FRACTURES): Vit D, 25-Hydroxy: 32 ng/mL (ref 30–89)

## 2013-10-02 LAB — PTH, INTACT AND CALCIUM
Calcium: 9.5 mg/dL (ref 8.4–10.5)
PTH: 92.4 pg/mL — ABNORMAL HIGH (ref 14.0–72.0)

## 2013-10-02 MED ORDER — METFORMIN HCL ER 500 MG PO TB24: ORAL_TABLET | ORAL | Status: DC

## 2013-10-02 MED ORDER — LEVOTHYROXINE SODIUM 100 MCG PO TABS: 100.0000 ug | ORAL_TABLET | Freq: Every day | ORAL | Status: DC

## 2013-10-07 ENCOUNTER — Encounter: Payer: Self-pay | Admitting: *Deleted

## 2013-10-14 ENCOUNTER — Other Ambulatory Visit: Payer: Self-pay | Admitting: Endocrinology

## 2013-10-31 ENCOUNTER — Telehealth: Payer: Self-pay | Admitting: Internal Medicine

## 2013-10-31 MED ORDER — OSELTAMIVIR PHOSPHATE 75 MG PO CAPS: 75.0000 mg | ORAL_CAPSULE | Freq: Two times a day (BID) | ORAL | Status: DC

## 2013-10-31 NOTE — Telephone Encounter (Signed)
I spoke with pt. She c/o prod cough w/ yellow phlem, loss of appetite, wheezing, chest tx x Sunday. No f/c/s/n/v. Please advise Dr. Maple Hudson thanks  Allergies  Allergen Reactions  . Penicillins

## 2013-10-31 NOTE — Telephone Encounter (Signed)
Per CDY okay for tamiflu #10 1 po BID  Pt is aware of recs and needed nothing further

## 2013-10-31 NOTE — Telephone Encounter (Signed)
Pt called again in ref to previous msg.Destiny Flowers

## 2013-12-27 ENCOUNTER — Other Ambulatory Visit: Payer: Self-pay

## 2013-12-27 MED ORDER — CLOTRIMAZOLE-BETAMETHASONE 1-0.05 % EX CREA: TOPICAL_CREAM | CUTANEOUS | Status: DC

## 2013-12-30 ENCOUNTER — Telehealth: Payer: Self-pay

## 2013-12-30 MED ORDER — TRAZODONE HCL 100 MG PO TABS: ORAL_TABLET | ORAL | Status: DC

## 2013-12-30 NOTE — Telephone Encounter (Signed)
Done

## 2013-12-30 NOTE — Telephone Encounter (Signed)
Please refill x 1 cpx is due 

## 2013-12-30 NOTE — Telephone Encounter (Signed)
Received fax from pharmacy requesting a refill for trazodone. Pt was last seen 10/01/2013 and medication was last filled on 12/06/2011.  Please advise, Thanks!

## 2014-01-15 ENCOUNTER — Telehealth: Payer: Self-pay | Admitting: Internal Medicine

## 2014-01-15 NOTE — Telephone Encounter (Signed)
CDY with no openings on 3.5.15 or 3.6.15 d/t meetings LMOM TCB x1 to discuss - can see TP ??

## 2014-01-16 MED ORDER — DOXYCYCLINE HYCLATE 100 MG PO CAPS: ORAL_CAPSULE | ORAL | Status: DC

## 2014-01-16 NOTE — Telephone Encounter (Signed)
Pt is calling in for an appt.  If pt can't see CY, can something be called in?  [facility] [place].  Antionette FairyHolly D Pryor

## 2014-01-16 NOTE — Telephone Encounter (Signed)
Called spoke with patient who c/o wheezing/gurgling, prod cough with yellow mucus, increased SOB, chest tightness, night sweats, head congestion w/ yellow drainage, PND x1 week.  Pt denies any fever, hemoptysis, nausea, vomiting.  Pt has not been treating with any OTC medications.  Pt is requesting rx be called [place] and [place] concerned about possible winter weather.    Last ov 4.2.14, follow up in 1 year >> upcoming appt scheduled for 4.2.15 [facility] [place] Allergies  Allergen Reactions  . Penicillins    Dr Maple HudsonYoung please advise, thank you.  Current Outpatient Prescriptions on File Prior to Visit  Medication Sig Dispense Refill  . allopurinol (ZYLOPRIM) 100 MG tablet Take 100 mg by mouth daily.        Marland Kitchen. aspirin 81 MG tablet Take 81 mg by mouth daily.        . Calcium Carbonate-Vitamin D (CALTRATE 600+D) 600-400 MG-UNIT per tablet Take 1 tablet by mouth 2 (two) times daily.        Blossom Hoops. Carbonyl Iron (PERFECT IRON) 25 MG TABS Take by mouth as needed.        . Cholecalciferol (VITAMIN D3) 1000 UNITS CAPS Take by mouth daily.        . clotrimazole-betamethasone (LOTRISONE) cream 3x a day, as needed for rash  45 g  5  . colchicine 0.6 MG tablet Take 0.6 mg by mouth as needed. For gout       . folic acid (FOLVITE) 400 MCG tablet Take 800 mcg by mouth daily.        Marland Kitchen. levothyroxine (SYNTHROID, LEVOTHROID) 100 MCG tablet Take 1 tablet (100 mcg total) by mouth daily before breakfast.  90 tablet  3  . lisinopril (PRINIVIL,ZESTRIL) 40 MG tablet TAKE ONE TABLET BY MOUTH ONCE DAILY  90 tablet  1  . metFORMIN (GLUCOPHAGE-XR) 500 MG 24 hr tablet TAKE FOUR TABLETS BY MOUTH ONCE DAILY IN THE MORNING  120 tablet  2  . Multiple Vitamin (MULTIVITAMIN) tablet Take 1 tablet by mouth daily.        Marland Kitchen. omeprazole (PRILOSEC OTC) 20 MG tablet Take 20 mg by mouth daily.      Marland Kitchen. oseltamivir (TAMIFLU) 75 MG capsule Take 1 capsule (75 mg total) by mouth 2 (two) times daily.  10 capsule  0  .  traMADol-acetaminophen (ULTRACET) 37.5-325 MG per tablet Take 1 tablet by mouth as needed. For pain        . traZODone (DESYREL) 100 MG tablet TAKE ONE TABLET BY MOUTH AT BEDTIME  30 tablet  1   No current facility-administered medications on file prior to visit.

## 2014-01-16 NOTE — Telephone Encounter (Signed)
Per CY-give Doxycycline 100 mg #8 take 2 today then 1 daily no refills and can also use Mucinex DM OTC. Thanks.

## 2014-01-16 NOTE — Telephone Encounter (Signed)
Spoke with the pt and notified of recs per CDY  She verbalized understanding and states no questions  Rx was sent to pharm

## 2014-01-16 NOTE — Telephone Encounter (Signed)
ATC line busy x 4 wcb 

## 2014-02-13 ENCOUNTER — Ambulatory Visit (INDEPENDENT_AMBULATORY_CARE_PROVIDER_SITE_OTHER)
Admission: RE | Admit: 2014-02-13 | Discharge: 2014-02-13 | Disposition: A | Discharge status: Home / Self Care | Payer: Medicare Other | Source: Ambulatory Visit | Attending: Internal Medicine | Admitting: Internal Medicine

## 2014-02-13 ENCOUNTER — Ambulatory Visit (INDEPENDENT_AMBULATORY_CARE_PROVIDER_SITE_OTHER): Payer: Medicare Other | Admitting: Internal Medicine

## 2014-02-13 ENCOUNTER — Encounter: Payer: Self-pay | Admitting: Internal Medicine

## 2014-02-13 VITALS — BP 138/74 | HR 75 | Ht 64.75 in | Wt 209.6 lb

## 2014-02-13 DIAGNOSIS — J42 Unspecified chronic bronchitis: Secondary | ICD-10-CM

## 2014-02-13 DIAGNOSIS — J449 Chronic obstructive pulmonary disease, unspecified: Secondary | ICD-10-CM

## 2014-02-13 MED ORDER — CLARITHROMYCIN 500 MG PO TABS: ORAL_TABLET | ORAL | Status: DC

## 2014-02-13 NOTE — Patient Instructions (Signed)
Script for biaxin antibiotic  Order- CXR   Dx chronic bronchitis

## 2014-02-13 NOTE — Progress Notes (Signed)
Patient ID: Destiny Flowers, female    DOB: [birth year], 80 y.o.   MRN: [id number]  HPI 07/14/11- 80 year old female with history of passive smoking, followed for obstructive sleep apnea, allergic rhinitis, complicated by hypothyroid, DM, HBP, renal insufficiency Last here January 07, 2011. CPAP 11- wakes and takes it off in sleep, feeling somebody has "got" her. Discussed going to the [facility] in [place] so that she can discuss mask fitting and machine issues directly with her home care company.. She feels confined by the mask,not smothered by the pressure..  Left leg- the side that had remote DVT- has noted a bit of painless swelling on dorsum of left foot.  Coughs a little clear mucus in the morning. Can't walk easily and asks for handicaped parking.   11/11/11- 80 year old female with history of passive smoking, followed for obstructive sleep apnea, allergic rhinitis, complicated by hypothyroid, DM, HBP, renal insufficiency. Friend here. Acute visit-haorseness, cough-yellow in color; increased SOB since Christmas eve; fever and chills. 4 days blowing and cough. Persistent nasal congestion. Denies sore throat, fever, GI upset. Nothing purulent or painful.  02/13/13- 80 year old female with history of passive smoking, followed for obstructive sleep apnea, allergic rhinitis, complicated by hypothyroid, DM, HBP, renal insufficiency.  FOLLOWS YSA:YTKZ seen 2012; cough-productive and deep-yellow in color; SOB and wheezing as well. Acute visit- 4 days increased cough, nasal congestion, rhinorhea, yellow. No fever, sore throat.  Blames pollen. Also aware of water problem under house- being worked on.  Baseline bothersome dry cough- mild. We discussed lisinopril/ ACEI  taken for renal preservation. CXR 11/24/11 IMPRESSION:  1. Airway thickening may reflect bronchitis or reactive airways  disease. No airspace opacity is identified to suggest bacterial  pneumonia pattern.  2. Moderate  sized hiatal hernia.  Original Report Authenticated By: Dellia Cloud, M.D.  02/13/14- 80 year old female with history of passive smoking, followed for obstructive sleep apnea, allergic rhinitis, complicated by hypothyroid, DM, HBP, renal insufficiency.  FOLLOWS FOR:  Still having cough with light yellow mucus   Persistent cough. Z-Pak then doxycycline did not help. Sputum remains yellow. History of penicillin allergy with swelling. CXR 02/14/13 IMPRESSION:  No acute cardiopulmonary abnormality.  Original Report Authenticated By: Janeece Riggers, M.D.  Review of Systems-see HPI Constitutional:  No-weight loss,  No-night sweats, fevers, chills, fatigue, lassitude. HEENT:   No-  headaches, difficulty swallowing, tooth/dental problems, sore throat,       +  sneezing, itching, ear ache, nasal congestion, post nasal drip,  CV:  No-   chest pain, orthopnea, PND, swelling in lower extremities, anasarca, dizziness, palpitations Resp: Some acute  shortness of breath with exertion or at rest.              + productive cough,  +non-productive cough,  No-  coughing up of blood.              + change in color of mucus.  No- wheezing.   Skin: No-   rash or lesions. GI:  No-   heartburn, indigestion, abdominal pain, nausea, vomiting, GU:  MS:  No-   joint pain, + swelling.  . Neuro- nothing unusual Psych:  No- change in mood or affect. No depression or anxiety.  No memory loss.  Objective:   Physical Exam  General- Alert, Oriented, Affect-appropriate, Distress- none acute   Overweight,  Talkative, elderly Skin- rash-none, lesions- none, excoriation- none Lymphadenopathy- none Head- atraumatic            Eyes- Gross  vision intact, PERRLA, conjunctivae clear secretions            Ears- Hearing, canals normal            Nose- +sniffing, No- Septal dev, mucus, polyps, erosion, perforation             Throat- Mallampati II , mucosa clear , drainage- none, tonsils- atrophic Neck- flexible , trachea  midline, no stridor , thyroid nl, carotid no bruit Chest - symmetrical excursion , unlabored           Heart/CV- RRR , no murmur , no gallop  , no rub, nl s1 s2                           - JVD- none , +edema- trace, dorsum left foot, stasis changes- none, varices-                           none           Lung- clear, wheeze- none, cough-none , dullness-none, rub- none. +Unremarkable exam                        today           Chest wall-  Abd-  Br/ Gen/ Rectal- Not done, not indicated Extrem- cyanosis- none, clubbing, none, atrophy- none, strength- nl     Neuro- grossly intact to observation

## 2014-02-17 NOTE — Progress Notes (Signed)
Quick Note:  [id number] ______ 

## 2014-02-18 NOTE — Progress Notes (Signed)
Quick Note:  Called and spoke to pt regarding your results. Pt verbalized understanding and denied any further questions or concerns at this time. ______

## 2014-03-16 NOTE — Assessment & Plan Note (Signed)
Productive cough with yellow sputum would be atypical for an ACE inhibitor induced cough but we will have to come back to that if nothing else makes a difference. Plan-Biaxin, chest x-ray

## 2014-04-04 ENCOUNTER — Ambulatory Visit (INDEPENDENT_AMBULATORY_CARE_PROVIDER_SITE_OTHER): Payer: Medicare Other | Admitting: Endocrinology

## 2014-04-04 ENCOUNTER — Encounter: Payer: Self-pay | Admitting: Endocrinology

## 2014-04-04 ENCOUNTER — Ambulatory Visit (HOSPITAL_COMMUNITY): Payer: Medicare Other | Attending: Endocrinology | Admitting: Cardiology

## 2014-04-04 VITALS — BP 122/70 | HR 77 | Temp 97.5°F | Ht 64.75 in | Wt 207.0 lb

## 2014-04-04 DIAGNOSIS — R609 Edema, unspecified: Secondary | ICD-10-CM

## 2014-04-04 DIAGNOSIS — M25569 Pain in unspecified knee: Secondary | ICD-10-CM

## 2014-04-04 DIAGNOSIS — Z86718 Personal history of other venous thrombosis and embolism: Secondary | ICD-10-CM | POA: Insufficient documentation

## 2014-04-04 DIAGNOSIS — E119 Type 2 diabetes mellitus without complications: Secondary | ICD-10-CM

## 2014-04-04 DIAGNOSIS — D649 Anemia, unspecified: Secondary | ICD-10-CM

## 2014-04-04 DIAGNOSIS — N259 Disorder resulting from impaired renal tubular function, unspecified: Secondary | ICD-10-CM

## 2014-04-04 LAB — BASIC METABOLIC PANEL
BUN: 26 mg/dL — ABNORMAL HIGH (ref 6–23)
CO2: 28 mEq/L (ref 19–32)
Calcium: 8.8 mg/dL (ref 8.4–10.5)
Chloride: 101 mEq/L (ref 96–112)
Creatinine, Ser: 1 mg/dL (ref 0.4–1.2)
GFR: 54.4 mL/min — AB (ref >60.00)
Glucose, Bld: 99 mg/dL (ref 70–99)
POTASSIUM: 4.5 meq/L (ref 3.5–5.1)
SODIUM: 138 meq/L (ref 135–145)

## 2014-04-04 LAB — CBC WITH DIFFERENTIAL/PLATELET
BASOS PCT: 0.5 % (ref 0.0–3.0)
Basophils Absolute: 0 10*3/uL (ref 0.0–0.1)
EOS PCT: 2.2 % (ref 0.0–5.0)
Eosinophils Absolute: 0.2 10*3/uL (ref 0.0–0.7)
HCT: 37 % (ref 36.0–46.0)
HEMOGLOBIN: 12.3 g/dL (ref 12.0–15.0)
LYMPHS PCT: 22 % (ref 12.0–46.0)
Lymphs Abs: 1.6 10*3/uL (ref 0.7–4.0)
MCHC: 33.3 g/dL (ref 30.0–36.0)
MCV: 90.2 fl (ref 78.0–100.0)
Monocytes Absolute: 0.7 10*3/uL (ref 0.1–1.0)
Monocytes Relative: 9.5 % (ref 3.0–12.0)
NEUTROS ABS: 4.6 10*3/uL (ref 1.4–7.7)
NEUTROS PCT: 65.8 % (ref 43.0–77.0)
Platelets: 244 10*3/uL (ref 150.0–400.0)
RBC: 4.11 Mil/uL (ref 3.87–5.11)
RDW: 14.4 % (ref 11.5–15.5)
WBC: 7.1 10*3/uL (ref 4.0–10.5)

## 2014-04-04 LAB — IBC PANEL
Iron: 64 ug/dL (ref 42–145)
SATURATION RATIOS: 15 % — AB (ref 20.0–50.0)
TRANSFERRIN: 305.4 mg/dL (ref 212.0–360.0)

## 2014-04-04 LAB — HEMOGLOBIN A1C: Hgb A1c MFr Bld: 6.9 % — ABNORMAL HIGH (ref 4.6–6.5)

## 2014-04-04 NOTE — Progress Notes (Signed)
Subjective:    Patient ID: Destiny Flowers, female    DOB: 1929/02/16, 80 y.o.   MRN: [id number]  HPI Pt states 1 month of moderate exacerbation of chronic swelling of the legs, and assoc pain.   Past Medical History  Diagnosis Date  . DIABETES MELLITUS, TYPE II 06/06/2007  . GERD 06/06/2007  . HYPERCHOLESTEROLEMIA 02/19/2008  . HYPERTENSION 06/06/2007  . HYPOTHYROIDISM 06/06/2007  . OSTEOPOROSIS 06/06/2007  . RENAL INSUFFICIENCY 06/06/2007  . Dysthymic disorder   . Homocysteinemia   . ALLERGIC RHINITIS   . OSTEOARTHRITIS   . INSOMNIA   . DVT (deep venous thrombosis)     Left leg  . OBSTRUCTIVE SLEEP APNEA     No past surgical history on file.  History   Social History  . Marital Status: Single    Spouse Name: N/A    Number of Children: N/A  . Years of Education: N/A   Occupational History  . Not on file.   Social History Main Topics  . Smoking status: Passive Smoke Exposure - Never Smoker  . Smokeless tobacco: Not on file  . Alcohol Use: No  . Drug Use: No  . Sexual Activity:    Other Topics Concern  . Not on file   Social History Narrative  . No narrative on file    Current Outpatient Prescriptions on File Prior to Visit  Medication Sig Dispense Refill  . allopurinol (ZYLOPRIM) 100 MG tablet Take 100 mg by mouth. As needed for gout      . aspirin 81 MG tablet Take 81 mg by mouth daily.        . Calcium Carbonate-Vitamin D (CALTRATE 600+D) 600-400 MG-UNIT per tablet Take 1 tablet by mouth 2 (two) times daily.        Blossom Hoops Iron (PERFECT IRON) 25 MG TABS Take by mouth as needed.        . Cholecalciferol (VITAMIN D3) 1000 UNITS CAPS Take by mouth daily.        . clarithromycin (BIAXIN) 500 MG tablet 1 twice daily after meals  14 tablet  0  . clotrimazole-betamethasone (LOTRISONE) cream 3x a day, as needed for rash  45 g  5  . colchicine 0.6 MG tablet Take 0.6 mg by mouth as needed. For gout       . folic acid (FOLVITE) 400 MCG tablet Take 800 mcg by mouth  daily.        Marland Kitchen levothyroxine (SYNTHROID, LEVOTHROID) 100 MCG tablet Take 1 tablet (100 mcg total) by mouth daily before breakfast.  90 tablet  3  . lisinopril (PRINIVIL,ZESTRIL) 40 MG tablet TAKE ONE TABLET BY MOUTH ONCE DAILY  90 tablet  1  . metFORMIN (GLUCOPHAGE-XR) 500 MG 24 hr tablet TAKE FOUR TABLETS BY MOUTH ONCE DAILY IN THE MORNING  120 tablet  2  . Multiple Vitamin (MULTIVITAMIN) tablet Take 1 tablet by mouth daily.        Marland Kitchen omeprazole (PRILOSEC OTC) 20 MG tablet Take 20 mg by mouth daily.      . traMADol-acetaminophen (ULTRACET) 37.5-325 MG per tablet Take 1 tablet by mouth as needed. For pain        . traZODone (DESYREL) 100 MG tablet TAKE ONE TABLET BY MOUTH AT BEDTIME  30 tablet  1   No current facility-administered medications on file prior to visit.    Allergies  Allergen Reactions  . Penicillins     Family History  Problem Relation Age of Onset  .  Stroke Mother   . Coronary artery disease Father     BP 122/70  Pulse 77  Temp(Src) 97.5 F (36.4 C) (Oral)  Ht 5' 4.75" (1.645 m)  Wt 207 lb (93.895 kg)  BMI 34.70 kg/m2  SpO2 97%    Review of Systems  Constitutional: Negative for unexpected weight change.  Eyes: Negative for visual disturbance.  Respiratory: Negative for shortness of breath.   Cardiovascular: Negative for chest pain.  Gastrointestinal: Negative for anal bleeding.  Endocrine: Positive for cold intolerance.  Genitourinary: Negative for hematuria.  Musculoskeletal: Negative for back pain.  Skin: Negative for rash.  Allergic/Immunologic: Negative for environmental allergies.  Neurological: Negative for syncope and numbness.  Hematological: Does not bruise/bleed easily.  Psychiatric/Behavioral: Negative for dysphoric mood.       Objective:   Physical Exam VITAL SIGNS:  See vs page GENERAL: no distress.  Obese Pulses: dorsalis pedis intact bilat.   Feet: no deformity. normal color and temp.  2+ bilat leg edema Skin:  no ulcer on the  feet.   Neuro: sensation is intact to touch on the feet.    outside test results are reviewed: A1c=6.6 Bone-density: normal Vit-D=24 Lab Results  Component Value Date   CREATININE 1.0 04/04/2014   BUN 26* 04/04/2014   NA 138 04/04/2014   K 4.5 04/04/2014   CL 101 04/04/2014   CO2 28 04/04/2014       Assessment & Plan:  Edema: moderate exacerbation.  She needs stat venous dopplers to exclude life-threatening DVT DM: well-controlled: pt advised to continue same medication. Vit-D deficiency: new Renal insuff: this is a relative contraindication to diuretic rx   Patient Instructions  blood tests are being requested for you today.  We'll contact you with results.   You should take a vitamin-D pill, 2000 units per day.  Please come in for a regular physical in 6 months.      Cardiac Diet A cardiac diet can help stop heart disease or a stroke from happening. It involves eating less unhealthy fats and eating more healthy fats.  FOODS TO AVOID OR LIMIT  Limit saturated fats. This type of fat is found in oils and dairy products, such as:  Coconut oil.  Palm oil.  Cocoa butter.  Butter.  Avoid trans-fat or hydrogenated oils. These are found in fried or pre-made baked goods, such as:  Margarine.  Pre-made cookies, cakes, and crackers.  Limit processed meats (hot dogs, deli meats, sausage) to 3 ounces a week.  Limit high-fat meats (marbled meats, fried chicken, or chicken with skin) to 3 ounces a week.  Limit salt (sodium) to 1500 milligrams a day.   Limit sweets and drinks with added sugar to no more than 5 servings a week. One serving is:  1 tablespoon of sugar.  1 tablespoon of jelly or jam.   cup sorbet.  1 cup lemonade.   cup regular soda. EAT MORE OF THE FOLLOWING FOODS Fruit  Eat 4to 5 servings a day. One serving of fruit is:  1 medium whole fruit.   cup dried fruit.   cup of fresh, frozen, or canned fruit.   cup 100% fruit  juice. Vegetables  Eat 4 to 5 servings a day. One serving is:  1 cup raw leafy vegetables.   cup raw or cooked, cut-up vegetables.   cup vegetable juice. Whole Grains  Eat 3 servings a day (1 ounce equals 1 serving). Legumes (such as beans, peas, and lentils)   Eat at least  4 servings a week ( cup equals 1 serving). Nuts and Seeds   Eat at least 4 servings a week ( cup equals 1 serving). Dietary Fiber  Eat 20 to 30 grams a day. Some foods high in dietary fiber include:  Dried beans.  Citrus fruits.  Apples, bananas.  Broccoli, Brussels sprouts, and eggplant.  Oats. Omega-3 Fats  Eat food with omega-3 fats. You can also take a dietary pill (supplement) that has 1 gram of DHA and EPA. Have 3.5 ounces of fatty fish a week, such as:  Salmon.  Mackerel.  Albacore tuna.  Sardines.  Lake trout.  Herring. PREPARING YOUR FOOD  Broil, bake, steam, or roast foods. Do not fry food. Do not cook food in butter (fat).  Use non-stick cooking sprays.  Remove skin from poultry, such as chicken and [place].  Remove fat from meat.  Take the fat off the top of stews, soups, and gravy.  Use lemon or herbs to flavor food instead of using butter or margarine.  Use nonfat yogurt, salsa, or low-fat dressings for salads. Document Released: 05/01/2012 Document Reviewed: 05/01/2012 [facility] Patient Information 2014 [place], [place].

## 2014-04-04 NOTE — Progress Notes (Signed)
LE Venous duplex performed, bilateral

## 2014-04-04 NOTE — Patient Instructions (Addendum)
blood tests are being requested for you today.  We'll contact you with results.   You should take a vitamin-D pill, 2000 units per day.  Please come in for a regular physical in 6 months.      Cardiac Diet A cardiac diet can help stop heart disease or a stroke from happening. It involves eating less unhealthy fats and eating more healthy fats.  FOODS TO AVOID OR LIMIT  Limit saturated fats. This type of fat is found in oils and dairy products, such as:  Coconut oil.  Palm oil.  Cocoa butter.  Butter.  Avoid trans-fat or hydrogenated oils. These are found in fried or pre-made baked goods, such as:  Margarine.  Pre-made cookies, cakes, and crackers.  Limit processed meats (hot dogs, deli meats, sausage) to 3 ounces a week.  Limit high-fat meats (marbled meats, fried chicken, or chicken with skin) to 3 ounces a week.  Limit salt (sodium) to 1500 milligrams a day.   Limit sweets and drinks with added sugar to no more than 5 servings a week. One serving is:  1 tablespoon of sugar.  1 tablespoon of jelly or jam.   cup sorbet.  1 cup lemonade.   cup regular soda. EAT MORE OF THE FOLLOWING FOODS Fruit  Eat 4to 5 servings a day. One serving of fruit is:  1 medium whole fruit.   cup dried fruit.   cup of fresh, frozen, or canned fruit.   cup 100% fruit juice. Vegetables  Eat 4 to 5 servings a day. One serving is:  1 cup raw leafy vegetables.   cup raw or cooked, cut-up vegetables.   cup vegetable juice. Whole Grains  Eat 3 servings a day (1 ounce equals 1 serving). Legumes (such as beans, peas, and lentils)   Eat at least 4 servings a week ( cup equals 1 serving). Nuts and Seeds   Eat at least 4 servings a week ( cup equals 1 serving). Dietary Fiber  Eat 20 to 30 grams a day. Some foods high in dietary fiber include:  Dried beans.  Citrus fruits.  Apples, bananas.  Broccoli, Brussels sprouts, and eggplant.  Oats. Omega-3  Fats  Eat food with omega-3 fats. You can also take a dietary pill (supplement) that has 1 gram of DHA and EPA. Have 3.5 ounces of fatty fish a week, such as:  Salmon.  Mackerel.  Albacore tuna.  Sardines.  Lake trout.  Herring. PREPARING YOUR FOOD  Broil, bake, steam, or roast foods. Do not fry food. Do not cook food in butter (fat).  Use non-stick cooking sprays.  Remove skin from poultry, such as chicken and [place].  Remove fat from meat.  Take the fat off the top of stews, soups, and gravy.  Use lemon or herbs to flavor food instead of using butter or margarine.  Use nonfat yogurt, salsa, or low-fat dressings for salads. Document Released: 05/01/2012 Document Reviewed: 05/01/2012 [facility] Patient Information 2014 [place], [place].

## 2014-07-04 ENCOUNTER — Other Ambulatory Visit: Payer: Self-pay | Admitting: Endocrinology

## 2014-07-28 ENCOUNTER — Other Ambulatory Visit: Payer: Self-pay | Admitting: Endocrinology

## 2014-09-08 ENCOUNTER — Encounter: Payer: Self-pay | Admitting: Endocrinology

## 2014-09-08 ENCOUNTER — Ambulatory Visit (INDEPENDENT_AMBULATORY_CARE_PROVIDER_SITE_OTHER): Payer: Medicare Other | Admitting: Endocrinology

## 2014-09-08 ENCOUNTER — Telehealth: Payer: Self-pay | Admitting: Endocrinology

## 2014-09-08 VITALS — BP 151/90 | HR 81 | Temp 97.9°F | Ht 64.75 in | Wt 208.0 lb

## 2014-09-08 DIAGNOSIS — Z0189 Encounter for other specified special examinations: Secondary | ICD-10-CM

## 2014-09-08 DIAGNOSIS — E039 Hypothyroidism, unspecified: Secondary | ICD-10-CM

## 2014-09-08 DIAGNOSIS — Z23 Encounter for immunization: Secondary | ICD-10-CM

## 2014-09-08 DIAGNOSIS — M810 Age-related osteoporosis without current pathological fracture: Secondary | ICD-10-CM

## 2014-09-08 DIAGNOSIS — E1122 Type 2 diabetes mellitus with diabetic chronic kidney disease: Secondary | ICD-10-CM

## 2014-09-08 DIAGNOSIS — N259 Disorder resulting from impaired renal tubular function, unspecified: Secondary | ICD-10-CM

## 2014-09-08 DIAGNOSIS — I10 Essential (primary) hypertension: Secondary | ICD-10-CM | POA: Diagnosis not present

## 2014-09-08 DIAGNOSIS — Z0000 Encounter for general adult medical examination without abnormal findings: Secondary | ICD-10-CM

## 2014-09-08 DIAGNOSIS — D649 Anemia, unspecified: Secondary | ICD-10-CM

## 2014-09-08 DIAGNOSIS — N189 Chronic kidney disease, unspecified: Secondary | ICD-10-CM

## 2014-09-08 DIAGNOSIS — M1A9XX0 Chronic gout, unspecified, without tophus (tophi): Secondary | ICD-10-CM

## 2014-09-08 LAB — CBC WITH DIFFERENTIAL/PLATELET
BASOS ABS: 0 10*3/uL (ref 0.0–0.1)
BASOS PCT: 0.6 % (ref 0.0–3.0)
Eosinophils Absolute: 0.1 10*3/uL (ref 0.0–0.7)
Eosinophils Relative: 2.1 % (ref 0.0–5.0)
HCT: 40.6 % (ref 36.0–46.0)
HEMOGLOBIN: 13.2 g/dL (ref 12.0–15.0)
LYMPHS PCT: 21.6 % (ref 12.0–46.0)
Lymphs Abs: 1.4 10*3/uL (ref 0.7–4.0)
MCHC: 32.6 g/dL (ref 30.0–36.0)
MCV: 89.3 fl (ref 78.0–100.0)
Monocytes Absolute: 0.6 10*3/uL (ref 0.1–1.0)
Monocytes Relative: 9.6 % (ref 3.0–12.0)
NEUTROS ABS: 4.2 10*3/uL (ref 1.4–7.7)
Neutrophils Relative %: 66.1 % (ref 43.0–77.0)
Platelets: 289 10*3/uL (ref 150.0–400.0)
RBC: 4.55 Mil/uL (ref 3.87–5.11)
RDW: 13.1 % (ref 11.5–15.5)
WBC: 6.3 10*3/uL (ref 4.0–10.5)

## 2014-09-08 LAB — HEPATIC FUNCTION PANEL
ALK PHOS: 47 U/L (ref 39–117)
ALT: 12 U/L (ref 0–35)
AST: 19 U/L (ref 0–37)
Albumin: 3.4 g/dL — ABNORMAL LOW (ref 3.5–5.2)
BILIRUBIN TOTAL: 0.4 mg/dL (ref 0.2–1.2)
Bilirubin, Direct: 0 mg/dL (ref 0.0–0.3)
Total Protein: 7.3 g/dL (ref 6.0–8.3)

## 2014-09-08 LAB — MICROALBUMIN / CREATININE URINE RATIO
CREATININE, U: 202.4 mg/dL
MICROALB UR: 1.4 mg/dL (ref 0.0–1.9)
Microalb Creat Ratio: 0.7 mg/g (ref 0.0–30.0)

## 2014-09-08 LAB — HEMOGLOBIN A1C: HEMOGLOBIN A1C: 7.1 % — AB (ref 4.6–6.5)

## 2014-09-08 LAB — URINALYSIS, ROUTINE W REFLEX MICROSCOPIC
Bilirubin Urine: NEGATIVE
Hgb urine dipstick: NEGATIVE
KETONES UR: NEGATIVE
Nitrite: NEGATIVE
PH: 5.5 (ref 5.0–8.0)
RBC / HPF: NONE SEEN (ref 0–?)
SPECIFIC GRAVITY, URINE: 1.025 (ref 1.000–1.030)
Total Protein, Urine: NEGATIVE
Urine Glucose: NEGATIVE
Urobilinogen, UA: 0.2 (ref 0.0–1.0)

## 2014-09-08 LAB — BASIC METABOLIC PANEL
BUN: 22 mg/dL (ref 6–23)
CALCIUM: 9.4 mg/dL (ref 8.4–10.5)
CO2: 25 meq/L (ref 19–32)
Chloride: 101 mEq/L (ref 96–112)
Creatinine, Ser: 1.1 mg/dL (ref 0.4–1.2)
GFR: 53.15 mL/min — AB (ref >60.00)
Glucose, Bld: 105 mg/dL — ABNORMAL HIGH (ref 70–99)
Potassium: 4.2 mEq/L (ref 3.5–5.1)
SODIUM: 136 meq/L (ref 135–145)

## 2014-09-08 LAB — IBC PANEL
Iron: 73 ug/dL (ref 42–145)
SATURATION RATIOS: 16.2 % — AB (ref 20.0–50.0)
Transferrin: 321.8 mg/dL (ref 212.0–360.0)

## 2014-09-08 LAB — TSH: TSH: 0.88 u[IU]/mL (ref 0.35–4.50)

## 2014-09-08 LAB — URIC ACID: Uric Acid, Serum: 7.6 mg/dL — ABNORMAL HIGH (ref 2.4–7.0)

## 2014-09-08 MED ORDER — DOXYCYCLINE HYCLATE 100 MG PO TABS: 100.0000 mg | ORAL_TABLET | Freq: Two times a day (BID) | ORAL | Status: DC

## 2014-09-08 MED ORDER — FUROSEMIDE 20 MG PO TABS: ORAL_TABLET | ORAL | Status: DC

## 2014-09-08 NOTE — Patient Instructions (Signed)
i have sent 2 prescriptions to your pharmacy: for an antibiotic pill, and a fluid pill.   blood tests are being requested for you today.  We'll contact you with results. You will get better much faster if you elevate your left leg and foot above the rest of your body. Please come back in soon for a regular physical.

## 2014-09-08 NOTE — Telephone Encounter (Signed)
Ov now 

## 2014-09-08 NOTE — Telephone Encounter (Signed)
See below and please advise, Thanks!  

## 2014-09-08 NOTE — Telephone Encounter (Signed)
Contacted pt. Pt on her way to office now. Front desk notified to add her to schedule once she arrives.

## 2014-09-08 NOTE — Telephone Encounter (Signed)
she is soo tired with left leg swelling it is hot to the touch with redness right above the ankle. Please advise

## 2014-09-08 NOTE — Progress Notes (Signed)
Subjective:    Patient ID: Destiny Flowers, female    DOB: 01/01/1929, 80 y.o.   Danelle EarthlyMRN: [id number]  HPI Pt states 1 week of slight exac of chronic swelling at the left leg, and assoc pain.  She has had venous dopplers for this in the past.   Past Medical History  Diagnosis Date  . DIABETES MELLITUS, TYPE II 06/06/2007  . GERD 06/06/2007  . HYPERCHOLESTEROLEMIA 02/19/2008  . HYPERTENSION 06/06/2007  . HYPOTHYROIDISM 06/06/2007  . OSTEOPOROSIS 06/06/2007  . RENAL INSUFFICIENCY 06/06/2007  . Dysthymic disorder   . Homocysteinemia   . ALLERGIC RHINITIS   . OSTEOARTHRITIS   . INSOMNIA   . DVT (deep venous thrombosis)     Left leg  . OBSTRUCTIVE SLEEP APNEA     No past surgical history on file.  History   Social History  . Marital Status: Single    Spouse Name: N/A    Number of Children: N/A  . Years of Education: N/A   Occupational History  . Not on file.   Social History Main Topics  . Smoking status: Passive Smoke Exposure - Never Smoker  . Smokeless tobacco: Not on file  . Alcohol Use: No  . Drug Use: No  . Sexual Activity:    Other Topics Concern  . Not on file   Social History Narrative  . No narrative on file    Current Outpatient Prescriptions on File Prior to Visit  Medication Sig Dispense Refill  . allopurinol (ZYLOPRIM) 100 MG tablet Take 100 mg by mouth. As needed for gout      . aspirin 81 MG tablet Take 81 mg by mouth daily.        . Calcium Carbonate-Vitamin D (CALTRATE 600+D) 600-400 MG-UNIT per tablet Take 1 tablet by mouth 2 (two) times daily.        Blossom Hoops. Carbonyl Iron (PERFECT IRON) 25 MG TABS Take by mouth as needed.        . Cholecalciferol (VITAMIN D3) 1000 UNITS CAPS Take by mouth daily.        . clarithromycin (BIAXIN) 500 MG tablet 1 twice daily after meals  14 tablet  0  . clotrimazole-betamethasone (LOTRISONE) cream 3x a day, as needed for rash  45 g  5  . colchicine 0.6 MG tablet Take 0.6 mg by mouth as needed. For gout       . folic acid  (FOLVITE) 400 MCG tablet Take 800 mcg by mouth daily.        Marland Kitchen. levothyroxine (SYNTHROID, LEVOTHROID) 100 MCG tablet Take 1 tablet (100 mcg total) by mouth daily before breakfast.  90 tablet  3  . lisinopril (PRINIVIL,ZESTRIL) 40 MG tablet TAKE ONE TABLET BY MOUTH ONCE DAILY  90 tablet  0  . metFORMIN (GLUCOPHAGE-XR) 500 MG 24 hr tablet TAKE FOUR TABLETS BY MOUTH ONCE DAILY IN THE MORNING  360 tablet  0  . Multiple Vitamin (MULTIVITAMIN) tablet Take 1 tablet by mouth daily.        Marland Kitchen. omeprazole (PRILOSEC OTC) 20 MG tablet Take 20 mg by mouth daily.      . traMADol-acetaminophen (ULTRACET) 37.5-325 MG per tablet Take 1 tablet by mouth as needed. For pain        . traZODone (DESYREL) 100 MG tablet TAKE ONE TABLET BY MOUTH AT BEDTIME  30 tablet  1   No current facility-administered medications on file prior to visit.    Allergies  Allergen Reactions  . Penicillins  Family History  Problem Relation Age of Onset  . Stroke Mother   . Coronary artery disease Father     BP 151/90  Pulse 81  Temp(Src) 97.9 F (36.6 C) (Oral)  Ht 5' 4.75" (1.645 m)  Wt 208 lb (94.348 kg)  BMI 34.87 kg/m2  SpO2 95%   Review of Systems She has slight redness of the left leg, but no fever.    Objective:   Physical Exam VITAL SIGNS:  See vs page GENERAL: no distress Left leg: 1+ edema There is minimal erythema and tenderness.  No ulcer.   Gait: normal and steady.     Lab Results  Component Value Date   WBC 6.3 09/08/2014   HGB 13.2 09/08/2014   HCT 40.6 09/08/2014   PLT 289.0 09/08/2014   GLUCOSE 105* 09/08/2014   CHOL 179 09/08/2014   TRIG 151.0* 09/08/2014   HDL 44.10 09/08/2014   LDLCALC 105* 09/08/2014   ALT 12 09/08/2014   AST 19 09/08/2014   NA 136 09/08/2014   K 4.2 09/08/2014   CL 101 09/08/2014   CREATININE 1.1 09/08/2014   BUN 22 09/08/2014   CO2 25 09/08/2014   TSH 0.88 09/08/2014   HGBA1C 7.1* 09/08/2014   MICROALBUR 1.4 09/08/2014  (i reviewed LE venous doppler  results).    Assessment & Plan:  DM: Needs increased rx, if it can be done with a regimen that avoids or minimizes hypoglycemia. Hyperuricemia: she needs increased rx.  Leg swelling: ? Early cellulitis.    Patient is advised the following: Patient Instructions  i have sent 2 prescriptions to your pharmacy: for an antibiotic pill, and a fluid pill.   blood tests are being requested for you today.  We'll contact you with results. You will get better much faster if you elevate your left leg and foot above the rest of your body. Please come back in soon for a regular physical.

## 2014-09-09 LAB — LIPID PANEL
Cholesterol: 179 mg/dL (ref 0–200)
HDL: 44.1 mg/dL (ref >39.00)
LDL Cholesterol: 105 mg/dL — ABNORMAL HIGH (ref 0–99)
NONHDL: 134.9
Total CHOL/HDL Ratio: 4
Triglycerides: 151 mg/dL — ABNORMAL HIGH (ref 0.0–149.0)
VLDL: 30.2 mg/dL (ref 0.0–40.0)

## 2014-09-09 LAB — PTH, INTACT AND CALCIUM
Calcium: 9.5 mg/dL (ref 8.4–10.5)
PTH: 58 pg/mL (ref 14–64)

## 2014-09-09 MED ORDER — SITAGLIPTIN PHOSPHATE 100 MG PO TABS: 100.0000 mg | ORAL_TABLET | Freq: Every day | ORAL | Status: DC

## 2014-09-17 ENCOUNTER — Telehealth: Payer: Self-pay

## 2014-09-17 ENCOUNTER — Other Ambulatory Visit: Payer: Self-pay

## 2014-09-17 MED ORDER — ALLOPURINOL 100 MG PO TABS: 100.0000 mg | ORAL_TABLET | Freq: Every day | ORAL | Status: DC

## 2014-09-17 NOTE — Telephone Encounter (Signed)
Pt called stating on 09/08/2014 when she began her antibiotic she started having severe diarrhea and nausea. Pt stated that she was only able to complete 5 days worth of her medication. Pt is scheduled to have a tooth pulled tomorrow. Wanted to make sure it was ok to proceed with having her tooth pulled. Please advise, Thanks!

## 2014-09-17 NOTE — Telephone Encounter (Signed)
No problem.

## 2014-10-06 ENCOUNTER — Encounter: Payer: Self-pay | Admitting: Endocrinology

## 2014-10-06 ENCOUNTER — Ambulatory Visit (INDEPENDENT_AMBULATORY_CARE_PROVIDER_SITE_OTHER): Payer: Medicare Other | Admitting: Endocrinology

## 2014-10-06 VITALS — BP 134/88 | HR 66 | Temp 98.1°F | Ht 64.75 in | Wt 206.0 lb

## 2014-10-06 DIAGNOSIS — Z0000 Encounter for general adult medical examination without abnormal findings: Secondary | ICD-10-CM

## 2014-10-06 DIAGNOSIS — H6123 Impacted cerumen, bilateral: Secondary | ICD-10-CM

## 2014-10-06 MED ORDER — SITAGLIPTIN PHOSPHATE 100 MG PO TABS: 100.0000 mg | ORAL_TABLET | Freq: Every day | ORAL | Status: DC

## 2014-10-06 MED ORDER — FUROSEMIDE 20 MG PO TABS: ORAL_TABLET | ORAL | Status: DC

## 2014-10-06 MED ORDER — ALLOPURINOL 100 MG PO TABS: 100.0000 mg | ORAL_TABLET | Freq: Every day | ORAL | Status: DC

## 2014-10-06 NOTE — Progress Notes (Signed)
Subjective:    Patient ID: Destiny Flowers, female    DOB: 03/20/1929, 80 y.o.   MRN: [id number]  HPI Pt is here for regular wellness examination, and is feeling pretty well in general, and says chronic med probs are stable, except as noted below.  Past Medical History  Diagnosis Date  . DIABETES MELLITUS, TYPE II 06/06/2007  . GERD 06/06/2007  . HYPERCHOLESTEROLEMIA 02/19/2008  . HYPERTENSION 06/06/2007  . HYPOTHYROIDISM 06/06/2007  . OSTEOPOROSIS 06/06/2007  . RENAL INSUFFICIENCY 06/06/2007  . Dysthymic disorder   . Homocysteinemia   . ALLERGIC RHINITIS   . OSTEOARTHRITIS   . INSOMNIA   . DVT (deep venous thrombosis)     Left leg  . OBSTRUCTIVE SLEEP APNEA     No past surgical history on file.  History   Social History  . Marital Status: Single    Spouse Name: N/A    Number of Children: N/A  . Years of Education: N/A   Occupational History  . Not on file.   Social History Main Topics  . Smoking status: Passive Smoke Exposure - Never Smoker  . Smokeless tobacco: Not on file  . Alcohol Use: No  . Drug Use: No  . Sexual Activity: Not on file   Other Topics Concern  . Not on file   Social History Narrative    Current Outpatient Prescriptions on File Prior to Visit  Medication Sig Dispense Refill  . aspirin 81 MG tablet Take 81 mg by mouth daily.      . Calcium Carbonate-Vitamin D (CALTRATE 600+D) 600-400 MG-UNIT per tablet Take 1 tablet by mouth 2 (two) times daily.      Blossom Hoops. Carbonyl Iron (PERFECT IRON) 25 MG TABS Take by mouth as needed.      . Cholecalciferol (VITAMIN D3) 1000 UNITS CAPS Take by mouth daily.      . clotrimazole-betamethasone (LOTRISONE) cream 3x a day, as needed for rash 45 g 5  . colchicine 0.6 MG tablet Take 0.6 mg by mouth as needed. For gout     . folic acid (FOLVITE) 400 MCG tablet Take 800 mcg by mouth daily.      Marland Kitchen. levothyroxine (SYNTHROID, LEVOTHROID) 100 MCG tablet Take 1 tablet (100 mcg total) by mouth daily before breakfast. 90 tablet 3    . lisinopril (PRINIVIL,ZESTRIL) 40 MG tablet TAKE ONE TABLET BY MOUTH ONCE DAILY 90 tablet 0  . metFORMIN (GLUCOPHAGE-XR) 500 MG 24 hr tablet TAKE FOUR TABLETS BY MOUTH ONCE DAILY IN THE MORNING 360 tablet 0  . Multiple Vitamin (MULTIVITAMIN) tablet Take 1 tablet by mouth daily.      Marland Kitchen. omeprazole (PRILOSEC OTC) 20 MG tablet Take 20 mg by mouth daily.    . traMADol-acetaminophen (ULTRACET) 37.5-325 MG per tablet Take 1 tablet by mouth as needed. For pain      . traZODone (DESYREL) 100 MG tablet TAKE ONE TABLET BY MOUTH AT BEDTIME 30 tablet 1   No current facility-administered medications on file prior to visit.    Allergies  Allergen Reactions  . Penicillins     Family History  Problem Relation Age of Onset  . Stroke Mother   . Coronary artery disease Father     BP 134/88 mmHg  Pulse 66  Temp(Src) 98.1 F (36.7 C) (Oral)  Ht 5' 4.75" (1.645 m)  Wt 206 lb (93.441 kg)  BMI 34.53 kg/m2  SpO2 95%  Review of Systems  Constitutional: Negative for fever.  Pt reports weight gain  HENT: Negative for sore throat.   Eyes: Negative for visual disturbance.  Respiratory: Negative for shortness of breath.   Cardiovascular: Negative for chest pain.  Gastrointestinal: Negative for anal bleeding.  Endocrine: Negative for cold intolerance.  Genitourinary: Negative for hematuria.  Musculoskeletal: Negative for gait problem.  Skin: Negative for rash.  Allergic/Immunologic: Negative for environmental allergies.  Neurological: Negative for syncope.  Hematological: Does not bruise/bleed easily.  Psychiatric/Behavioral: Negative for dysphoric mood.       Objective:   Physical Exam VS: see vs page GEN: no distress HEAD: head: no deformity eyes: no periorbital swelling, no proptosis external nose and ears are normal mouth: no lesion seen NECK: supple, thyroid is not enlarged CHEST WALL: no deformity LUNGS:  Clear to auscultation BREASTS:  No mass.  No d/c CV: reg rate and  rhythm, no murmur ABD: abdomen is soft, nontender.  no hepatosplenomegaly.  not distended.  no hernia GENITALIA:  Normal external female.  Normal bimanual exam RECTAL: normal external and internal exam.  heme neg MUSCULOSKELETAL: muscle bulk and strength are grossly normal.  no obvious joint swelling.  gait is normal and steady EXTEMITIES: no deformity.  no ulcer on the feet.  feet are of normal color and temp.  no edema PULSES: dorsalis pedis intact bilat.  no carotid bruit NEURO:  cn 2-12 grossly intact.   readily moves all 4's.  sensation is intact to touch on the feet SKIN:  Normal texture and temperature.  No rash or suspicious lesion is visible.   NODES:  None palpable at the neck PSYCH: alert, well-oriented.  Does not appear anxious nor depressed.        Assessment & Plan:  Wellness visit today, with problems stable, except as noted.    SEPARATE EVALUATION FOLLOWS--EACH PROBLEM HERE IS NEW, NOT RESPONDING TO TREATMENT, OR POSES SIGNIFICANT RISK TO THE PATIENT'S HEALTH: HISTORY OF THE PRESENT ILLNESS: Pt states few years of moderate hearing loss from both ears, but no assoc pain.   PAST MEDICAL HISTORY reviewed and up to date today REVIEW OF SYSTEMS: Denies headache PHYSICAL EXAMINATION: VITAL SIGNS:  See vs page GENERAL: no distress Both eac's are occluded with cerumen. After irrigation, repeat exam shows normal EAC's and TM's IMPRESSION: Cerumen impaction Hearing loss, possibly due to this PLAN:  Call if hearing loss persists

## 2014-10-06 NOTE — Progress Notes (Signed)
we discussed code status.  pt requests full code, but would not want to be started or maintained on artificial life-support measures if there was not a reasonable chance of recovery 

## 2014-10-06 NOTE — Patient Instructions (Addendum)
please consider these measures for your health:  minimize alcohol.  do not use tobacco products.  have a colonoscopy at least every 10 years from age 78.  Women should have an annual mammogram from age 78.  keep firearms safely stored.  always use seat belts.  have working smoke alarms in your home.  see an eye doctor and dentist regularly.  never drive under the influence of alcohol or drugs (including prescription drugs).  those with fair skin should take precautions against the sun. please let me know what your wishes would be, if artificial life support measures should become necessary.  it is critically important to prevent falling down (keep floor areas well-lit, dry, and free of loose objects.  If you have a cane, walker, or wheelchair, you should use it, even for short trips around the house.  Also, try not to rush) Please come back for a follow-up appointment in 6 months.   Please call if your hearing loss persists.

## 2014-10-08 ENCOUNTER — Other Ambulatory Visit: Payer: Self-pay | Admitting: Endocrinology

## 2014-10-17 ENCOUNTER — Ambulatory Visit (INDEPENDENT_AMBULATORY_CARE_PROVIDER_SITE_OTHER): Payer: Medicare Other | Admitting: Endocrinology

## 2014-10-17 ENCOUNTER — Ambulatory Visit
Admission: RE | Admit: 2014-10-17 | Discharge: 2014-10-17 | Disposition: A | Discharge status: Home / Self Care | Payer: Medicare Other | Source: Ambulatory Visit | Attending: Endocrinology | Admitting: Endocrinology

## 2014-10-17 ENCOUNTER — Encounter: Payer: Self-pay | Admitting: Endocrinology

## 2014-10-17 VITALS — BP 134/78 | HR 78 | Temp 98.0°F | Ht 64.75 in | Wt 207.0 lb

## 2014-10-17 DIAGNOSIS — R05 Cough: Secondary | ICD-10-CM

## 2014-10-17 DIAGNOSIS — R059 Cough, unspecified: Secondary | ICD-10-CM

## 2014-10-17 MED ORDER — TRAMADOL-ACETAMINOPHEN 37.5-325 MG PO TABS: 1.0000 | ORAL_TABLET | ORAL | Status: DC | PRN

## 2014-10-17 MED ORDER — AZITHROMYCIN 500 MG PO TABS: 500.0000 mg | ORAL_TABLET | Freq: Every day | ORAL | Status: DC

## 2014-10-17 NOTE — Patient Instructions (Addendum)
i have sent a prescription to your pharmacy, for an antibiotic pill. A chest-x-ray is requested for you today.  We'll let you know about the results. Loratadine-d (non-prescription) will help your congestion.   I hope you feel better soon.  If you don't feel better by next week, please call back.  Please call sooner if you get worse.

## 2014-10-17 NOTE — Progress Notes (Signed)
Subjective:    Patient ID: Destiny Flowers, female    DOB: 05/12/1929, 80 y.o.   MRN: [id number]  HPI Pt states few days of moderate pain at the throat, and assoc myalgias.  She also has a slight dry cough. Past Medical History  Diagnosis Date  . DIABETES MELLITUS, TYPE II 06/06/2007  . GERD 06/06/2007  . HYPERCHOLESTEROLEMIA 02/19/2008  . HYPERTENSION 06/06/2007  . HYPOTHYROIDISM 06/06/2007  . OSTEOPOROSIS 06/06/2007  . RENAL INSUFFICIENCY 06/06/2007  . Dysthymic disorder   . Homocysteinemia   . ALLERGIC RHINITIS   . OSTEOARTHRITIS   . INSOMNIA   . DVT (deep venous thrombosis)     Left leg  . OBSTRUCTIVE SLEEP APNEA     No past surgical history on file.  History   Social History  . Marital Status: Single    Spouse Name: N/A    Number of Children: N/A  . Years of Education: N/A   Occupational History  . Not on file.   Social History Main Topics  . Smoking status: Passive Smoke Exposure - Never Smoker  . Smokeless tobacco: Not on file  . Alcohol Use: No  . Drug Use: No  . Sexual Activity: Not on file   Other Topics Concern  . Not on file   Social History Narrative    Current Outpatient Prescriptions on File Prior to Visit  Medication Sig Dispense Refill  . allopurinol (ZYLOPRIM) 100 MG tablet Take 1 tablet (100 mg total) by mouth daily. 90 tablet 3  . aspirin 81 MG tablet Take 81 mg by mouth daily.      . Calcium Carbonate-Vitamin D (CALTRATE 600+D) 600-400 MG-UNIT per tablet Take 1 tablet by mouth 2 (two) times daily.      Blossom Hoops. Carbonyl Iron (PERFECT IRON) 25 MG TABS Take by mouth as needed.      . Cholecalciferol (VITAMIN D3) 1000 UNITS CAPS Take by mouth daily.      . clotrimazole-betamethasone (LOTRISONE) cream 3x a day, as needed for rash 45 g 5  . colchicine 0.6 MG tablet Take 0.6 mg by mouth as needed. For gout     . folic acid (FOLVITE) 400 MCG tablet Take 800 mcg by mouth daily.      . furosemide (LASIX) 20 MG tablet 1/2 tab daily 90 tablet 1  .  levothyroxine (SYNTHROID, LEVOTHROID) 100 MCG tablet Take 1 tablet (100 mcg total) by mouth daily before breakfast. 90 tablet 3  . lisinopril (PRINIVIL,ZESTRIL) 40 MG tablet TAKE ONE TABLET BY MOUTH ONCE DAILY 90 tablet 0  . metFORMIN (GLUCOPHAGE-XR) 500 MG 24 hr tablet TAKE FOUR TABLETS BY MOUTH ONCE DAILY IN THE MORNING 360 tablet 0  . Multiple Vitamin (MULTIVITAMIN) tablet Take 1 tablet by mouth daily.      Marland Kitchen. omeprazole (PRILOSEC OTC) 20 MG tablet Take 20 mg by mouth daily.    . sitaGLIPtin (JANUVIA) 100 MG tablet Take 1 tablet (100 mg total) by mouth daily. 90 tablet 3  . traZODone (DESYREL) 100 MG tablet TAKE ONE TABLET BY MOUTH AT BEDTIME 30 tablet 1   No current facility-administered medications on file prior to visit.    Allergies  Allergen Reactions  . Penicillins     Family History  Problem Relation Age of Onset  . Stroke Mother   . Coronary artery disease Father     BP 134/78 mmHg  Pulse 78  Temp(Src) 98 F (36.7 C) (Oral)  Ht 5' 4.75" (1.645 m)  Wt 207 lb (  93.895 kg)  BMI 34.70 kg/m2  SpO2 93%    Review of Systems Denies fever and earache    Objective:   Physical Exam VITAL SIGNS:  See vs page GENERAL: no distress head: no deformity eyes: no periorbital swelling, no proptosis external nose and ears are normal mouth: no lesion seen Both eac's and tm's are normal. LUNGS:  Clear to auscultation, except for a few rales at the left base.     CXR: NAD     Assessment & Plan:  URI, new   Patient is advised the following: Patient Instructions  i have sent a prescription to your pharmacy, for an antibiotic pill. A chest-x-ray is requested for you today.  We'll let you know about the results. Loratadine-d (non-prescription) will help your congestion.   I hope you feel better soon.  If you don't feel better by next week, please call back.  Please call sooner if you get worse.

## 2014-10-23 ENCOUNTER — Telehealth: Payer: Self-pay | Admitting: Endocrinology

## 2014-10-23 NOTE — Telephone Encounter (Signed)
At this point, i don't think either antibiotics or tamiflu would help.  Please advise ov tomorrrow.  We need to go over this more.

## 2014-10-23 NOTE — Telephone Encounter (Signed)
Patient ask you to give her a call °

## 2014-10-23 NOTE — Telephone Encounter (Signed)
Pt called stating that she is not felling any better. Pt states that she has been experiencing muscle aches and a sore throat for about two days. Pt wanted to know if she could be prescribed tamiflu. She does not want anymore antibiotics.  Please advise, Thanks!

## 2014-10-24 NOTE — Telephone Encounter (Signed)
Contacted pt. She states that she has been taking over the counter medication and is feeling better and declines office visit at this time. Advised pt if she begins to feel worse to give us a call and we can set her up for an appointment.

## 2014-10-31 ENCOUNTER — Other Ambulatory Visit: Payer: Self-pay | Admitting: Endocrinology

## 2014-11-27 ENCOUNTER — Ambulatory Visit (INDEPENDENT_AMBULATORY_CARE_PROVIDER_SITE_OTHER): Payer: Medicare HMO | Admitting: Endocrinology

## 2014-11-27 ENCOUNTER — Encounter: Payer: Self-pay | Admitting: Endocrinology

## 2014-11-27 VITALS — BP 128/74 | HR 76 | Temp 97.9°F | Ht 64.75 in | Wt 203.0 lb

## 2014-11-27 DIAGNOSIS — N189 Chronic kidney disease, unspecified: Secondary | ICD-10-CM

## 2014-11-27 DIAGNOSIS — M1A9XX0 Chronic gout, unspecified, without tophus (tophi): Secondary | ICD-10-CM

## 2014-11-27 DIAGNOSIS — R58 Hemorrhage, not elsewhere classified: Secondary | ICD-10-CM

## 2014-11-27 DIAGNOSIS — E1122 Type 2 diabetes mellitus with diabetic chronic kidney disease: Secondary | ICD-10-CM

## 2014-11-27 LAB — BASIC METABOLIC PANEL
BUN: 28 mg/dL — AB (ref 6–23)
CHLORIDE: 99 meq/L (ref 96–112)
CO2: 33 meq/L — AB (ref 19–32)
Calcium: 10 mg/dL (ref 8.4–10.5)
Creatinine, Ser: 1.14 mg/dL (ref 0.40–1.20)
GFR: 48.31 mL/min — ABNORMAL LOW (ref >60.00)
Glucose, Bld: 80 mg/dL (ref 70–99)
POTASSIUM: 4.2 meq/L (ref 3.5–5.1)
Sodium: 138 mEq/L (ref 135–145)

## 2014-11-27 LAB — CBC WITH DIFFERENTIAL/PLATELET
BASOS PCT: 0.4 % (ref 0.0–3.0)
Basophils Absolute: 0 10*3/uL (ref 0.0–0.1)
EOS PCT: 1.6 % (ref 0.0–5.0)
Eosinophils Absolute: 0.1 10*3/uL (ref 0.0–0.7)
HCT: 40.3 % (ref 36.0–46.0)
Hemoglobin: 13.1 g/dL (ref 12.0–15.0)
LYMPHS PCT: 20.1 % (ref 12.0–46.0)
Lymphs Abs: 1.4 10*3/uL (ref 0.7–4.0)
MCHC: 32.6 g/dL (ref 30.0–36.0)
MCV: 89.1 fl (ref 78.0–100.0)
MONO ABS: 0.7 10*3/uL (ref 0.1–1.0)
Monocytes Relative: 9.8 % (ref 3.0–12.0)
NEUTROS PCT: 68.1 % (ref 43.0–77.0)
Neutro Abs: 4.6 10*3/uL (ref 1.4–7.7)
PLATELETS: 248 10*3/uL (ref 150.0–400.0)
RBC: 4.52 Mil/uL (ref 3.87–5.11)
RDW: 14.3 % (ref 11.5–15.5)
WBC: 6.8 10*3/uL (ref 4.0–10.5)

## 2014-11-27 LAB — HEMOGLOBIN A1C: Hgb A1c MFr Bld: 6.6 % — ABNORMAL HIGH (ref 4.6–6.5)

## 2014-11-27 LAB — URIC ACID: Uric Acid, Serum: 5.8 mg/dL (ref 2.4–7.0)

## 2014-11-27 MED ORDER — CLOTRIMAZOLE-BETAMETHASONE 1-0.05 % EX CREA: TOPICAL_CREAM | CUTANEOUS | Status: DC

## 2014-11-27 NOTE — Patient Instructions (Signed)
blood tests are being requested for you today.  We'll let you know about the results.   Please stop taking the [place].  Please come back for a follow-up appointment in 3 months.  check your blood sugar once a day.  vary the time of day when you check, between before the 3 meals, and at bedtime.  also check if you have symptoms of your blood sugar being too high or too low.  please keep a record of the readings and bring it to your next appointment here.  You can write it on any piece of paper.  please call us sooner if your blood sugar goes below 70, or if you have a lot of readings over 200.

## 2014-11-27 NOTE — Progress Notes (Signed)
Subjective:    Patient ID: Destiny Flowers, female    DOB: 12/26/1928, 81 y.o.   MRN: [id number]  HPI Pt returns for f/u of diabetes mellitus: DM type: 2 Dx'ed: 1997 Complications: none Therapy: 2 oral meds GDM: G0 DKA: never Severe hypoglycemia: never Pancreatitis: never Interval history: pt says Alma Friendlyjanuvia is causing her to fall.  She has bruise on the right thigh, and slight assoc pain.   She is back on allopurinol. Past Medical History  Diagnosis Date  . DIABETES MELLITUS, TYPE II 06/06/2007  . GERD 06/06/2007  . HYPERCHOLESTEROLEMIA 02/19/2008  . HYPERTENSION 06/06/2007  . HYPOTHYROIDISM 06/06/2007  . OSTEOPOROSIS 06/06/2007  . RENAL INSUFFICIENCY 06/06/2007  . Dysthymic disorder   . Homocysteinemia   . ALLERGIC RHINITIS   . OSTEOARTHRITIS   . INSOMNIA   . DVT (deep venous thrombosis)     Left leg  . OBSTRUCTIVE SLEEP APNEA     No past surgical history on file.  History   Social History  . Marital Status: Single    Spouse Name: N/A    Number of Children: N/A  . Years of Education: N/A   Occupational History  . Not on file.   Social History Main Topics  . Smoking status: Passive Smoke Exposure - Never Smoker  . Smokeless tobacco: Not on file  . Alcohol Use: No  . Drug Use: No  . Sexual Activity: Not on file   Other Topics Concern  . Not on file   Social History Narrative    Current Outpatient Prescriptions on File Prior to Visit  Medication Sig Dispense Refill  . allopurinol (ZYLOPRIM) 100 MG tablet Take 1 tablet (100 mg total) by mouth daily. 90 tablet 3  . aspirin 81 MG tablet Take 81 mg by mouth daily.      Marland Kitchen. azithromycin (ZITHROMAX) 500 MG tablet Take 1 tablet (500 mg total) by mouth daily. 5 tablet 0  . Calcium Carbonate-Vitamin D (CALTRATE 600+D) 600-400 MG-UNIT per tablet Take 1 tablet by mouth 2 (two) times daily.      Blossom Hoops. Carbonyl Iron (PERFECT IRON) 25 MG TABS Take by mouth as needed.      . Cholecalciferol (VITAMIN D3) 1000 UNITS CAPS Take by  mouth daily.      . colchicine 0.6 MG tablet Take 0.6 mg by mouth as needed. For gout     . folic acid (FOLVITE) 400 MCG tablet Take 800 mcg by mouth daily.      . furosemide (LASIX) 20 MG tablet 1/2 tab daily 90 tablet 1  . levothyroxine (SYNTHROID, LEVOTHROID) 100 MCG tablet Take 1 tablet (100 mcg total) by mouth daily before breakfast. 90 tablet 3  . lisinopril (PRINIVIL,ZESTRIL) 40 MG tablet TAKE ONE TABLET BY MOUTH ONCE DAILY 90 tablet 0  . metFORMIN (GLUCOPHAGE-XR) 500 MG 24 hr tablet TAKE FOUR TABLETS BY MOUTH ONCE DAILY IN THE MORNING 360 tablet 0  . Multiple Vitamin (MULTIVITAMIN) tablet Take 1 tablet by mouth daily.      Marland Kitchen. omeprazole (PRILOSEC OTC) 20 MG tablet Take 20 mg by mouth daily.    . traMADol-acetaminophen (ULTRACET) 37.5-325 MG per tablet Take 1 tablet by mouth as needed. For pain 30 tablet 2  . traZODone (DESYREL) 100 MG tablet TAKE ONE TABLET BY MOUTH AT BEDTIME NEEDS  APPONTMENT  FOR  ADDITIONAL  REFILLS 30 tablet 0   No current facility-administered medications on file prior to visit.    Allergies  Allergen Reactions  . Januvia [Sitagliptin]  falls  . Penicillins     Family History  Problem Relation Age of Onset  . Stroke Mother   . Coronary artery disease Father     BP 128/74 mmHg  Pulse 76  Temp(Src) 97.9 F (36.6 C) (Oral)  Ht 5' 4.75" (1.645 m)  Wt 203 lb (92.08 kg)  BMI 34.03 kg/m2  SpO2 95%       Review of Systems She denies hypoglycemia and weight change.  Rash on the skin folds of the abd has recurred.    Objective:   Physical Exam VITAL SIGNS:  See vs page GENERAL: no distress Pulses: dorsalis pedis intact bilat.   MSK: no deformity of the feet CV: 1+ bilat leg edema Skin:  no ulcer on the feet.  normal color and temp on the feet. Neuro: sensation is intact to touch on the feet Ext; large ecchymosis on the right posterior thigh.     Lab Results  Component Value Date   HGBA1C 6.6* 11/27/2014   Lab Results  Component  Value Date   CREATININE 1.14 11/27/2014   BUN 28* 11/27/2014   NA 138 11/27/2014   K 4.2 11/27/2014   CL 99 11/27/2014   CO2 33* 11/27/2014   Lab Results  Component Value Date   WBC 6.8 11/27/2014   HGB 13.1 11/27/2014   HCT 40.3 11/27/2014   MCV 89.1 11/27/2014   PLT 248.0 11/27/2014   Uric acid=normal     Assessment & Plan:  DM: well-controlled Ecchymosis, due to fall, new.  This is unlikely due to [place] Hyperuricemia, well-controlled   Patient is advised the following: Patient Instructions  blood tests are being requested for you today.  We'll let you know about the results.   Please stop taking the [place].  Please come back for a follow-up appointment in 3 months.  check your blood sugar once a day.  vary the time of day when you check, between before the 3 meals, and at bedtime.  also check if you have symptoms of your blood sugar being too high or too low.  please keep a record of the readings and bring it to your next appointment here.  You can write it on any piece of paper.  please call us sooner if your blood sugar goes below 70, or if you have a lot of readings over 200.

## 2015-01-06 ENCOUNTER — Other Ambulatory Visit: Payer: Self-pay | Admitting: Endocrinology

## 2015-01-30 ENCOUNTER — Other Ambulatory Visit: Payer: Self-pay | Admitting: Endocrinology

## 2015-02-12 LAB — HM DIABETES EYE EXAM

## 2015-02-18 ENCOUNTER — Ambulatory Visit (INDEPENDENT_AMBULATORY_CARE_PROVIDER_SITE_OTHER): Payer: Medicare HMO | Admitting: Internal Medicine

## 2015-02-18 ENCOUNTER — Encounter: Payer: Self-pay | Admitting: Internal Medicine

## 2015-02-18 VITALS — BP 138/90 | HR 79 | Ht 64.75 in | Wt 203.0 lb

## 2015-02-18 DIAGNOSIS — G4733 Obstructive sleep apnea (adult) (pediatric): Secondary | ICD-10-CM

## 2015-02-18 DIAGNOSIS — J440 Chronic obstructive pulmonary disease with acute lower respiratory infection: Secondary | ICD-10-CM | POA: Diagnosis not present

## 2015-02-18 NOTE — Patient Instructions (Signed)
Ok to stop CPAP  Ok to try one of the drug store mouth pieces for snoring  Please call as needed

## 2015-02-18 NOTE — Progress Notes (Signed)
Patient ID: Destiny Flowers, female    DOB: 01/02/1929, 81 y.o.   MRN: [id number]  HPI 07/14/11- 81 year old female with history of passive smoking, followed for obstructive sleep apnea, allergic rhinitis, complicated by hypothyroid, DM, HBP, renal insufficiency Last here January 07, 2011. CPAP 11- wakes and takes it off in sleep, feeling somebody has "got" her. Discussed going to the [facility] branch in [place] so that she can discuss mask fitting and machine issues directly with her home care company.. She feels confined by the mask,not smothered by the pressure..  Left leg- the side that had remote DVT- has noted a bit of painless swelling on dorsum of left foot.  Coughs a little clear mucus in the morning. Can't walk easily and asks for handicaped parking.   11/11/11- 81 year old female with history of passive smoking, followed for obstructive sleep apnea, allergic rhinitis, complicated by hypothyroid, DM, HBP, renal insufficiency. Friend here. Acute visit-haorseness, cough-yellow in color; increased SOB since Christmas eve; fever and chills. 4 days blowing and cough. Persistent nasal congestion. Denies sore throat, fever, GI upset. Nothing purulent or painful.  02/13/13- 81 year old female with history of passive smoking, followed for obstructive sleep apnea, allergic rhinitis, complicated by hypothyroid, DM, HBP, renal insufficiency.  FOLLOWS WUJ:WJXB seen 2012; cough-productive and deep-yellow in color; SOB and wheezing as well. Acute visit- 4 days increased cough, nasal congestion, rhinorhea, yellow. No fever, sore throat.  Blames pollen. Also aware of water problem under house- being worked on.  Baseline bothersome dry cough- mild. We discussed lisinopril/ ACEI  taken for renal preservation. CXR 11/24/11 IMPRESSION:  1. Airway thickening may reflect bronchitis or reactive airways  disease. No airspace opacity is identified to suggest bacterial  pneumonia pattern.  2. Moderate  sized hiatal hernia.  Original Report Authenticated By: Dellia Cloud, M.D.  02/13/14- 81 year old female with history of passive smoking, followed for obstructive sleep apnea, allergic rhinitis, complicated by hypothyroid, DM, HBP, renal insufficiency.  FOLLOWS FOR:  Still having cough with light yellow mucus   Persistent cough. Z-Pak then doxycycline did not help. Sputum remains yellow. History of penicillin allergy with swelling. CXR 02/14/13 IMPRESSION:  No acute cardiopulmonary abnormality.  Original Report Authenticated By: Janeece Riggers, M.D.  02/18/15- 81 year old female with history of passive smoking, followed for obstructive sleep apnea, allergic rhinitis, complicated by Hiatal Hernia, hypothyroid, DM, HBP, renal insufficiency.  Follows for: prod cough w/beige mucus; denies wheezing. We discussed history of sleep apnea. She is not using CPAP-she would either wake up scared with it on, or find that she had pulled it off in her sleep. She wants to try otc mouth piece for snoring. CXR 10/17/14-  IMPRESSION: No active disease. Stable large hiatal hernia. Electronically Signed  By: Irish Lack M.D.  On: 10/17/2014 14:44  Review of Systems-see HPI Constitutional:  No-weight loss,  No-night sweats, fevers, chills, fatigue, lassitude. HEENT:   No-  headaches, difficulty swallowing, tooth/dental problems, sore throat,         sneezing, itching, ear ache, nasal congestion, post nasal drip,  CV:  No-   chest pain, orthopnea, PND, swelling in lower extremities, anasarca, dizziness, palpitations Resp: Some acute  shortness of breath with exertion or at rest.              + productive cough,  +non-productive cough,  No-  coughing up of blood.              + change in color of mucus.  No- wheezing.  Skin: No-   rash or lesions. GI:  No-   heartburn, indigestion, abdominal pain, nausea, vomiting, GU:  MS:  No-   joint pain,  swelling.  . Neuro- nothing unusual Psych:  No- change  in mood or affect. No depression or anxiety.  No memory loss.  Objective:   Physical Exam  General- Alert, Oriented, Affect-appropriate, Distress- none acute     +Talkative, elderly Skin- rash-none, lesions- none, excoriation- none Lymphadenopathy- none Head- atraumatic            Eyes- Gross vision intact, PERRLA, conjunctivae clear secretions            Ears- Hearing, canals normal            Nose- +sniffing, No- Septal dev, mucus, polyps, erosion, perforation             Throat- Mallampati II , mucosa clear , drainage- none, tonsils- atrophic Neck- flexible , trachea midline, no stridor , thyroid nl, carotid no bruit Chest - symmetrical excursion , unlabored           Heart/CV- RRR , no murmur , no gallop  , no rub, nl s1 s2                           - JVD- none , +edema- trace, dorsum left foot, stasis changes- none, varices- none           Lung-wheeze+ RU, cough-none , dullness-none, rub- none.            Chest wall-  Abd- +borborygmi Br/ Gen/ Rectal- Not done, not indicated Extrem- cyanosis- none, clubbing, none, atrophy- none, strength- nl     Neuro- grossly intact to observation

## 2015-02-26 ENCOUNTER — Encounter: Payer: Self-pay | Admitting: Endocrinology

## 2015-02-27 ENCOUNTER — Ambulatory Visit (INDEPENDENT_AMBULATORY_CARE_PROVIDER_SITE_OTHER): Payer: Medicare HMO | Admitting: Endocrinology

## 2015-02-27 ENCOUNTER — Encounter: Payer: Self-pay | Admitting: Endocrinology

## 2015-02-27 VITALS — BP 132/80 | HR 73 | Temp 97.5°F | Ht 64.75 in | Wt 202.0 lb

## 2015-02-27 DIAGNOSIS — D500 Iron deficiency anemia secondary to blood loss (chronic): Secondary | ICD-10-CM | POA: Diagnosis not present

## 2015-02-27 DIAGNOSIS — E1122 Type 2 diabetes mellitus with diabetic chronic kidney disease: Secondary | ICD-10-CM | POA: Diagnosis not present

## 2015-02-27 DIAGNOSIS — N259 Disorder resulting from impaired renal tubular function, unspecified: Secondary | ICD-10-CM | POA: Diagnosis not present

## 2015-02-27 DIAGNOSIS — N189 Chronic kidney disease, unspecified: Secondary | ICD-10-CM | POA: Diagnosis not present

## 2015-02-27 LAB — BASIC METABOLIC PANEL
BUN: 20 mg/dL (ref 6–23)
CALCIUM: 9.6 mg/dL (ref 8.4–10.5)
CO2: 28 meq/L (ref 19–32)
Chloride: 102 mEq/L (ref 96–112)
Creatinine, Ser: 0.84 mg/dL (ref 0.40–1.20)
GFR: 68.68 mL/min (ref >60.00)
Glucose, Bld: 121 mg/dL — ABNORMAL HIGH (ref 70–99)
Potassium: 4.1 mEq/L (ref 3.5–5.1)
SODIUM: 136 meq/L (ref 135–145)

## 2015-02-27 LAB — CBC WITH DIFFERENTIAL/PLATELET
Basophils Absolute: 0.1 10*3/uL (ref 0.0–0.1)
Basophils Relative: 0.7 % (ref 0.0–3.0)
EOS ABS: 0.1 10*3/uL (ref 0.0–0.7)
Eosinophils Relative: 1.9 % (ref 0.0–5.0)
HCT: 37 % (ref 36.0–46.0)
Hemoglobin: 12.4 g/dL (ref 12.0–15.0)
Lymphocytes Relative: 19.6 % (ref 12.0–46.0)
Lymphs Abs: 1.5 10*3/uL (ref 0.7–4.0)
MCHC: 33.7 g/dL (ref 30.0–36.0)
MCV: 86.4 fl (ref 78.0–100.0)
MONO ABS: 0.7 10*3/uL (ref 0.1–1.0)
Monocytes Relative: 9.1 % (ref 3.0–12.0)
NEUTROS PCT: 68.7 % (ref 43.0–77.0)
Neutro Abs: 5.3 10*3/uL (ref 1.4–7.7)
PLATELETS: 232 10*3/uL (ref 150.0–400.0)
RBC: 4.28 Mil/uL (ref 3.87–5.11)
RDW: 13.7 % (ref 11.5–15.5)
WBC: 7.7 10*3/uL (ref 4.0–10.5)

## 2015-02-27 LAB — IBC PANEL
Iron: 38 ug/dL — ABNORMAL LOW (ref 42–145)
Saturation Ratios: 7.8 % — ABNORMAL LOW (ref 20.0–50.0)
Transferrin: 350 mg/dL (ref 212.0–360.0)

## 2015-02-27 LAB — HEMOGLOBIN A1C: Hgb A1c MFr Bld: 6.9 % — ABNORMAL HIGH (ref 4.6–6.5)

## 2015-02-27 NOTE — Progress Notes (Signed)
Subjective:    Patient ID: Destiny Flowers, female    DOB: 08/13/1929, 81 y.o.   MRN: [id number]  HPI Pt returns for f/u of diabetes mellitus: DM type: 2 Dx'ed: 1997 Complications: none Therapy: 2 oral meds GDM: G0 DKA: never Severe hypoglycemia: never Pancreatitis: never Other: pt stopped [place]uvia, as she said it was causing her to fall Interval history: no cbg record, but states cbg's are approx 100.  She denies hypoglycemia.   HTN: she has edema Anemia: she denies brbpr Pt states decreased hearing from both ears, but no assoc pain. Past Medical History  Diagnosis Date  . DIABETES MELLITUS, TYPE II 06/06/2007  . GERD 06/06/2007  . HYPERCHOLESTEROLEMIA 02/19/2008  . HYPERTENSION 06/06/2007  . HYPOTHYROIDISM 06/06/2007  . OSTEOPOROSIS 06/06/2007  . RENAL INSUFFICIENCY 06/06/2007  . Dysthymic disorder   . Homocysteinemia   . ALLERGIC RHINITIS   . OSTEOARTHRITIS   . INSOMNIA   . DVT (deep venous thrombosis)     Left leg  . OBSTRUCTIVE SLEEP APNEA     No past surgical history on file.  History   Social History  . Marital Status: Single    Spouse Name: N/A  . Number of Children: N/A  . Years of Education: N/A   Occupational History  . Not on file.   Social History Main Topics  . Smoking status: Passive Smoke Exposure - Never Smoker  . Smokeless tobacco: Not on file  . Alcohol Use: No  . Drug Use: No  . Sexual Activity: Not on file   Other Topics Concern  . Not on file   Social History Narrative    Current Outpatient Prescriptions on File Prior to Visit  Medication Sig Dispense Refill  . allopurinol (ZYLOPRIM) 100 MG tablet Take 1 tablet (100 mg total) by mouth daily. 90 tablet 3  . aspirin 81 MG tablet Take 81 mg by mouth daily.      Blossom Hoops. Carbonyl Iron (PERFECT IRON) 25 MG TABS Take by mouth as needed.      . Cholecalciferol (VITAMIN D3) 1000 UNITS CAPS Take by mouth daily.      . clotrimazole-betamethasone (LOTRISONE) cream 3x a day, as needed for rash 45 g 5    . folic acid (FOLVITE) 400 MCG tablet Take 800 mcg by mouth daily.      . furosemide (LASIX) 20 MG tablet 1/2 tab daily 90 tablet 1  . levothyroxine (SYNTHROID, LEVOTHROID) 100 MCG tablet Take 1 tablet (100 mcg total) by mouth daily before breakfast. 90 tablet 3  . lisinopril (PRINIVIL,ZESTRIL) 40 MG tablet TAKE ONE TABLET BY MOUTH ONCE DAILY 90 tablet 0  . metFORMIN (GLUCOPHAGE-XR) 500 MG 24 hr tablet TAKE FOUR TABLETS BY MOUTH ONCE DAILY IN THE MORNING 360 tablet 0  . Multiple Vitamin (MULTIVITAMIN) tablet Take 1 tablet by mouth daily.      Marland Kitchen. omeprazole (PRILOSEC OTC) 20 MG tablet Take 20 mg by mouth daily.    . traMADol-acetaminophen (ULTRACET) 37.5-325 MG per tablet Take 1 tablet by mouth as needed. For pain 30 tablet 2  . traZODone (DESYREL) 100 MG tablet TAKE ONE TABLET BY MOUTH AT BEDTIME NEEDS  APPONTMENT  FOR  ADDITIONAL  REFILLS 30 tablet 0   No current facility-administered medications on file prior to visit.    Allergies  Allergen Reactions  . Januvia [Sitagliptin]     falls  . Penicillins     Family History  Problem Relation Age of Onset  . Stroke Mother   .  Coronary artery disease Father     BP 132/80 mmHg  Pulse 73  Temp(Src) 97.5 F (36.4 C) (Oral)  Ht 5' 4.75" (1.645 m)  Wt 202 lb (91.627 kg)  BMI 33.86 kg/m2  SpO2 95%  Review of Systems Denies weight change and hematuria.     Objective:   Physical Exam VITAL SIGNS:  See vs page GENERAL: no distress Pulses: dorsalis pedis intact bilat.   MSK: no deformity of the feet CV: 1+ bilat leg edema Skin:  no ulcer on the feet.  normal color and temp on the feet. Neuro: sensation is intact to touch on the feet   Lab Results  Component Value Date   WBC 7.7 02/27/2015   HGB 12.4 02/27/2015   HCT 37.0 02/27/2015   MCV 86.4 02/27/2015   PLT 232.0 02/27/2015   Lab Results  Component Value Date   HGBA1C 6.9* 02/27/2015   Lab Results  Component Value Date   IRON 38* 02/27/2015       Assessment &  Plan:  Cerumen impaction, new: Pt declines ear irrigation DM: well-controlled.  Please continue the same medications Anemia, better, but fe is still low  Patient is advised the following: Patient Instructions  blood tests are being requested for you today.  We'll send you a letter with the results.   Please come back for a follow-up appointment in 4 months.  check your blood sugar once a day.  vary the time of day when you check, between before the 3 meals, and at bedtime.  also check if you have symptoms of your blood sugar being too high or too low.  please keep a record of the readings and bring it to your next appointment here.  You can write it on any piece of paper.  please call us sooner if your blood sugar goes below 70, or if you have a lot of readings over 200.   Try putting peroxide into both ears, to unblock them  addendum: Good except the iron is a little low.  Take iron, 1 pill per day

## 2015-02-27 NOTE — Patient Instructions (Addendum)
blood tests are being requested for you today.  We'll send you a letter with the results.   Please come back for a follow-up appointment in 4 months.  check your blood sugar once a day.  vary the time of day when you check, between before the 3 meals, and at bedtime.  also check if you have symptoms of your blood sugar being too high or too low.  please keep a record of the readings and bring it to your next appointment here.  You can write it on any piece of paper.  please call us sooner if your blood sugar goes below 70, or if you have a lot of readings over 200.   Try putting peroxide into both ears, to unblock them

## 2015-02-27 NOTE — Progress Notes (Signed)
Pre visit review using our clinic review tool, if applicable. No additional management support is needed unless otherwise documented below in the visit note. 

## 2015-03-15 NOTE — Assessment & Plan Note (Signed)
She couldn't tolerate CPAP and at her age, intrusive therapy would not improve her quality of life. Plan-okay to try otc mouth piece she has spotted

## 2015-03-15 NOTE — Assessment & Plan Note (Signed)
Mostly a mild chronic bronchitis with some variability related to weather, recent colds etc. Known large hiatal hernia makes low-grade chronic reflux a likely basis for chronic cough. Reflux precautions emphasized.

## 2015-04-06 ENCOUNTER — Other Ambulatory Visit: Payer: Self-pay | Admitting: Endocrinology

## 2015-04-06 ENCOUNTER — Ambulatory Visit: Payer: Medicare Other | Admitting: Endocrinology

## 2015-05-10 ENCOUNTER — Other Ambulatory Visit: Payer: Self-pay | Admitting: Endocrinology

## 2015-05-17 ENCOUNTER — Encounter (HOSPITAL_COMMUNITY): Payer: Self-pay | Admitting: Nurse Practitioner

## 2015-05-17 ENCOUNTER — Emergency Department (HOSPITAL_COMMUNITY)
Admission: EM | Admit: 2015-05-17 | Discharge: 2015-05-17 | Disposition: A | Discharge status: Home / Self Care | Payer: Medicare HMO | Attending: Emergency Medicine | Admitting: Emergency Medicine

## 2015-05-17 DIAGNOSIS — Z7982 Long term (current) use of aspirin: Secondary | ICD-10-CM | POA: Diagnosis not present

## 2015-05-17 DIAGNOSIS — M810 Age-related osteoporosis without current pathological fracture: Secondary | ICD-10-CM | POA: Insufficient documentation

## 2015-05-17 DIAGNOSIS — E119 Type 2 diabetes mellitus without complications: Secondary | ICD-10-CM | POA: Insufficient documentation

## 2015-05-17 DIAGNOSIS — I10 Essential (primary) hypertension: Secondary | ICD-10-CM | POA: Diagnosis not present

## 2015-05-17 DIAGNOSIS — E780 Pure hypercholesterolemia: Secondary | ICD-10-CM | POA: Diagnosis not present

## 2015-05-17 DIAGNOSIS — Z880 Allergy status to penicillin: Secondary | ICD-10-CM | POA: Diagnosis not present

## 2015-05-17 DIAGNOSIS — K219 Gastro-esophageal reflux disease without esophagitis: Secondary | ICD-10-CM | POA: Diagnosis not present

## 2015-05-17 DIAGNOSIS — G4700 Insomnia, unspecified: Secondary | ICD-10-CM | POA: Insufficient documentation

## 2015-05-17 DIAGNOSIS — Z87828 Personal history of other (healed) physical injury and trauma: Secondary | ICD-10-CM | POA: Insufficient documentation

## 2015-05-17 DIAGNOSIS — Z79899 Other long term (current) drug therapy: Secondary | ICD-10-CM | POA: Diagnosis not present

## 2015-05-17 DIAGNOSIS — Z87448 Personal history of other diseases of urinary system: Secondary | ICD-10-CM | POA: Insufficient documentation

## 2015-05-17 DIAGNOSIS — Z86718 Personal history of other venous thrombosis and embolism: Secondary | ICD-10-CM | POA: Diagnosis not present

## 2015-05-17 DIAGNOSIS — H1131 Conjunctival hemorrhage, right eye: Secondary | ICD-10-CM

## 2015-05-17 DIAGNOSIS — M1990 Unspecified osteoarthritis, unspecified site: Secondary | ICD-10-CM | POA: Insufficient documentation

## 2015-05-17 DIAGNOSIS — E039 Hypothyroidism, unspecified: Secondary | ICD-10-CM | POA: Diagnosis not present

## 2015-05-17 DIAGNOSIS — H578 Other specified disorders of eye and adnexa: Secondary | ICD-10-CM | POA: Diagnosis present

## 2015-05-17 NOTE — Discharge Instructions (Signed)
Subconjunctival Hemorrhage °A subconjunctival hemorrhage is a bright red patch covering a portion of the white of the eye. The white part of the eye is called the sclera, and it is covered by a thin membrane called the conjunctiva. This membrane is clear, except for tiny blood vessels that you can see with the naked eye. When your eye is irritated or inflamed and becomes red, it is because the vessels in the conjunctiva are swollen. °Sometimes, a blood vessel in the conjunctiva can break and bleed. When this occurs, the blood builds up between the conjunctiva and the sclera, and spreads out to create a red area. The red spot may be very small at first. It may then spread to cover a larger part of the surface of the eye, or even all of the visible white part of the eye. °In almost all cases, the blood will go away and the eye will become white again. Before completely dissolving, however, the red area may spread. It may also become brownish-yellow in color before going away. If a lot of blood collects under the conjunctiva, it may look like a bulge on the surface of the eye. This looks scary, but it will also eventually flatten out and go away. Subconjunctival hemorrhages do not cause pain, but if swollen, may cause a feeling of irritation. There is no effect on vision.  °CAUSES  °· The most common cause is mild trauma (rubbing the eye, irritation). °· Subconjunctival hemorrhages can happen because of coughing or straining (lifting heavy objects), vomiting, or sneezing. °· In some cases, your doctor may want to check your blood pressure. High blood pressure can also cause a subconjunctival hemorrhage. °· Severe trauma or blunt injuries. °· Diseases that affect blood clotting (hemophilia, leukemia). °· Abnormalities of blood vessels behind the eye (carotid cavernous sinus fistula). °· Tumors behind the eye. °· Certain drugs (aspirin, Coumadin, heparin). °· Recent eye surgery. °HOME CARE INSTRUCTIONS  °· Do not worry  about the appearance of your eye. You may continue your usual activities. °· Often, follow-up is not necessary. °SEEK MEDICAL CARE IF:  °· Your eye becomes painful. °· The bleeding does not disappear within 3 weeks. °· Bleeding occurs elsewhere, for example, under the skin, in the mouth, or in the other eye. °· You have recurring subconjunctival hemorrhages. °SEEK IMMEDIATE MEDICAL CARE IF:  °· Your vision changes or you have difficulty seeing. °· You develop a severe headache, persistent vomiting, confusion, or abnormal drowsiness (lethargy). °· Your eye seems to bulge or protrude from the eye socket. °· You notice the sudden appearance of bruises or have spontaneous bleeding elsewhere on your body. °Document Released: 10/31/2005 Document Revised: 03/17/2014 Document Reviewed: 09/28/2009 °[employer]® Patient Information ©2015 [employer]. This information is not intended to replace advice given to you by your health care provider. Make sure you discuss any questions you have with your health care provider. ° °
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
## 2015-06-15 ENCOUNTER — Encounter: Payer: Self-pay | Admitting: Endocrinology

## 2015-06-29 ENCOUNTER — Encounter: Payer: Self-pay | Admitting: Endocrinology

## 2015-06-29 ENCOUNTER — Ambulatory Visit (INDEPENDENT_AMBULATORY_CARE_PROVIDER_SITE_OTHER): Payer: Medicare HMO | Admitting: Endocrinology

## 2015-06-29 VITALS — BP 140/90 | HR 96 | Temp 97.8°F | Ht 64.5 in | Wt 203.0 lb

## 2015-06-29 DIAGNOSIS — E1122 Type 2 diabetes mellitus with diabetic chronic kidney disease: Secondary | ICD-10-CM

## 2015-06-29 DIAGNOSIS — N189 Chronic kidney disease, unspecified: Secondary | ICD-10-CM

## 2015-06-29 LAB — POCT GLYCOSYLATED HEMOGLOBIN (HGB A1C): Hemoglobin A1C: 6.7

## 2015-06-29 MED ORDER — FUROSEMIDE 20 MG PO TABS: 20.0000 mg | ORAL_TABLET | Freq: Every day | ORAL | Status: DC

## 2015-06-29 NOTE — Patient Instructions (Addendum)
Please come back for a regular physical appointment in 4 months. check your blood sugar once a day.  vary the time of day when you check, between before the 3 meals, and at bedtime.  also check if you have symptoms of your blood sugar being too high or too low.  please keep a record of the readings and bring it to your next appointment here.  You can write it on any piece of paper.  please call us sooner if your blood sugar goes below 70, or if you have a lot of readings over 200.   Please continue the same metformin. Please increase the furosemide to 1 pill per day.  i have sent a prescription to your pharmacy.

## 2015-06-29 NOTE — Progress Notes (Signed)
   Subjective:    Patient ID: Destiny Flowers, female    DOB: 1929/02/09, 81 y.o.   MRN: [id number]  HPI Pt returns for f/u of diabetes mellitus: DM type: 2 Dx'ed: 1997 Complications: none Therapy: metformin.  GDM: G0 DKA: never Severe hypoglycemia: never Pancreatitis: never Other: pt stopped [place], as she said it was causing her to fall Interval history: no cbg record, but states cbg's are approx 100.  She denies hypoglycemia.     Review of Systems Edema persists    Objective:   Physical Exam VITAL SIGNS:  See vs page GENERAL: no distress Pulses: dorsalis pedis intact bilat.   MSK: no deformity of the feet CV: 1+ left leg edema (trace on the right).   Skin:  no ulcer on the feet.  normal color and temp on the feet. Neuro: sensation is intact to touch on the feet    A1c=6.7%    Assessment & Plan:  DM: well-controlled HTN: she needs increased rx Edema, persistent  Patient is advised the following: Patient Instructions  Please come back for a regular physical appointment in 4 months. check your blood sugar once a day.  vary the time of day when you check, between before the 3 meals, and at bedtime.  also check if you have symptoms of your blood sugar being too high or too low.  please keep a record of the readings and bring it to your next appointment here.  You can write it on any piece of paper.  please call us sooner if your blood sugar goes below 70, or if you have a lot of readings over 200.   Please continue the same metformin. Please increase the furosemide to 1 pill per day.  i have sent a prescription to your pharmacy.

## 2015-07-08 ENCOUNTER — Other Ambulatory Visit: Payer: Self-pay | Admitting: Endocrinology

## 2015-08-04 ENCOUNTER — Other Ambulatory Visit: Payer: Self-pay | Admitting: Endocrinology

## 2015-10-06 ENCOUNTER — Other Ambulatory Visit: Payer: Self-pay | Admitting: Endocrinology

## 2015-10-19 ENCOUNTER — Other Ambulatory Visit: Payer: Self-pay | Admitting: Endocrinology

## 2015-10-30 ENCOUNTER — Encounter: Payer: Self-pay | Admitting: Endocrinology

## 2015-10-30 ENCOUNTER — Ambulatory Visit (INDEPENDENT_AMBULATORY_CARE_PROVIDER_SITE_OTHER): Payer: Medicare HMO | Admitting: Endocrinology

## 2015-10-30 VITALS — BP 132/82 | HR 91 | Temp 98.0°F | Ht 64.5 in | Wt 201.0 lb

## 2015-10-30 DIAGNOSIS — M609 Myositis, unspecified: Secondary | ICD-10-CM

## 2015-10-30 DIAGNOSIS — D500 Iron deficiency anemia secondary to blood loss (chronic): Secondary | ICD-10-CM

## 2015-10-30 DIAGNOSIS — E559 Vitamin D deficiency, unspecified: Secondary | ICD-10-CM

## 2015-10-30 DIAGNOSIS — E1122 Type 2 diabetes mellitus with diabetic chronic kidney disease: Secondary | ICD-10-CM | POA: Diagnosis not present

## 2015-10-30 DIAGNOSIS — M791 Myalgia: Secondary | ICD-10-CM

## 2015-10-30 DIAGNOSIS — Z0000 Encounter for general adult medical examination without abnormal findings: Secondary | ICD-10-CM | POA: Diagnosis not present

## 2015-10-30 DIAGNOSIS — E7800 Pure hypercholesterolemia, unspecified: Secondary | ICD-10-CM

## 2015-10-30 DIAGNOSIS — IMO0001 Reserved for inherently not codable concepts without codable children: Secondary | ICD-10-CM | POA: Insufficient documentation

## 2015-10-30 DIAGNOSIS — Z23 Encounter for immunization: Secondary | ICD-10-CM

## 2015-10-30 DIAGNOSIS — M1A9XX0 Chronic gout, unspecified, without tophus (tophi): Secondary | ICD-10-CM

## 2015-10-30 DIAGNOSIS — M810 Age-related osteoporosis without current pathological fracture: Secondary | ICD-10-CM

## 2015-10-30 DIAGNOSIS — E039 Hypothyroidism, unspecified: Secondary | ICD-10-CM | POA: Diagnosis not present

## 2015-10-30 DIAGNOSIS — I10 Essential (primary) hypertension: Secondary | ICD-10-CM | POA: Diagnosis not present

## 2015-10-30 LAB — URINALYSIS, ROUTINE W REFLEX MICROSCOPIC
Bilirubin Urine: NEGATIVE
Hgb urine dipstick: NEGATIVE
Ketones, ur: NEGATIVE
Leukocytes, UA: NEGATIVE
Nitrite: NEGATIVE
PH: 5 (ref 5.0–8.0)
RBC / HPF: NONE SEEN (ref 0–?)
Specific Gravity, Urine: 1.02 (ref 1.000–1.030)
TOTAL PROTEIN, URINE-UPE24: NEGATIVE
URINE GLUCOSE: NEGATIVE
Urobilinogen, UA: 0.2 (ref 0.0–1.0)
WBC, UA: NONE SEEN (ref 0–?)

## 2015-10-30 LAB — CBC WITH DIFFERENTIAL/PLATELET
BASOS ABS: 0 10*3/uL (ref 0.0–0.1)
BASOS PCT: 0.5 % (ref 0.0–3.0)
EOS PCT: 1.6 % (ref 0.0–5.0)
Eosinophils Absolute: 0.1 10*3/uL (ref 0.0–0.7)
HCT: 38 % (ref 36.0–46.0)
Hemoglobin: 12.3 g/dL (ref 12.0–15.0)
LYMPHS ABS: 1.5 10*3/uL (ref 0.7–4.0)
Lymphocytes Relative: 23.7 % (ref 12.0–46.0)
MCHC: 32.5 g/dL (ref 30.0–36.0)
MCV: 90 fl (ref 78.0–100.0)
MONO ABS: 0.7 10*3/uL (ref 0.1–1.0)
MONOS PCT: 11.1 % (ref 3.0–12.0)
NEUTROS ABS: 4 10*3/uL (ref 1.4–7.7)
NEUTROS PCT: 63.1 % (ref 43.0–77.0)
PLATELETS: 256 10*3/uL (ref 150.0–400.0)
RBC: 4.22 Mil/uL (ref 3.87–5.11)
RDW: 13.9 % (ref 11.5–15.5)
WBC: 6.3 10*3/uL (ref 4.0–10.5)

## 2015-10-30 LAB — BASIC METABOLIC PANEL
BUN: 26 mg/dL — AB (ref 6–23)
CALCIUM: 9.7 mg/dL (ref 8.4–10.5)
CO2: 31 meq/L (ref 19–32)
CREATININE: 1.09 mg/dL (ref 0.40–1.20)
Chloride: 101 mEq/L (ref 96–112)
GFR: 50.76 mL/min — ABNORMAL LOW (ref >60.00)
GLUCOSE: 84 mg/dL (ref 70–99)
Potassium: 4.3 mEq/L (ref 3.5–5.1)
SODIUM: 140 meq/L (ref 135–145)

## 2015-10-30 LAB — URIC ACID: Uric Acid, Serum: 6 mg/dL (ref 2.4–7.0)

## 2015-10-30 LAB — MICROALBUMIN / CREATININE URINE RATIO
Creatinine,U: 64.6 mg/dL
Microalb Creat Ratio: 1.1 mg/g (ref 0.0–30.0)

## 2015-10-30 LAB — HEPATIC FUNCTION PANEL
ALT: 9 U/L (ref 0–35)
AST: 15 U/L (ref 0–37)
Albumin: 4 g/dL (ref 3.5–5.2)
Alkaline Phosphatase: 44 U/L (ref 39–117)
BILIRUBIN DIRECT: 0.1 mg/dL (ref 0.0–0.3)
BILIRUBIN TOTAL: 0.3 mg/dL (ref 0.2–1.2)
Total Protein: 7.1 g/dL (ref 6.0–8.3)

## 2015-10-30 LAB — LIPID PANEL
CHOL/HDL RATIO: 4
Cholesterol: 163 mg/dL (ref 0–200)
HDL: 44.4 mg/dL (ref >39.00)
LDL CALC: 88 mg/dL (ref 0–99)
NONHDL: 118.89
Triglycerides: 156 mg/dL — ABNORMAL HIGH (ref 0.0–149.0)
VLDL: 31.2 mg/dL (ref 0.0–40.0)

## 2015-10-30 LAB — IBC PANEL
Iron: 75 ug/dL (ref 42–145)
Saturation Ratios: 15.7 % — ABNORMAL LOW (ref 20.0–50.0)
Transferrin: 342 mg/dL (ref 212.0–360.0)

## 2015-10-30 LAB — POCT GLYCOSYLATED HEMOGLOBIN (HGB A1C): Hemoglobin A1C: 6.9

## 2015-10-30 LAB — TSH: TSH: 0.69 u[IU]/mL (ref 0.35–4.50)

## 2015-10-30 LAB — SEDIMENTATION RATE: SED RATE: 15 mm/h (ref 0–22)

## 2015-10-30 LAB — CK: CK TOTAL: 54 U/L (ref 7–177)

## 2015-10-30 NOTE — Progress Notes (Signed)
we discussed code status.  pt requests DNR 

## 2015-10-30 NOTE — Patient Instructions (Addendum)
Please come back for a follow-up appointment in 6 months.  please consider these measures for your health:  minimize alcohol.  do not use tobacco products.  have a colonoscopy at least every 10 years from age 79.  Women should have an annual mammogram from age 79.  keep firearms safely stored.  always use seat belts.  have working smoke alarms in your home.  see an eye doctor and dentist regularly.  never drive under the influence of alcohol or drugs (including prescription drugs).  those with fair skin should take precautions against the sun.  check your blood sugar once a day.  vary the time of day when you check, between before the 3 meals, and at bedtime.  also check if you have symptoms of your blood sugar being too high or too low.  please keep a record of the readings and bring it to your next appointment here.  You can write it on any piece of paper.  please call us sooner if your blood sugar goes below 70, or if you have a lot of readings over 200.   blood tests are requested for you today.  We'll let you know about the results.   If the muscle blood tests are normal, we could have the muscles tested at the neurologist office.  Please let me know if you want to do this.

## 2015-10-30 NOTE — Progress Notes (Signed)
Subjective:    Patient ID: Destiny Flowers, female    DOB: [birth year], 81 y.o.   MRN: [id number]  HPI Pt is here for regular wellness examination, and is feeling pretty well in general, and says chronic med probs are stable, except as noted below Past Medical History  Diagnosis Date  . DIABETES MELLITUS, TYPE II 06/06/2007  . GERD 06/06/2007  . HYPERCHOLESTEROLEMIA 02/19/2008  . HYPERTENSION 06/06/2007  . HYPOTHYROIDISM 06/06/2007  . OSTEOPOROSIS 06/06/2007  . RENAL INSUFFICIENCY 06/06/2007  . Dysthymic disorder   . Homocysteinemia ([place])   . ALLERGIC RHINITIS   . OSTEOARTHRITIS   . INSOMNIA   . DVT (deep venous thrombosis) ([facility])     Left leg  . OBSTRUCTIVE SLEEP APNEA     No past surgical history on file.  Social History   Social History  . Marital Status: Single    Spouse Name: N/A  . Number of Children: N/A  . Years of Education: N/A   Occupational History  . Not on file.   Social History Main Topics  . Smoking status: Passive Smoke Exposure - Never Smoker  . Smokeless tobacco: Not on file  . Alcohol Use: No  . Drug Use: No  . Sexual Activity: Not on file   Other Topics Concern  . Not on file   Social History Narrative    Current Outpatient Prescriptions on File Prior to Visit  Medication Sig Dispense Refill  . allopurinol (ZYLOPRIM) 100 MG tablet TAKE ONE TABLET BY MOUTH ONCE DAILY 90 tablet 0  . aspirin 81 MG tablet Take 81 mg by mouth daily.      . Cholecalciferol (VITAMIN D3) 1000 UNITS CAPS Take by mouth daily.      . folic acid (FOLVITE) 240 MCG tablet Take 800 mcg by mouth daily.      . furosemide (LASIX) 20 MG tablet Take 1 tablet (20 mg total) by mouth daily. 90 tablet 1  . levothyroxine (SYNTHROID, LEVOTHROID) 100 MCG tablet TAKE ONE TABLET BY MOUTH ONCE DAILY BEFORE BREAKFAST 90 tablet 0  . lisinopril (PRINIVIL,ZESTRIL) 40 MG tablet TAKE ONE TABLET BY MOUTH ONCE DAILY 90 tablet 0  . metFORMIN (GLUCOPHAGE-XR) 500 MG 24 hr tablet TAKE FOUR TABLETS BY  MOUTH ONCE DAILY IN THE MORNING 360 tablet 0  . Multiple Vitamin (MULTIVITAMIN) tablet Take 1 tablet by mouth daily.      Marland Kitchen omeprazole (PRILOSEC OTC) 20 MG tablet Take 20 mg by mouth daily.    . traMADol-acetaminophen (ULTRACET) 37.5-325 MG per tablet Take 1 tablet by mouth as needed. For pain 30 tablet 2  . traZODone (DESYREL) 100 MG tablet TAKE ONE TABLET BY MOUTH AT BEDTIME NEEDS  APPONTMENT  FOR  ADDITIONAL  REFILLS 30 tablet 0   No current facility-administered medications on file prior to visit.    Allergies  Allergen Reactions  . Januvia [Sitagliptin]     falls  . Penicillins Other (See Comments)    Unk    Family History  Problem Relation Age of Onset  . Stroke Mother   . Coronary artery disease Father     BP 132/82 mmHg  Pulse 91  Temp(Src) 98 F (36.7 C) (Oral)  Ht 5' 4.5" (1.638 m)  Wt 201 lb (91.173 kg)  BMI 33.98 kg/m2  SpO2 94%   Review of Systems  Constitutional: Negative for fever and unexpected weight change.  HENT:       No change in chronic hearing loss  Eyes: Negative for visual  disturbance.  Respiratory: Negative for shortness of breath.   Cardiovascular: Negative for chest pain.  Gastrointestinal: Negative for blood in stool.  Endocrine: Negative for cold intolerance.  Genitourinary: Negative for hematuria.  Musculoskeletal: Negative for back pain.  Skin: Negative for rash.       She has dry skin  Allergic/Immunologic: Negative for environmental allergies.  Neurological: Negative for syncope and headaches.  Hematological: Does not bruise/bleed easily.  Psychiatric/Behavioral: Negative for dysphoric mood.       Objective:   Physical Exam VS: see vs page GEN: no distress HEAD: head: no deformity eyes: no periorbital swelling, no proptosis external nose and ears are normal mouth: no lesion seen NECK: supple, thyroid is not enlarged CHEST WALL: no deformity LUNGS:  Clear to auscultation CV: reg rate and rhythm, no murmur.  ABD: abdomen  is soft, nontender.  no hepatosplenomegaly.  not distended.  no hernia MUSCULOSKELETAL: muscle bulk and strength are grossly normal.  no obvious joint swelling.  gait is steady with a cane EXTEMITIES: no deformity.  no ulcer on the feet.  feet are of normal color and temp.  1+ left leg edema (trace on the right) PULSES: dorsalis pedis intact bilat.  no carotid bruit NEURO:  cn 2-12 grossly intact.   readily moves all 4's.  sensation is intact to touch on the feet SKIN:  Normal texture and temperature.  No rash or suspicious lesion is visible.   NODES:  None palpable at the neck PSYCH: alert, well-oriented.  Does not appear anxious nor depressed.   i personally reviewed electrocardiogram tracing (today): Indication: DM Impression: Left axis -anterior fascicular block.     Assessment & Plan:  Wellness visit today, with problems stable, except as noted.  SEPARATE EVALUATION FOLLOWS--EACH PROBLEM HERE IS NEW, NOT RESPONDING TO TREATMENT, OR POSES SIGNIFICANT RISK TO THE PATIENT'S HEALTH: HISTORY OF THE PRESENT ILLNESS: Pt states 1 month of moderate pain at both thighs, but no assoc numbness.  She is unable to cite precip factor.   PAST MEDICAL HISTORY reviewed and up to date today REVIEW OF SYSTEMS: Denies fever PHYSICAL EXAMINATION: VITAL SIGNS:  See vs page GENERAL: no distress Thighs: slightly tender to touch LAB/XRAY RESULTS: ESR and CPK are normal IMPRESSION: Myalgias, new, uncertain etiology PLAN:  we could have the muscles tested at the neurologist office.  Please let me know if you want to do this.

## 2015-10-31 LAB — HIV ANTIBODY (ROUTINE TESTING W REFLEX): HIV: NONREACTIVE

## 2015-11-02 ENCOUNTER — Telehealth: Payer: Self-pay | Admitting: Endocrinology

## 2015-11-02 LAB — PTH, INTACT AND CALCIUM
Calcium: 9.4 mg/dL (ref 8.4–10.5)
PTH: 52 pg/mL (ref 14–64)

## 2015-11-02 NOTE — Telephone Encounter (Signed)
I contacted the pt and left a voicemail advising her lab results are normal.

## 2015-11-02 NOTE — Telephone Encounter (Signed)
Patient stated you called her, she is returning your call

## 2015-11-05 ENCOUNTER — Other Ambulatory Visit: Payer: Self-pay | Admitting: Endocrinology

## 2015-11-19 ENCOUNTER — Ambulatory Visit (INDEPENDENT_AMBULATORY_CARE_PROVIDER_SITE_OTHER)
Admission: RE | Admit: 2015-11-19 | Discharge: 2015-11-19 | Disposition: A | Discharge status: Home / Self Care | Payer: PPO | Source: Ambulatory Visit | Attending: Endocrinology | Admitting: Endocrinology

## 2015-11-19 DIAGNOSIS — M810 Age-related osteoporosis without current pathological fracture: Secondary | ICD-10-CM

## 2015-12-08 DIAGNOSIS — E1151 Type 2 diabetes mellitus with diabetic peripheral angiopathy without gangrene: Secondary | ICD-10-CM | POA: Diagnosis not present

## 2015-12-08 DIAGNOSIS — L84 Corns and callosities: Secondary | ICD-10-CM | POA: Diagnosis not present

## 2015-12-08 DIAGNOSIS — B351 Tinea unguium: Secondary | ICD-10-CM | POA: Diagnosis not present

## 2015-12-09 ENCOUNTER — Telehealth: Payer: Self-pay | Admitting: Endocrinology

## 2015-12-09 NOTE — Telephone Encounter (Signed)
done

## 2015-12-09 NOTE — Telephone Encounter (Signed)
Patient would not leave a detailed message and would like to have Megan please call her   Thank you

## 2015-12-09 NOTE — Telephone Encounter (Signed)
Patient called stating she is ok with having the reclast infusion done. Order sheet placed on Md's desk to complete.

## 2015-12-10 NOTE — Telephone Encounter (Signed)
Orders given to [facility]( infusion nurse) to schedule the pt.

## 2016-01-11 ENCOUNTER — Ambulatory Visit (INDEPENDENT_AMBULATORY_CARE_PROVIDER_SITE_OTHER): Payer: PPO | Admitting: Endocrinology

## 2016-01-11 DIAGNOSIS — M810 Age-related osteoporosis without current pathological fracture: Secondary | ICD-10-CM | POA: Diagnosis not present

## 2016-01-12 ENCOUNTER — Other Ambulatory Visit: Payer: Self-pay | Admitting: Endocrinology

## 2016-02-04 ENCOUNTER — Other Ambulatory Visit: Payer: Self-pay | Admitting: Endocrinology

## 2016-02-16 DIAGNOSIS — B351 Tinea unguium: Secondary | ICD-10-CM | POA: Diagnosis not present

## 2016-02-16 DIAGNOSIS — L84 Corns and callosities: Secondary | ICD-10-CM | POA: Diagnosis not present

## 2016-02-16 DIAGNOSIS — E1151 Type 2 diabetes mellitus with diabetic peripheral angiopathy without gangrene: Secondary | ICD-10-CM | POA: Diagnosis not present

## 2016-02-18 ENCOUNTER — Encounter: Payer: Self-pay | Admitting: Internal Medicine

## 2016-02-18 ENCOUNTER — Ambulatory Visit (INDEPENDENT_AMBULATORY_CARE_PROVIDER_SITE_OTHER)
Admission: RE | Admit: 2016-02-18 | Discharge: 2016-02-18 | Disposition: A | Discharge status: Home / Self Care | Payer: PPO | Source: Ambulatory Visit | Attending: Internal Medicine | Admitting: Internal Medicine

## 2016-02-18 ENCOUNTER — Ambulatory Visit (INDEPENDENT_AMBULATORY_CARE_PROVIDER_SITE_OTHER): Payer: PPO | Admitting: Internal Medicine

## 2016-02-18 VITALS — BP 120/68 | HR 73 | Ht 64.75 in | Wt 197.8 lb

## 2016-02-18 DIAGNOSIS — R05 Cough: Secondary | ICD-10-CM

## 2016-02-18 DIAGNOSIS — J302 Other seasonal allergic rhinitis: Secondary | ICD-10-CM

## 2016-02-18 DIAGNOSIS — R053 Chronic cough: Secondary | ICD-10-CM

## 2016-02-18 DIAGNOSIS — J309 Allergic rhinitis, unspecified: Secondary | ICD-10-CM | POA: Diagnosis not present

## 2016-02-18 DIAGNOSIS — J440 Chronic obstructive pulmonary disease with acute lower respiratory infection: Secondary | ICD-10-CM | POA: Diagnosis not present

## 2016-02-18 DIAGNOSIS — G4733 Obstructive sleep apnea (adult) (pediatric): Secondary | ICD-10-CM

## 2016-02-18 DIAGNOSIS — J3089 Other allergic rhinitis: Secondary | ICD-10-CM

## 2016-02-18 NOTE — Patient Instructions (Signed)
Ok to try an otc antihistamine like Claritin/ loratadine to see if it helps your morning cough  Order- CXR     Dx chronic cough

## 2016-02-18 NOTE — Progress Notes (Signed)
Patient ID: Destiny Flowers, female    DOB: 09/25/1929, 82 y.o.   MRN: [id number]  HPI 07/14/11- 82 year old female with history of passive smoking, followed for obstructive sleep apnea, allergic rhinitis, complicated by hypothyroid, DM, HBP, renal insufficiency Last here January 07, 2011. CPAP 11- wakes and takes it off in sleep, feeling somebody has "got" her. Discussed going to the closer [facility] branch in [place] so that she can discuss mask fitting and machine issues directly with her home care company.. She feels confined by the mask,not smothered by the pressure..  Left leg- the side that had remote DVT- has noted a bit of painless swelling on dorsum of left foot.  Coughs a little clear mucus in the morning. Can't walk easily and asks for handicaped parking.   11/11/11- 82 year old female with history of passive smoking, followed for obstructive sleep apnea, allergic rhinitis, complicated by hypothyroid, DM, HBP, renal insufficiency. Friend here. Acute visit-haorseness, cough-yellow in color; increased SOB since Christmas eve; fever and chills. 4 days blowing and cough. Persistent nasal congestion. Denies sore throat, fever, GI upset. Nothing purulent or painful.  02/13/13- 82 year old female with history of passive smoking, followed for obstructive sleep apnea, allergic rhinitis, complicated by hypothyroid, DM, HBP, renal insufficiency.  FOLLOWS WUJ:WJXBFOR:last seen 2012; cough-productive and deep-yellow in color; SOB and wheezing as well. Acute visit- 4 days increased cough, nasal congestion, rhinorhea, yellow. No fever, sore throat.  Blames pollen. Also aware of water problem under house- being worked on.  Baseline bothersome dry cough- mild. We discussed lisinopril/ ACEI  taken for renal preservation. CXR 11/24/11 IMPRESSION:  1. Airway thickening may reflect bronchitis or reactive airways  disease. No airspace opacity is identified to suggest bacterial  pneumonia pattern.  2. Moderate  sized hiatal hernia.  Original Report Authenticated By: Dellia CloudWALTER D. LIEBKEMANN, M.D.  02/13/14- 82 year old female with history of passive smoking, followed for obstructive sleep apnea, allergic rhinitis, complicated by hypothyroid, DM, HBP, renal insufficiency.  FOLLOWS FOR:  Still having cough with light yellow mucus   Persistent cough. Z-Pak then doxycycline did not help. Sputum remains yellow. History of penicillin allergy with swelling. CXR 02/14/13 IMPRESSION:  No acute cardiopulmonary abnormality.  Original Report Authenticated By: Janeece Riggersavid Clark, M.D.  02/18/15- 82 year old female with history of passive smoking, followed for obstructive sleep apnea, allergic rhinitis, complicated by Hiatal Hernia, hypothyroid, DM, HBP, renal insufficiency.  Follows for: prod cough w/beige mucus; denies wheezing. We discussed history of sleep apnea. She is not using CPAP-she would either wake up scared with it on, or find that she had pulled it off in her sleep. She wants to try otc mouth piece for snoring. CXR 10/17/14-  IMPRESSION: No active disease. Stable large hiatal hernia. Electronically Signed  By: Irish LackGlenn Yamagata M.D.  On: 10/17/2014 14:44  02/18/2016-82 year old female with history of passive smoking, followed for OSA/failed CPAP, allergic rhinitis, complicated by Hiatal hernia, hypothyroid, DM, HBP, renal insufficiency FOLLOWS FOR:Pt states she does not sleep well at all; does not use CPAP. Pt states she has congestion and when able to cough something up its yellow in color about 10% of the time. Morning cough productive on a chronic basis, only occasionally yellow. No fever. She wants to try an antihistamine. We discussed her sleep habits. At her age she has no intention of trying to treat sleep apnea again.  Review of Systems-see HPI Constitutional:  No-weight loss,  No-night sweats, fevers, chills, fatigue, lassitude. HEENT:   No-  headaches, difficulty swallowing, tooth/dental problems,  sore throat,         sneezing, itching, ear ache, nasal congestion, post nasal drip,  CV:  No-   chest pain, orthopnea, PND, swelling in lower extremities, anasarca, dizziness, palpitations Resp: Some acute  shortness of breath with exertion or at rest.              + productive cough,  +non-productive cough,  No-  coughing up of blood.              + change in color of mucus.  No- wheezing.   Skin: No-   rash or lesions. GI:  No-   heartburn, indigestion, abdominal pain, nausea, vomiting, GU:  MS:  No-   joint pain,  swelling.  . Neuro- nothing unusual Psych:  No- change in mood or affect. No depression or anxiety.  No memory loss.  Objective:   Physical Exam  General- Alert, Oriented, Affect-appropriate, Distress- none acute     +Talkative, elderly, + Obese Skin- rash-none, lesions- none, excoriation- none Lymphadenopathy- none Head- atraumatic            Eyes- Gross vision intact, PERRLA, conjunctivae clear secretions            Ears- Hearing, canals normal            Nose- +sniffing, No- Septal dev, mucus, polyps, erosion, perforation             Throat- Mallampati II , mucosa clear , drainage- none, tonsils- atrophic Neck- flexible , trachea midline, no stridor , thyroid nl, carotid no bruit Chest - symmetrical excursion , unlabored           Heart/CV- RRR , no murmur , no gallop  , no rub, nl s1 s2                           - JVD- none , +edema- trace, dorsum left foot, stasis changes- none, varices- none               Lungs-Clear, cough + minimal, dullness-none, rub- none.            Chest wall-  Abd- +borborygmi Br/ Gen/ Rectal- Not done, not indicated Extrem- cyanosis- none, clubbing, none, atrophy- none, strength- nl     Neuro- grossly intact to observation

## 2016-03-05 NOTE — Assessment & Plan Note (Signed)
At this age, pollen is likely to be just an irritant. Seasonal allergy would be less common than vasomotor rhinitis

## 2016-03-05 NOTE — Assessment & Plan Note (Signed)
Failed CPAP and not interested in addressing the issue.

## 2016-03-05 NOTE — Assessment & Plan Note (Signed)
Mostly mild chronic bronchitis now Plan-chest x-ray

## 2016-03-23 NOTE — Progress Notes (Unsigned)
Per Dr. George HughEllison's order on 219/17, and the patient's written consent, and IV of Normal saline was started in the patients' left arm.  After 5 min., 5mg /11500ml of zolendronic acid was infused, starting at 10:20AM for 25 min.  After flushing with Normal Saline, the IV was DC and the site showed no signs of redness or swelling.  She denied any nausea or light headedness.  She was encouraged to drink large amounts of water today, and continue taking Calcium and Vit. D per Dr. George HughEllison's orders.  She agreed to do this.

## 2016-03-23 NOTE — Progress Notes (Deleted)
Subjective:     Patient ID: Destiny EarthlyMarilyn A Seever, female   DOB: 06/14/1929, 82 y.o.   MRN: [id number]  HPI   Review of Systems     Objective:   Physical Exam     Assessment:     ***    Plan:     ***

## 2016-04-09 ENCOUNTER — Other Ambulatory Visit: Payer: Self-pay | Admitting: Endocrinology

## 2016-04-22 ENCOUNTER — Encounter: Payer: Self-pay | Admitting: Endocrinology

## 2016-04-22 ENCOUNTER — Ambulatory Visit (INDEPENDENT_AMBULATORY_CARE_PROVIDER_SITE_OTHER): Payer: PPO | Admitting: Endocrinology

## 2016-04-22 ENCOUNTER — Ambulatory Visit
Admission: RE | Admit: 2016-04-22 | Discharge: 2016-04-22 | Disposition: A | Discharge status: Home / Self Care | Payer: PPO | Source: Ambulatory Visit | Attending: Endocrinology | Admitting: Endocrinology

## 2016-04-22 VITALS — BP 137/68 | HR 82 | Temp 98.7°F | Ht 64.75 in | Wt 200.0 lb

## 2016-04-22 DIAGNOSIS — E1111 Type 2 diabetes mellitus with ketoacidosis with coma: Secondary | ICD-10-CM

## 2016-04-22 DIAGNOSIS — R059 Cough, unspecified: Secondary | ICD-10-CM

## 2016-04-22 DIAGNOSIS — Z794 Long term (current) use of insulin: Secondary | ICD-10-CM

## 2016-04-22 DIAGNOSIS — R05 Cough: Secondary | ICD-10-CM

## 2016-04-22 DIAGNOSIS — E1311 Other specified diabetes mellitus with ketoacidosis with coma: Secondary | ICD-10-CM | POA: Diagnosis not present

## 2016-04-22 LAB — POCT GLYCOSYLATED HEMOGLOBIN (HGB A1C): Hemoglobin A1C: 6.6

## 2016-04-22 MED ORDER — BENZONATATE 100 MG PO CAPS: 100.0000 mg | ORAL_CAPSULE | Freq: Three times a day (TID) | ORAL | Status: DC | PRN

## 2016-04-22 MED ORDER — AZITHROMYCIN 500 MG PO TABS: 500.0000 mg | ORAL_TABLET | Freq: Every day | ORAL | Status: DC

## 2016-04-22 MED ORDER — FLUTICASONE-SALMETEROL 100-50 MCG/DOSE IN AEPB: 1.0000 | INHALATION_SPRAY | Freq: Two times a day (BID) | RESPIRATORY_TRACT | Status: DC

## 2016-04-22 NOTE — Patient Instructions (Addendum)
Let's check a chest x-ray. i have sent 3 prescriptions to your pharmacy: antibiotic, inhaler, and non-drowsy cough pills. I hope you feel better soon.  If you don't feel better in 1 week, please call back.  Please call sooner if you get worse.

## 2016-04-22 NOTE — Progress Notes (Signed)
Subjective:    Patient ID: Destiny Flowers, female    DOB: 01/16/1929, 82 y.o.   MRN: [id number]  HPI Pt states a few days of moderate cough in the chest, and assoc sore throat Past Medical History  Diagnosis Date  . DIABETES MELLITUS, TYPE II 06/06/2007  . GERD 06/06/2007  . HYPERCHOLESTEROLEMIA 02/19/2008  . HYPERTENSION 06/06/2007  . HYPOTHYROIDISM 06/06/2007  . OSTEOPOROSIS 06/06/2007  . RENAL INSUFFICIENCY 06/06/2007  . Dysthymic disorder   . Homocysteinemia (HCC)   . ALLERGIC RHINITIS   . OSTEOARTHRITIS   . INSOMNIA   . DVT (deep venous thrombosis) (HCC)     Left leg  . OBSTRUCTIVE SLEEP APNEA     No past surgical history on file.  Social History   Social History  . Marital Status: Single    Spouse Name: N/A  . Number of Children: N/A  . Years of Education: N/A   Occupational History  . Not on file.   Social History Main Topics  . Smoking status: Passive Smoke Exposure - Never Smoker  . Smokeless tobacco: Not on file  . Alcohol Use: No  . Drug Use: No  . Sexual Activity: Not on file   Other Topics Concern  . Not on file   Social History Narrative    Current Outpatient Prescriptions on File Prior to Visit  Medication Sig Dispense Refill  . aspirin 81 MG tablet Take 81 mg by mouth daily.      . Cholecalciferol (VITAMIN D3) 1000 UNITS CAPS Take by mouth daily.      . folic acid (FOLVITE) 800 MCG tablet Take 800 mcg by mouth daily.    . furosemide (LASIX) 20 MG tablet Take 1 tablet (20 mg total) by mouth daily. 90 tablet 1  . levothyroxine (SYNTHROID, LEVOTHROID) 100 MCG tablet TAKE ONE TABLET BY MOUTH ONCE DAILY BEFORE BREAKFAST 90 tablet 0  . lisinopril (PRINIVIL,ZESTRIL) 40 MG tablet TAKE ONE TABLET BY MOUTH ONCE DAILY 90 tablet 0  . metFORMIN (GLUCOPHAGE-XR) 500 MG 24 hr tablet TAKE FOUR TABLETS BY MOUTH ONCE DAILY IN THE MORNING 360 tablet 0  . Multiple Vitamin (MULTIVITAMIN) tablet Take 1 tablet by mouth daily.      Marland Kitchen. omeprazole (PRILOSEC OTC) 20 MG  tablet Take 20 mg by mouth daily.    . traMADol-acetaminophen (ULTRACET) 37.5-325 MG per tablet Take 1 tablet by mouth as needed. For pain 30 tablet 2  . traZODone (DESYREL) 100 MG tablet TAKE ONE TABLET BY MOUTH ONCE DAILY AT BEDTIME 30 tablet 0   No current facility-administered medications on file prior to visit.    Allergies  Allergen Reactions  . Januvia [Sitagliptin]     falls  . Penicillins Other (See Comments)    Unk    Family History  Problem Relation Age of Onset  . Stroke Mother   . Coronary artery disease Father     BP 137/68 mmHg  Pulse 82  Temp(Src) 98.7 F (37.1 C) (Oral)  Ht 5' 4.75" (1.645 m)  Wt 200 lb (90.719 kg)  BMI 33.52 kg/m2  SpO2 95%  Review of Systems She has wheezing, but no fever.  She has slight sob    Objective:   Physical Exam head: no deformity eyes: no periorbital swelling, no proptosis external nose and ears are normal mouth: no lesion seen Both eac's and tm's are normal LUNGS:  Clear to auscultation, except for rales at the left base.     Lab Results  Component Value  Date   HGBA1C 6.6 04/22/2016      Assessment & Plan:  Acute bronchitis, new DM: well-controlled  Patient is advised the following: Patient Instructions  Let's check a chest x-ray. i have sent 3 prescriptions to your pharmacy: antibiotic, inhaler, and non-drowsy cough pills. I hope you feel better soon.  If you don't feel better in 1 week, please call back.  Please call sooner if you get worse.   Romero Belling, MD

## 2016-04-22 NOTE — Progress Notes (Signed)
Pre visit review using our clinic tool,if applicable. No additional management support is needed unless otherwise documented below in the visit note.  

## 2016-04-26 DIAGNOSIS — L84 Corns and callosities: Secondary | ICD-10-CM | POA: Diagnosis not present

## 2016-04-26 DIAGNOSIS — B351 Tinea unguium: Secondary | ICD-10-CM | POA: Diagnosis not present

## 2016-04-26 DIAGNOSIS — E1151 Type 2 diabetes mellitus with diabetic peripheral angiopathy without gangrene: Secondary | ICD-10-CM | POA: Diagnosis not present

## 2016-04-29 ENCOUNTER — Ambulatory Visit (INDEPENDENT_AMBULATORY_CARE_PROVIDER_SITE_OTHER): Payer: PPO | Admitting: Endocrinology

## 2016-04-29 ENCOUNTER — Encounter: Payer: Self-pay | Admitting: Endocrinology

## 2016-04-29 VITALS — BP 142/60 | HR 85 | Temp 98.4°F | Ht 64.75 in | Wt 200.4 lb

## 2016-04-29 DIAGNOSIS — I10 Essential (primary) hypertension: Secondary | ICD-10-CM | POA: Diagnosis not present

## 2016-04-29 MED ORDER — FLUTICASONE-SALMETEROL 100-50 MCG/DOSE IN AEPB: 1.0000 | INHALATION_SPRAY | Freq: Two times a day (BID) | RESPIRATORY_TRACT | Status: DC

## 2016-04-29 MED ORDER — LOSARTAN POTASSIUM 100 MG PO TABS: 100.0000 mg | ORAL_TABLET | Freq: Every day | ORAL | Status: DC

## 2016-04-29 NOTE — Progress Notes (Signed)
Pre visit review using our clinic review tool, if applicable. No additional management support is needed unless otherwise documented below in the visit note. 

## 2016-04-29 NOTE — Patient Instructions (Addendum)
i have resent the prescription to your pharmacy, for the inhaler.   i have also sent a prescription to your pharmacy, to change the lisinopril to a pill that does not cause cough or wheezing.  Please come back for a follow-up appointment in 3 months.

## 2016-04-29 NOTE — Progress Notes (Signed)
Subjective:    Patient ID: Destiny Flowers, female    DOB: 12/12/1928, 82 y.o.   MRN: [id number]  HPI Pt reports 10 days of slight wheezing in the chest, and assoc prod cough.  She feels slightly better since ov last week, but she say [facility] did not receive rx for MDI ([facility] says it was sent).   Past Medical History  Diagnosis Date  . DIABETES MELLITUS, TYPE II 06/06/2007  . GERD 06/06/2007  . HYPERCHOLESTEROLEMIA 02/19/2008  . HYPERTENSION 06/06/2007  . HYPOTHYROIDISM 06/06/2007  . OSTEOPOROSIS 06/06/2007  . RENAL INSUFFICIENCY 06/06/2007  . Dysthymic disorder   . Homocysteinemia (HCC)   . ALLERGIC RHINITIS   . OSTEOARTHRITIS   . INSOMNIA   . DVT (deep venous thrombosis) (HCC)     Left leg  . OBSTRUCTIVE SLEEP APNEA     No past surgical history on file.  Social History   Social History  . Marital Status: Single    Spouse Name: N/A  . Number of Children: N/A  . Years of Education: N/A   Occupational History  . Not on file.   Social History Main Topics  . Smoking status: Passive Smoke Exposure - Never Smoker  . Smokeless tobacco: Not on file  . Alcohol Use: No  . Drug Use: No  . Sexual Activity: Not on file   Other Topics Concern  . Not on file   Social History Narrative    Current Outpatient Prescriptions on File Prior to Visit  Medication Sig Dispense Refill  . aspirin 81 MG tablet Take 81 mg by mouth daily.      . benzonatate (TESSALON) 100 MG capsule Take 1 capsule (100 mg total) by mouth 3 (three) times daily as needed for cough. 30 capsule 1  . Cholecalciferol (VITAMIN D3) 1000 UNITS CAPS Take by mouth daily.      . folic acid (FOLVITE) 800 MCG tablet Take 800 mcg by mouth daily.    . furosemide (LASIX) 20 MG tablet Take 1 tablet (20 mg total) by mouth daily. 90 tablet 1  . levothyroxine (SYNTHROID, LEVOTHROID) 100 MCG tablet TAKE ONE TABLET BY MOUTH ONCE DAILY BEFORE BREAKFAST 90 tablet 0  . metFORMIN (GLUCOPHAGE-XR) 500 MG 24 hr tablet TAKE FOUR TABLETS BY  MOUTH ONCE DAILY IN THE MORNING 360 tablet 0  . Multiple Vitamin (MULTIVITAMIN) tablet Take 1 tablet by mouth daily.      Marland Kitchen. omeprazole (PRILOSEC OTC) 20 MG tablet Take 20 mg by mouth daily.    . traMADol-acetaminophen (ULTRACET) 37.5-325 MG per tablet Take 1 tablet by mouth as needed. For pain 30 tablet 2  . traZODone (DESYREL) 100 MG tablet TAKE ONE TABLET BY MOUTH ONCE DAILY AT BEDTIME 30 tablet 0   No current facility-administered medications on file prior to visit.    Allergies  Allergen Reactions  . Januvia [Sitagliptin]     falls  . Penicillins Other (See Comments)    Unk    Family History  Problem Relation Age of Onset  . Stroke Mother   . Coronary artery disease Father     BP 142/60 mmHg  Pulse 85  Temp(Src) 98.4 F (36.9 C) (Oral)  Ht 5' 4.75" (1.645 m)  Wt 200 lb 6 oz (90.89 kg)  BMI 33.59 kg/m2  SpO2 95%   Review of Systems Denies fever and sob    Objective:   Physical Exam head: no deformity eyes: no periorbital swelling, no proptosis external nose and ears are normal mouth:  no lesion seen Both eac's and tm's are normal LUNGS:  Clear to auscultation      Assessment & Plan:  Scute bronchitis, slightly improved.  She declines another CXR HTN: slightly worse, with probable situational component.  zestril may exac cough and wheezing, so we'll change to arb  Patient is advised the following: Patient Instructions  i have resent the prescription to your pharmacy, for the inhaler.   i have also sent a prescription to your pharmacy, to change the lisinopril to a pill that does not cause cough or wheezing.  Please come back for a follow-up appointment in 3 months.    Romero Belling, MD

## 2016-05-05 ENCOUNTER — Other Ambulatory Visit: Payer: Self-pay | Admitting: Endocrinology

## 2016-06-07 ENCOUNTER — Encounter: Payer: Self-pay | Admitting: Endocrinology

## 2016-06-07 DIAGNOSIS — Z1231 Encounter for screening mammogram for malignant neoplasm of breast: Secondary | ICD-10-CM | POA: Diagnosis not present

## 2016-07-29 ENCOUNTER — Ambulatory Visit (INDEPENDENT_AMBULATORY_CARE_PROVIDER_SITE_OTHER): Payer: PPO | Admitting: Endocrinology

## 2016-07-29 VITALS — BP 134/72 | HR 77 | Wt 200.0 lb

## 2016-07-29 DIAGNOSIS — E1122 Type 2 diabetes mellitus with diabetic chronic kidney disease: Secondary | ICD-10-CM

## 2016-07-29 DIAGNOSIS — Z23 Encounter for immunization: Secondary | ICD-10-CM

## 2016-07-29 LAB — POCT GLYCOSYLATED HEMOGLOBIN (HGB A1C): Hemoglobin A1C: 6.1

## 2016-07-29 MED ORDER — TRAZODONE HCL 100 MG PO TABS: 100.0000 mg | ORAL_TABLET | Freq: Every day | ORAL | 11 refills | Status: AC

## 2016-07-29 NOTE — Patient Instructions (Addendum)
check your blood sugar once a day.  vary the time of day when you check, between before the 3 meals, and at bedtime.  also check if you have symptoms of your blood sugar being too high or too low.  please keep a record of the readings and bring it to your next appointment here (or you can bring the meter itself).  You can write it on any piece of paper.  please call us sooner if your blood sugar goes below 70, or if you have a lot of readings over 200. Please continue the same medications.    Please come back for a regular physical appointment in 4 months.

## 2016-07-29 NOTE — Progress Notes (Signed)
Subjective:    Patient ID: Destiny Flowers, female    DOB: 07-Jun-1929, 82 y.o.   MRN: [id number]  HPI Pt returns for f/u of diabetes mellitus: DM type: 2 Dx'ed: 1997 Complications: none Therapy: metformin.  GDM: G0 DKA: never Severe hypoglycemia: never.  Pancreatitis: never.  Other: pt stopped [place], as she said it was causing her to fall.   Interval history: no cbg record, but states cbg's are well-controlled.  She denies hypoglycemia.   Past Medical History:  Diagnosis Date  . ALLERGIC RHINITIS   . DIABETES MELLITUS, TYPE II 06/06/2007  . DVT (deep venous thrombosis) (HCC)    Left leg  . Dysthymic disorder   . GERD 06/06/2007  . Homocysteinemia (HCC)   . HYPERCHOLESTEROLEMIA 02/19/2008  . HYPERTENSION 06/06/2007  . HYPOTHYROIDISM 06/06/2007  . INSOMNIA   . OBSTRUCTIVE SLEEP APNEA   . OSTEOARTHRITIS   . OSTEOPOROSIS 06/06/2007  . RENAL INSUFFICIENCY 06/06/2007    No past surgical history on file.  Social History   Social History  . Marital status: Single    Spouse name: N/A  . Number of children: N/A  . Years of education: N/A   Occupational History  . Not on file.   Social History Main Topics  . Smoking status: Passive Smoke Exposure - Never Smoker  . Smokeless tobacco: Not on file  . Alcohol use No  . Drug use: No  . Sexual activity: Not on file   Other Topics Concern  . Not on file   Social History Narrative  . No narrative on file    Current Outpatient Prescriptions on File Prior to Visit  Medication Sig Dispense Refill  . aspirin 81 MG tablet Take 81 mg by mouth daily.      . Cholecalciferol (VITAMIN D3) 1000 UNITS CAPS Take by mouth daily.      . folic acid (FOLVITE) 800 MCG tablet Take 800 mcg by mouth daily.    . furosemide (LASIX) 20 MG tablet Take 1 tablet (20 mg total) by mouth daily. 90 tablet 1  . levothyroxine (SYNTHROID, LEVOTHROID) 100 MCG tablet TAKE ONE TABLET BY MOUTH ONCE DAILY BEFORE BREAKFAST 90 tablet 1  . losartan (COZAAR) 100  MG tablet Take 1 tablet (100 mg total) by mouth daily. 90 tablet 3  . metFORMIN (GLUCOPHAGE-XR) 500 MG 24 hr tablet TAKE FOUR TABLETS BY MOUTH ONCE DAILY IN THE MORNING 360 tablet 1  . Multiple Vitamin (MULTIVITAMIN) tablet Take 1 tablet by mouth daily.      Marland Kitchen omeprazole (PRILOSEC OTC) 20 MG tablet Take 20 mg by mouth daily.    . traMADol-acetaminophen (ULTRACET) 37.5-325 MG per tablet Take 1 tablet by mouth as needed. For pain 30 tablet 2   No current facility-administered medications on file prior to visit.     Allergies  Allergen Reactions  . Januvia [Sitagliptin]     falls  . Penicillins Other (See Comments)    Unk    Family History  Problem Relation Age of Onset  . Stroke Mother   . Coronary artery disease Father     BP 134/72   Pulse 77   Wt 200 lb (90.7 kg)   SpO2 93%   BMI 33.54 kg/m    Review of Systems No weight change.      Objective:   Physical Exam VITAL SIGNS:  See vs page GENERAL: no distress Pulses: dorsalis pedis intact bilat.   MSK: no deformity of the feet CV: 1+ bilat leg edema  Skin:  no ulcer on the feet.  normal color and temp on the feet. Neuro: sensation is intact to touch on the feet   A1c=6.1%    Assessment & Plan:  Type 2 DM: well-controlled

## 2016-08-25 ENCOUNTER — Other Ambulatory Visit: Payer: Self-pay | Admitting: Endocrinology

## 2016-11-03 ENCOUNTER — Other Ambulatory Visit: Payer: Self-pay | Admitting: Endocrinology

## 2016-11-28 NOTE — Progress Notes (Signed)
Subjective:    Patient ID: Destiny Flowers, female    DOB: 10/12/1929, 83 y.o.   MRN: [id number]  HPI Pt is here for regular wellness examination, and is feeling pretty well in general, and says chronic med probs are stable, except as noted below.  Past Medical History:  Diagnosis Date  . ALLERGIC RHINITIS   . DIABETES MELLITUS, TYPE II 06/06/2007  . DVT (deep venous thrombosis) (HCC)    Left leg  . Dysthymic disorder   . GERD 06/06/2007  . Homocysteinemia (HCC)   . HYPERCHOLESTEROLEMIA 02/19/2008  . HYPERTENSION 06/06/2007  . HYPOTHYROIDISM 06/06/2007  . INSOMNIA   . OBSTRUCTIVE SLEEP APNEA   . OSTEOARTHRITIS   . OSTEOPOROSIS 06/06/2007  . RENAL INSUFFICIENCY 06/06/2007    No past surgical history on file.  Social History   Social History  . Marital status: Single    Spouse name: N/A  . Number of children: N/A  . Years of education: N/A   Occupational History  . Not on file.   Social History Main Topics  . Smoking status: Passive Smoke Exposure - Never Smoker  . Smokeless tobacco: Not on file  . Alcohol use No  . Drug use: No  . Sexual activity: Not on file   Other Topics Concern  . Not on file   Social History Narrative  . No narrative on file    Current Outpatient Prescriptions on File Prior to Visit  Medication Sig Dispense Refill  . aspirin 81 MG tablet Take 81 mg by mouth daily.      . folic acid (FOLVITE) 800 MCG tablet Take 800 mcg by mouth daily.    . furosemide (LASIX) 20 MG tablet TAKE ONE TABLET BY MOUTH ONCE DAILY 90 tablet 1  . levothyroxine (SYNTHROID, LEVOTHROID) 100 MCG tablet TAKE ONE TABLET BY MOUTH ONCE DAILY BEFORE BREAKFAST 90 tablet 1  . losartan (COZAAR) 100 MG tablet Take 1 tablet (100 mg total) by mouth daily. 90 tablet 3  . metFORMIN (GLUCOPHAGE-XR) 500 MG 24 hr tablet TAKE FOUR TABLETS BY MOUTH ONCE DAILY IN THE MORNING 360 tablet 1  . Multiple Vitamin (MULTIVITAMIN) tablet Take 1 tablet by mouth daily.      Marland Kitchen. omeprazole (PRILOSEC OTC)  20 MG tablet Take 20 mg by mouth daily.    . traMADol-acetaminophen (ULTRACET) 37.5-325 MG per tablet Take 1 tablet by mouth as needed. For pain 30 tablet 2  . traZODone (DESYREL) 100 MG tablet Take 1 tablet (100 mg total) by mouth daily with breakfast. 30 tablet 11   No current facility-administered medications on file prior to visit.     Allergies  Allergen Reactions  . Januvia [Sitagliptin]     falls  . Penicillins Other (See Comments)    Unk    Family History  Problem Relation Age of Onset  . Stroke Mother   . Coronary artery disease Father     BP 132/84   Pulse 77   Ht 5' 4.5" (1.638 m)   Wt 195 lb (88.5 kg)   SpO2 94%   BMI 32.95 kg/m     Review of Systems Constitutional: Negative for fever.  HENT: denies otalgia  Eyes: Negative for eye itching.  Respiratory: Negative for shortness of breath.   Cardiovascular: Negative for chest pain.  Gastrointestinal: Negative for blood in stool.  Endocrine: Negative for cold intolerance.  Genitourinary: Negative for hematuria.  Musculoskeletal: Negative for back pain.  Skin: She has dry skin.  Allergic/Immunologic: Negative for  environmental allergies.  Neurological: Negative for syncope.  Hematological: Does not bruise/bleed easily.  Psychiatric/Behavioral: she has chronic insomnia.     Objective:   Physical Exam VS: see vs page GEN: no distress HEAD: head: no deformity eyes: no periorbital swelling, no proptosis external nose and ears are normal mouth: no lesion seen NECK: supple, thyroid is not enlarged CHEST WALL: no deformity LUNGS:  Clear to auscultation CV: reg rate and rhythm, no murmur.  ABD: abdomen is soft, nontender.  no hepatosplenomegaly.  not distended.  no hernia MUSCULOSKELETAL: muscle bulk and strength are grossly normal.  no obvious joint swelling.  gait is steady with a walker EXTEMITIES: no deformity.  no ulcer on the feet.  feet are of normal color and temp.  1+ left leg edema (trace on the  right).   PULSES: dorsalis pedis intact bilat.  no carotid bruit NEURO:  cn 2-12 grossly intact.   readily moves all 4's.  SKIN:  Normal texture and temperature.  No rash or suspicious lesion is visible.   NODES:  None palpable at the neck PSYCH: alert, well-oriented.  Does not appear anxious nor depressed.  I personally reviewed electrocardiogram tracing (today): Indication: DM Impression: NSR.  MI.  No hypertrophy. LAHB Compared to 2016: no change.     Assessment & Plan:  Wellness visit today, with problems stable, except as noted.  Subjective:   Patient here for Medicare annual wellness visit and management of other chronic and acute problems.     Risk factors: advanced age    Roster of Physicians Providing Medical Care to Patient:  See "snapshot"   Activities of Daily Living: In your present state of health, do you have any difficulty performing the following activities (lives alone)?:  Preparing food and eating?: No  Bathing yourself: No  Getting dressed: No  Using the toilet:No  Moving around from place to place: No  In the past year have you fallen or had a near fall?: No    Home Safety: Has smoke detector and wears seat belts. No firearms. No excess sun exposure.   Diet and Exercise  Current exercise habits: pt says limited by health problems Dietary issues discussed: pt reports intermittently healthy diet   Depression Screen  Q1: Over the past two weeks, have you felt down, depressed or hopeless? no  Q2: Over the past two weeks, have you felt little interest or pleasure in doing things? no   The following portions of the patient's history were reviewed and updated as appropriate: allergies, current medications, past family history, past medical history, past social history, past surgical history and problem list.  Past Medical History:  Diagnosis Date  . ALLERGIC RHINITIS   . DIABETES MELLITUS, TYPE II 06/06/2007  . DVT (deep venous thrombosis) (HCC)    Left  leg  . Dysthymic disorder   . GERD 06/06/2007  . Homocysteinemia (HCC)   . HYPERCHOLESTEROLEMIA 02/19/2008  . HYPERTENSION 06/06/2007  . HYPOTHYROIDISM 06/06/2007  . INSOMNIA   . OBSTRUCTIVE SLEEP APNEA   . OSTEOARTHRITIS   . OSTEOPOROSIS 06/06/2007  . RENAL INSUFFICIENCY 06/06/2007    No past surgical history on file.  Social History   Social History  . Marital status: Single    Spouse name: N/A  . Number of children: N/A  . Years of education: N/A   Occupational History  . Not on file.   Social History Main Topics  . Smoking status: Passive Smoke Exposure - Never Smoker  . Smokeless tobacco: Not  on file  . Alcohol use No  . Drug use: No  . Sexual activity: Not on file   Other Topics Concern  . Not on file   Social History Narrative  . No narrative on file    Current Outpatient Prescriptions on File Prior to Visit  Medication Sig Dispense Refill  . aspirin 81 MG tablet Take 81 mg by mouth daily.      . Cholecalciferol (VITAMIN D3) 1000 UNITS CAPS Take by mouth daily.      . folic acid (FOLVITE) 800 MCG tablet Take 800 mcg by mouth daily.    . furosemide (LASIX) 20 MG tablet TAKE ONE TABLET BY MOUTH ONCE DAILY 90 tablet 1  . levothyroxine (SYNTHROID, LEVOTHROID) 100 MCG tablet TAKE ONE TABLET BY MOUTH ONCE DAILY BEFORE BREAKFAST 90 tablet 1  . losartan (COZAAR) 100 MG tablet Take 1 tablet (100 mg total) by mouth daily. 90 tablet 3  . metFORMIN (GLUCOPHAGE-XR) 500 MG 24 hr tablet TAKE FOUR TABLETS BY MOUTH ONCE DAILY IN THE MORNING 360 tablet 1  . Multiple Vitamin (MULTIVITAMIN) tablet Take 1 tablet by mouth daily.      Marland Kitchen omeprazole (PRILOSEC OTC) 20 MG tablet Take 20 mg by mouth daily.    . traMADol-acetaminophen (ULTRACET) 37.5-325 MG per tablet Take 1 tablet by mouth as needed. For pain 30 tablet 2  . traZODone (DESYREL) 100 MG tablet Take 1 tablet (100 mg total) by mouth daily with breakfast. 30 tablet 11   No current facility-administered medications on file prior  to visit.     Allergies  Allergen Reactions  . Januvia [Sitagliptin]     falls  . Penicillins Other (See Comments)    Unk    Family History  Problem Relation Age of Onset  . Stroke Mother   . Coronary artery disease Father     BP 132/84   Pulse 77   Ht 5' 4.5" (1.638 m)   Wt 195 lb (88.5 kg)   SpO2 94%   BMI 32.95 kg/m   Review of Systems  No change in chronic hearing or visual loss.   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 Hearing: grossly normal Body mass index:  See vs page Msk: slowly performs "get-up-and-go" from a sitting position Cognitive Impairment Assessment: cognition, memory and judgment appear normal.  remembers 3/3 at 5 minutes.  excellent recall.  can easily read and write a sentence.  alert and oriented x 3.    Assessment:   Medicare wellness utd on preventive parameters    Plan:   During the course of the visit the patient was educated and counseled about appropriate screening and preventive services including:        Fall prevention   Screening mammography is UTD  Bone densitometry screening is UTD Diabetes screening  Nutrition counseling   Labs are ordered Vaccines are UTD  Patient Instructions (the written plan) was given to the patient.    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  No headache.  PAST MEDICAL HISTORY Past Medical History:  Diagnosis Date  . ALLERGIC RHINITIS   . DIABETES MELLITUS, TYPE II 06/06/2007  . DVT (deep venous thrombosis) (HCC)    Left leg  . Dysthymic disorder   . GERD 06/06/2007  . Homocysteinemia (HCC)   . HYPERCHOLESTEROLEMIA  02/19/2008  . HYPERTENSION 06/06/2007  . HYPOTHYROIDISM 06/06/2007  . INSOMNIA   . OBSTRUCTIVE SLEEP APNEA   . OSTEOARTHRITIS   . OSTEOPOROSIS 06/06/2007  . RENAL  INSUFFICIENCY 06/06/2007    No past surgical history on file.  Social History   Social History  . Marital status: Single    Spouse name: N/A  . Number of children: N/A  . Years of education: N/A   Occupational History  . Not on file.   Social History Main Topics  . Smoking status: Passive Smoke Exposure - Never Smoker  . Smokeless tobacco: Not on file  . Alcohol use No  . Drug use: No  . Sexual activity: Not on file   Other Topics Concern  . Not on file   Social History Narrative  . No narrative on file    Current Outpatient Prescriptions on File Prior to Visit  Medication Sig Dispense Refill  . aspirin 81 MG tablet Take 81 mg by mouth daily.      . folic acid (FOLVITE) 800 MCG tablet Take 800 mcg by mouth daily.    . furosemide (LASIX) 20 MG tablet TAKE ONE TABLET BY MOUTH ONCE DAILY 90 tablet 1  . levothyroxine (SYNTHROID, LEVOTHROID) 100 MCG tablet TAKE ONE TABLET BY MOUTH ONCE DAILY BEFORE BREAKFAST 90 tablet 1  . losartan (COZAAR) 100 MG tablet Take 1 tablet (100 mg total) by mouth daily. 90 tablet 3  . metFORMIN (GLUCOPHAGE-XR) 500 MG 24 hr tablet TAKE FOUR TABLETS BY MOUTH ONCE DAILY IN THE MORNING 360 tablet 1  . Multiple Vitamin (MULTIVITAMIN) tablet Take 1 tablet by mouth daily.      Marland Kitchen omeprazole (PRILOSEC OTC) 20 MG tablet Take 20 mg by mouth daily.    . traMADol-acetaminophen (ULTRACET) 37.5-325 MG per tablet Take 1 tablet by mouth as needed. For pain 30 tablet 2  . traZODone (DESYREL) 100 MG tablet Take 1 tablet (100 mg total) by mouth daily with breakfast. 30 tablet 11   No current facility-administered medications on file prior to visit.     Allergies  Allergen Reactions  . Januvia [Sitagliptin]     falls  . Penicillins Other (See Comments)    Unk    Family History  Problem Relation Age of Onset  . Stroke Mother   . Coronary artery disease Father     BP 132/84   Pulse 77   Ht 5' 4.5" (1.638 m)   Wt 195 lb (88.5 kg)   SpO2 94%   BMI 32.95  kg/m   REVIEW OF SYSTEMS: Denies weight change PHYSICAL EXAMINATION: VITAL SIGNS:  See vs page GENERAL: no distress Neuro: sensation is intact to touch on the feet LAB/XRAY RESULTS: Lab Results  Component Value Date   LABURIC 7.4 (H) 11/29/2016   Vit-D=65 IMPRESSION: Hyperuricemia: this accelerates renal insuff Vit-D deficiency: overreplaced PLAN:  D/c vit-D I rx'ed allopurinol.

## 2016-11-29 ENCOUNTER — Ambulatory Visit (INDEPENDENT_AMBULATORY_CARE_PROVIDER_SITE_OTHER): Payer: PPO | Admitting: Endocrinology

## 2016-11-29 ENCOUNTER — Telehealth: Payer: Self-pay | Admitting: Endocrinology

## 2016-11-29 ENCOUNTER — Encounter: Payer: Self-pay | Admitting: Endocrinology

## 2016-11-29 VITALS — BP 132/84 | HR 77 | Ht 64.5 in | Wt 195.0 lb

## 2016-11-29 DIAGNOSIS — E039 Hypothyroidism, unspecified: Secondary | ICD-10-CM

## 2016-11-29 DIAGNOSIS — D649 Anemia, unspecified: Secondary | ICD-10-CM

## 2016-11-29 DIAGNOSIS — E559 Vitamin D deficiency, unspecified: Secondary | ICD-10-CM | POA: Diagnosis not present

## 2016-11-29 DIAGNOSIS — M1A9XX0 Chronic gout, unspecified, without tophus (tophi): Secondary | ICD-10-CM | POA: Diagnosis not present

## 2016-11-29 DIAGNOSIS — N259 Disorder resulting from impaired renal tubular function, unspecified: Secondary | ICD-10-CM

## 2016-11-29 DIAGNOSIS — E1122 Type 2 diabetes mellitus with diabetic chronic kidney disease: Secondary | ICD-10-CM

## 2016-11-29 DIAGNOSIS — Z0000 Encounter for general adult medical examination without abnormal findings: Secondary | ICD-10-CM | POA: Diagnosis not present

## 2016-11-29 LAB — URINALYSIS, ROUTINE W REFLEX MICROSCOPIC
Bilirubin Urine: NEGATIVE
Hgb urine dipstick: NEGATIVE
Ketones, ur: NEGATIVE
Nitrite: NEGATIVE
RBC / HPF: NONE SEEN (ref 0–?)
SPECIFIC GRAVITY, URINE: 1.02 (ref 1.000–1.030)
Total Protein, Urine: NEGATIVE
URINE GLUCOSE: NEGATIVE
Urobilinogen, UA: 0.2 (ref 0.0–1.0)
pH: 5.5 (ref 5.0–8.0)

## 2016-11-29 LAB — CBC WITH DIFFERENTIAL/PLATELET
BASOS PCT: 0.3 % (ref 0.0–3.0)
Basophils Absolute: 0 10*3/uL (ref 0.0–0.1)
EOS ABS: 0.2 10*3/uL (ref 0.0–0.7)
Eosinophils Relative: 2.2 % (ref 0.0–5.0)
HEMATOCRIT: 36.7 % (ref 36.0–46.0)
Hemoglobin: 12.4 g/dL (ref 12.0–15.0)
LYMPHS PCT: 17.7 % (ref 12.0–46.0)
Lymphs Abs: 1.3 10*3/uL (ref 0.7–4.0)
MCHC: 33.8 g/dL (ref 30.0–36.0)
MCV: 89.8 fl (ref 78.0–100.0)
Monocytes Absolute: 0.7 10*3/uL (ref 0.1–1.0)
Monocytes Relative: 9.5 % (ref 3.0–12.0)
NEUTROS ABS: 5.2 10*3/uL (ref 1.4–7.7)
Neutrophils Relative %: 70.3 % (ref 43.0–77.0)
PLATELETS: 257 10*3/uL (ref 150.0–400.0)
RBC: 4.09 Mil/uL (ref 3.87–5.11)
RDW: 13.1 % (ref 11.5–15.5)
WBC: 7.4 10*3/uL (ref 4.0–10.5)

## 2016-11-29 LAB — BASIC METABOLIC PANEL
BUN: 28 mg/dL — AB (ref 6–23)
CHLORIDE: 101 meq/L (ref 96–112)
CO2: 28 mEq/L (ref 19–32)
Calcium: 9.6 mg/dL (ref 8.4–10.5)
Creatinine, Ser: 1.11 mg/dL (ref 0.40–1.20)
GFR: 49.58 mL/min — ABNORMAL LOW (ref >60.00)
GLUCOSE: 90 mg/dL (ref 70–99)
POTASSIUM: 4 meq/L (ref 3.5–5.1)
SODIUM: 138 meq/L (ref 135–145)

## 2016-11-29 LAB — IBC PANEL
IRON: 118 ug/dL (ref 42–145)
SATURATION RATIOS: 26.2 % (ref 20.0–50.0)
TRANSFERRIN: 322 mg/dL (ref 212.0–360.0)

## 2016-11-29 LAB — HEPATIC FUNCTION PANEL
ALT: 9 U/L (ref 0–35)
AST: 15 U/L (ref 0–37)
Albumin: 4 g/dL (ref 3.5–5.2)
Alkaline Phosphatase: 33 U/L — ABNORMAL LOW (ref 39–117)
BILIRUBIN DIRECT: 0.1 mg/dL (ref 0.0–0.3)
BILIRUBIN TOTAL: 0.3 mg/dL (ref 0.2–1.2)
Total Protein: 6.8 g/dL (ref 6.0–8.3)

## 2016-11-29 LAB — LIPID PANEL
CHOLESTEROL: 155 mg/dL (ref 0–200)
HDL: 49.6 mg/dL (ref >39.00)
LDL CALC: 80 mg/dL (ref 0–99)
NonHDL: 105.77
TRIGLYCERIDES: 128 mg/dL (ref 0.0–149.0)
Total CHOL/HDL Ratio: 3
VLDL: 25.6 mg/dL (ref 0.0–40.0)

## 2016-11-29 LAB — MICROALBUMIN / CREATININE URINE RATIO
Creatinine,U: 140.6 mg/dL
Microalb Creat Ratio: 0.6 mg/g (ref 0.0–30.0)
Microalb, Ur: 0.8 mg/dL (ref 0.0–1.9)

## 2016-11-29 LAB — VITAMIN D 25 HYDROXY (VIT D DEFICIENCY, FRACTURES): VITD: 64.84 ng/mL (ref 30.00–100.00)

## 2016-11-29 LAB — URIC ACID: Uric Acid, Serum: 7.4 mg/dL — ABNORMAL HIGH (ref 2.4–7.0)

## 2016-11-29 LAB — HEMOGLOBIN A1C: Hgb A1c MFr Bld: 6.1 % (ref 4.6–6.5)

## 2016-11-29 LAB — TSH: TSH: 0.84 u[IU]/mL (ref 0.35–4.50)

## 2016-11-29 MED ORDER — ALLOPURINOL 100 MG PO TABS: 100.0000 mg | ORAL_TABLET | Freq: Every day | ORAL | 6 refills | Status: DC

## 2016-11-29 NOTE — Patient Instructions (Addendum)
blood tests are requested for you today.  We'll let you know about the results. Please consider these measures for your health:  minimize alcohol.  Do not use tobacco products.  Have a colonoscopy at least every 10 years from age 81.  Women should have an annual mammogram from age 81.  Keep firearms safely stored.  Always use seat belts.  have working smoke alarms in your home.  See an eye doctor and dentist regularly.  Never drive under the influence of alcohol or drugs (including prescription drugs).  Those with fair skin should take precautions against the sun, and should carefully examine their skin once per month, for any new or changed moles. It is critically important to prevent falling down (keep floor areas well-lit, dry, and free of loose objects.  If you have a cane, walker, or wheelchair, you should use it, even for short trips around the house.  Wear flat-soled shoes.  Also, try not to rush). good diet and exercise significantly improve your health.  please let me know if you wish to be referred to a dietician.  high blood sugar is very risky to your health.  you should see an eye doctor and dentist every year.  It is very important to get all recommended vaccinations.  Please come back for a follow-up appointment in 6 months

## 2016-11-29 NOTE — Progress Notes (Signed)
we discussed code status.  pt requests DNR 

## 2016-11-29 NOTE — Telephone Encounter (Signed)
please call patient: My apologies.   Your BP was normal

## 2016-11-29 NOTE — Telephone Encounter (Signed)
Patient was notified of this before she left her office visit. Patient voiced understanding.

## 2016-11-30 LAB — PTH, INTACT AND CALCIUM
Calcium: 9.5 mg/dL (ref 8.6–10.4)
PTH: 40 pg/mL (ref 14–64)

## 2016-12-13 ENCOUNTER — Telehealth: Payer: Self-pay | Admitting: Endocrinology

## 2016-12-13 ENCOUNTER — Other Ambulatory Visit: Payer: Self-pay

## 2016-12-13 MED ORDER — ALLOPURINOL 100 MG PO TABS: 100.0000 mg | ORAL_TABLET | Freq: Every day | ORAL | 6 refills | Status: DC

## 2016-12-13 NOTE — Telephone Encounter (Signed)
I contacted the patient and advised of lab results from 11/29/2016. please call patient:  Gout blood test is slightly high. I have sent a prescription to your pharmacy, for this. Taking this medication helps to preserve your kidney function.  Your vitamin-D is high-normal. You can stop taking the vitamin-D pill.  I'll see you next time.   Patient voiced understanding and had no further questions at this time.

## 2016-12-13 NOTE — Telephone Encounter (Signed)
Patient ask you to give her a call °

## 2017-03-06 ENCOUNTER — Telehealth: Payer: Self-pay | Admitting: Endocrinology

## 2017-03-06 NOTE — Telephone Encounter (Signed)
See message and please advise, Thanks!  

## 2017-03-06 NOTE — Telephone Encounter (Signed)
Pt is having lower back and hip pain and is asking for an xray please  Her chiropractor is asking for them to be done   Daughter is to call back

## 2017-03-06 NOTE — Telephone Encounter (Signed)
please call patient: We have received message from your friend.  Please let us know how we can help you.

## 2017-03-06 NOTE — Telephone Encounter (Signed)
I contacted the patient. She stated she is in need of having x-rays done on her back. Patient advised we would need to see her before we could place the orders. Pateint scheduled for 945 tomorrow.

## 2017-03-06 NOTE — Telephone Encounter (Signed)
Patient friend is calling for patient stating patient Dr Titus Mould is need her to have some surgery concerning her cervical spine. Please advise  Britt Boozer [phone]

## 2017-03-07 ENCOUNTER — Ambulatory Visit
Admission: RE | Admit: 2017-03-07 | Discharge: 2017-03-07 | Disposition: A | Discharge status: Home / Self Care | Payer: PPO | Source: Ambulatory Visit | Attending: Endocrinology | Admitting: Endocrinology

## 2017-03-07 ENCOUNTER — Encounter: Payer: Self-pay | Admitting: Endocrinology

## 2017-03-07 ENCOUNTER — Ambulatory Visit (INDEPENDENT_AMBULATORY_CARE_PROVIDER_SITE_OTHER): Payer: PPO | Admitting: Endocrinology

## 2017-03-07 ENCOUNTER — Other Ambulatory Visit: Payer: Self-pay | Admitting: Endocrinology

## 2017-03-07 DIAGNOSIS — S299XXA Unspecified injury of thorax, initial encounter: Secondary | ICD-10-CM | POA: Diagnosis not present

## 2017-03-07 DIAGNOSIS — G8929 Other chronic pain: Secondary | ICD-10-CM

## 2017-03-07 DIAGNOSIS — S199XXA Unspecified injury of neck, initial encounter: Secondary | ICD-10-CM | POA: Diagnosis not present

## 2017-03-07 DIAGNOSIS — M542 Cervicalgia: Secondary | ICD-10-CM

## 2017-03-07 DIAGNOSIS — M549 Dorsalgia, unspecified: Secondary | ICD-10-CM | POA: Insufficient documentation

## 2017-03-07 DIAGNOSIS — I6521 Occlusion and stenosis of right carotid artery: Secondary | ICD-10-CM | POA: Insufficient documentation

## 2017-03-07 DIAGNOSIS — M546 Pain in thoracic spine: Secondary | ICD-10-CM | POA: Diagnosis not present

## 2017-03-07 MED ORDER — ALLOPURINOL 100 MG PO TABS: 100.0000 mg | ORAL_TABLET | Freq: Every day | ORAL | 6 refills | Status: DC

## 2017-03-07 NOTE — Patient Instructions (Addendum)
X-rays are requested for you today.  We'll let you know about the results. I have sent a prescription to your pharmacy, for the allopurinol. I'll see you next time.

## 2017-03-07 NOTE — Progress Notes (Signed)
Subjective:    Patient ID: Destiny Flowers, female    DOB: 27-Dec-1928, 83 y.o.   MRN: [id number]  HPI Pt states few months of moderate pain at the neck, but ho assoc numbness.  This started with a fall.  She does not take allopurinol, due to improvement in sxs.   Past Medical History:  Diagnosis Date  . ALLERGIC RHINITIS   . DIABETES MELLITUS, TYPE II 06/06/2007  . DVT (deep venous thrombosis) (HCC)    Left leg  . Dysthymic disorder   . GERD 06/06/2007  . Homocysteinemia (HCC)   . HYPERCHOLESTEROLEMIA 02/19/2008  . HYPERTENSION 06/06/2007  . HYPOTHYROIDISM 06/06/2007  . INSOMNIA   . OBSTRUCTIVE SLEEP APNEA   . OSTEOARTHRITIS   . OSTEOPOROSIS 06/06/2007  . RENAL INSUFFICIENCY 06/06/2007    No past surgical history on file.  Social History   Social History  . Marital status: Single    Spouse name: N/A  . Number of children: N/A  . Years of education: N/A   Occupational History  . Not on file.   Social History Main Topics  . Smoking status: Passive Smoke Exposure - Never Smoker  . Smokeless tobacco: Never Used  . Alcohol use No  . Drug use: No  . Sexual activity: Not on file   Other Topics Concern  . Not on file   Social History Narrative  . No narrative on file    Current Outpatient Prescriptions on File Prior to Visit  Medication Sig Dispense Refill  . aspirin 81 MG tablet Take 81 mg by mouth daily.      . folic acid (FOLVITE) 800 MCG tablet Take 800 mcg by mouth daily.    . furosemide (LASIX) 20 MG tablet TAKE ONE TABLET BY MOUTH ONCE DAILY 90 tablet 1  . levothyroxine (SYNTHROID, LEVOTHROID) 100 MCG tablet TAKE ONE TABLET BY MOUTH ONCE DAILY BEFORE BREAKFAST 90 tablet 1  . losartan (COZAAR) 100 MG tablet Take 1 tablet (100 mg total) by mouth daily. 90 tablet 3  . metFORMIN (GLUCOPHAGE-XR) 500 MG 24 hr tablet TAKE FOUR TABLETS BY MOUTH ONCE DAILY IN THE MORNING 360 tablet 1  . Multiple Vitamin (MULTIVITAMIN) tablet Take 1 tablet by mouth daily.      Marland Kitchen omeprazole  (PRILOSEC OTC) 20 MG tablet Take 20 mg by mouth daily.    . traMADol-acetaminophen (ULTRACET) 37.5-325 MG per tablet Take 1 tablet by mouth as needed. For pain 30 tablet 2  . traZODone (DESYREL) 100 MG tablet Take 1 tablet (100 mg total) by mouth daily with breakfast. 30 tablet 11   No current facility-administered medications on file prior to visit.     Allergies  Allergen Reactions  . Januvia [Sitagliptin]     falls  . Penicillins Other (See Comments)    Unk    Family History  Problem Relation Age of Onset  . Stroke Mother   . Coronary artery disease Father     BP 136/84   Pulse 71   Ht 5' 4.5" (1.638 m)   Wt 191 lb (86.6 kg)   SpO2 94%   BMI 32.28 kg/m    Review of Systems She also has pain at the thoracic and lumbar midline vertebral areas.  Denies bowel or bladder retention.      Objective:   Physical Exam VITAL SIGNS:  See vs page GENERAL: no distress.  Neck: full ROM, but ROM is painful.   Spine: nontender Gait: steady with a cane.  C-spine MRI (2008): multiple MSK abnormalities.     Assessment & Plan:  Back and neck pain, recurrent.  She declines rx, as she wants to see her chiropractor Renal insuff: this limits pain rx options.   Hyperuricemia.  therapy limited by noncompliance.    Patient Instructions  X-rays are requested for you today.  We'll let you know about the results. I have sent a prescription to your pharmacy, for the allopurinol. I'll see you next time.

## 2017-03-08 DIAGNOSIS — M9903 Segmental and somatic dysfunction of lumbar region: Secondary | ICD-10-CM | POA: Diagnosis not present

## 2017-03-08 DIAGNOSIS — M5136 Other intervertebral disc degeneration, lumbar region: Secondary | ICD-10-CM | POA: Diagnosis not present

## 2017-03-08 DIAGNOSIS — M5134 Other intervertebral disc degeneration, thoracic region: Secondary | ICD-10-CM | POA: Diagnosis not present

## 2017-03-08 DIAGNOSIS — M9902 Segmental and somatic dysfunction of thoracic region: Secondary | ICD-10-CM | POA: Diagnosis not present

## 2017-03-08 DIAGNOSIS — M5032 Other cervical disc degeneration, mid-cervical region, unspecified level: Secondary | ICD-10-CM | POA: Diagnosis not present

## 2017-03-08 DIAGNOSIS — M9901 Segmental and somatic dysfunction of cervical region: Secondary | ICD-10-CM | POA: Diagnosis not present

## 2017-03-10 DIAGNOSIS — M5032 Other cervical disc degeneration, mid-cervical region, unspecified level: Secondary | ICD-10-CM | POA: Diagnosis not present

## 2017-03-10 DIAGNOSIS — M9903 Segmental and somatic dysfunction of lumbar region: Secondary | ICD-10-CM | POA: Diagnosis not present

## 2017-03-10 DIAGNOSIS — M5136 Other intervertebral disc degeneration, lumbar region: Secondary | ICD-10-CM | POA: Diagnosis not present

## 2017-03-10 DIAGNOSIS — M9901 Segmental and somatic dysfunction of cervical region: Secondary | ICD-10-CM | POA: Diagnosis not present

## 2017-03-10 DIAGNOSIS — M9902 Segmental and somatic dysfunction of thoracic region: Secondary | ICD-10-CM | POA: Diagnosis not present

## 2017-03-10 DIAGNOSIS — M5134 Other intervertebral disc degeneration, thoracic region: Secondary | ICD-10-CM | POA: Diagnosis not present

## 2017-03-13 DIAGNOSIS — M5136 Other intervertebral disc degeneration, lumbar region: Secondary | ICD-10-CM | POA: Diagnosis not present

## 2017-03-13 DIAGNOSIS — M9901 Segmental and somatic dysfunction of cervical region: Secondary | ICD-10-CM | POA: Diagnosis not present

## 2017-03-13 DIAGNOSIS — M9903 Segmental and somatic dysfunction of lumbar region: Secondary | ICD-10-CM | POA: Diagnosis not present

## 2017-03-13 DIAGNOSIS — M5032 Other cervical disc degeneration, mid-cervical region, unspecified level: Secondary | ICD-10-CM | POA: Diagnosis not present

## 2017-03-13 DIAGNOSIS — M5134 Other intervertebral disc degeneration, thoracic region: Secondary | ICD-10-CM | POA: Diagnosis not present

## 2017-03-13 DIAGNOSIS — M9902 Segmental and somatic dysfunction of thoracic region: Secondary | ICD-10-CM | POA: Diagnosis not present

## 2017-03-17 DIAGNOSIS — M9901 Segmental and somatic dysfunction of cervical region: Secondary | ICD-10-CM | POA: Diagnosis not present

## 2017-03-17 DIAGNOSIS — M5032 Other cervical disc degeneration, mid-cervical region, unspecified level: Secondary | ICD-10-CM | POA: Diagnosis not present

## 2017-03-17 DIAGNOSIS — M5136 Other intervertebral disc degeneration, lumbar region: Secondary | ICD-10-CM | POA: Diagnosis not present

## 2017-03-17 DIAGNOSIS — M9902 Segmental and somatic dysfunction of thoracic region: Secondary | ICD-10-CM | POA: Diagnosis not present

## 2017-03-17 DIAGNOSIS — M5134 Other intervertebral disc degeneration, thoracic region: Secondary | ICD-10-CM | POA: Diagnosis not present

## 2017-03-17 DIAGNOSIS — M9903 Segmental and somatic dysfunction of lumbar region: Secondary | ICD-10-CM | POA: Diagnosis not present

## 2017-03-29 DIAGNOSIS — M9902 Segmental and somatic dysfunction of thoracic region: Secondary | ICD-10-CM | POA: Diagnosis not present

## 2017-03-29 DIAGNOSIS — M9903 Segmental and somatic dysfunction of lumbar region: Secondary | ICD-10-CM | POA: Diagnosis not present

## 2017-03-29 DIAGNOSIS — M5032 Other cervical disc degeneration, mid-cervical region, unspecified level: Secondary | ICD-10-CM | POA: Diagnosis not present

## 2017-03-29 DIAGNOSIS — M5134 Other intervertebral disc degeneration, thoracic region: Secondary | ICD-10-CM | POA: Diagnosis not present

## 2017-03-29 DIAGNOSIS — M9901 Segmental and somatic dysfunction of cervical region: Secondary | ICD-10-CM | POA: Diagnosis not present

## 2017-03-29 DIAGNOSIS — M5136 Other intervertebral disc degeneration, lumbar region: Secondary | ICD-10-CM | POA: Diagnosis not present

## 2017-04-03 DIAGNOSIS — M5136 Other intervertebral disc degeneration, lumbar region: Secondary | ICD-10-CM | POA: Diagnosis not present

## 2017-04-03 DIAGNOSIS — M5032 Other cervical disc degeneration, mid-cervical region, unspecified level: Secondary | ICD-10-CM | POA: Diagnosis not present

## 2017-04-03 DIAGNOSIS — M5134 Other intervertebral disc degeneration, thoracic region: Secondary | ICD-10-CM | POA: Diagnosis not present

## 2017-04-03 DIAGNOSIS — M9901 Segmental and somatic dysfunction of cervical region: Secondary | ICD-10-CM | POA: Diagnosis not present

## 2017-04-03 DIAGNOSIS — M9902 Segmental and somatic dysfunction of thoracic region: Secondary | ICD-10-CM | POA: Diagnosis not present

## 2017-04-03 DIAGNOSIS — M9903 Segmental and somatic dysfunction of lumbar region: Secondary | ICD-10-CM | POA: Diagnosis not present

## 2017-04-07 DIAGNOSIS — M9902 Segmental and somatic dysfunction of thoracic region: Secondary | ICD-10-CM | POA: Diagnosis not present

## 2017-04-07 DIAGNOSIS — M5136 Other intervertebral disc degeneration, lumbar region: Secondary | ICD-10-CM | POA: Diagnosis not present

## 2017-04-07 DIAGNOSIS — M5032 Other cervical disc degeneration, mid-cervical region, unspecified level: Secondary | ICD-10-CM | POA: Diagnosis not present

## 2017-04-07 DIAGNOSIS — M9903 Segmental and somatic dysfunction of lumbar region: Secondary | ICD-10-CM | POA: Diagnosis not present

## 2017-04-07 DIAGNOSIS — M9901 Segmental and somatic dysfunction of cervical region: Secondary | ICD-10-CM | POA: Diagnosis not present

## 2017-04-07 DIAGNOSIS — M5134 Other intervertebral disc degeneration, thoracic region: Secondary | ICD-10-CM | POA: Diagnosis not present

## 2017-04-17 DIAGNOSIS — M5136 Other intervertebral disc degeneration, lumbar region: Secondary | ICD-10-CM | POA: Diagnosis not present

## 2017-04-17 DIAGNOSIS — M5134 Other intervertebral disc degeneration, thoracic region: Secondary | ICD-10-CM | POA: Diagnosis not present

## 2017-04-17 DIAGNOSIS — M9903 Segmental and somatic dysfunction of lumbar region: Secondary | ICD-10-CM | POA: Diagnosis not present

## 2017-04-17 DIAGNOSIS — M9901 Segmental and somatic dysfunction of cervical region: Secondary | ICD-10-CM | POA: Diagnosis not present

## 2017-04-17 DIAGNOSIS — M9902 Segmental and somatic dysfunction of thoracic region: Secondary | ICD-10-CM | POA: Diagnosis not present

## 2017-04-17 DIAGNOSIS — M5032 Other cervical disc degeneration, mid-cervical region, unspecified level: Secondary | ICD-10-CM | POA: Diagnosis not present

## 2017-04-19 ENCOUNTER — Other Ambulatory Visit: Payer: Self-pay | Admitting: Endocrinology

## 2017-04-20 DIAGNOSIS — M5134 Other intervertebral disc degeneration, thoracic region: Secondary | ICD-10-CM | POA: Diagnosis not present

## 2017-04-20 DIAGNOSIS — M9902 Segmental and somatic dysfunction of thoracic region: Secondary | ICD-10-CM | POA: Diagnosis not present

## 2017-04-20 DIAGNOSIS — M9903 Segmental and somatic dysfunction of lumbar region: Secondary | ICD-10-CM | POA: Diagnosis not present

## 2017-04-20 DIAGNOSIS — M5032 Other cervical disc degeneration, mid-cervical region, unspecified level: Secondary | ICD-10-CM | POA: Diagnosis not present

## 2017-04-20 DIAGNOSIS — M5136 Other intervertebral disc degeneration, lumbar region: Secondary | ICD-10-CM | POA: Diagnosis not present

## 2017-04-20 DIAGNOSIS — M9901 Segmental and somatic dysfunction of cervical region: Secondary | ICD-10-CM | POA: Diagnosis not present

## 2017-04-26 DIAGNOSIS — M9903 Segmental and somatic dysfunction of lumbar region: Secondary | ICD-10-CM | POA: Diagnosis not present

## 2017-04-26 DIAGNOSIS — M9901 Segmental and somatic dysfunction of cervical region: Secondary | ICD-10-CM | POA: Diagnosis not present

## 2017-04-26 DIAGNOSIS — M5032 Other cervical disc degeneration, mid-cervical region, unspecified level: Secondary | ICD-10-CM | POA: Diagnosis not present

## 2017-04-26 DIAGNOSIS — M9902 Segmental and somatic dysfunction of thoracic region: Secondary | ICD-10-CM | POA: Diagnosis not present

## 2017-04-26 DIAGNOSIS — M5134 Other intervertebral disc degeneration, thoracic region: Secondary | ICD-10-CM | POA: Diagnosis not present

## 2017-04-26 DIAGNOSIS — M5136 Other intervertebral disc degeneration, lumbar region: Secondary | ICD-10-CM | POA: Diagnosis not present

## 2017-04-28 ENCOUNTER — Other Ambulatory Visit: Payer: Self-pay | Admitting: Endocrinology

## 2017-05-01 DIAGNOSIS — M5136 Other intervertebral disc degeneration, lumbar region: Secondary | ICD-10-CM | POA: Diagnosis not present

## 2017-05-01 DIAGNOSIS — M9903 Segmental and somatic dysfunction of lumbar region: Secondary | ICD-10-CM | POA: Diagnosis not present

## 2017-05-01 DIAGNOSIS — M9901 Segmental and somatic dysfunction of cervical region: Secondary | ICD-10-CM | POA: Diagnosis not present

## 2017-05-01 DIAGNOSIS — M9902 Segmental and somatic dysfunction of thoracic region: Secondary | ICD-10-CM | POA: Diagnosis not present

## 2017-05-01 DIAGNOSIS — M5032 Other cervical disc degeneration, mid-cervical region, unspecified level: Secondary | ICD-10-CM | POA: Diagnosis not present

## 2017-05-01 DIAGNOSIS — M5134 Other intervertebral disc degeneration, thoracic region: Secondary | ICD-10-CM | POA: Diagnosis not present

## 2017-05-08 DIAGNOSIS — M9903 Segmental and somatic dysfunction of lumbar region: Secondary | ICD-10-CM | POA: Diagnosis not present

## 2017-05-08 DIAGNOSIS — M5032 Other cervical disc degeneration, mid-cervical region, unspecified level: Secondary | ICD-10-CM | POA: Diagnosis not present

## 2017-05-08 DIAGNOSIS — M5136 Other intervertebral disc degeneration, lumbar region: Secondary | ICD-10-CM | POA: Diagnosis not present

## 2017-05-08 DIAGNOSIS — M5134 Other intervertebral disc degeneration, thoracic region: Secondary | ICD-10-CM | POA: Diagnosis not present

## 2017-05-08 DIAGNOSIS — M9902 Segmental and somatic dysfunction of thoracic region: Secondary | ICD-10-CM | POA: Diagnosis not present

## 2017-05-08 DIAGNOSIS — M9901 Segmental and somatic dysfunction of cervical region: Secondary | ICD-10-CM | POA: Diagnosis not present

## 2017-05-15 DIAGNOSIS — M5134 Other intervertebral disc degeneration, thoracic region: Secondary | ICD-10-CM | POA: Diagnosis not present

## 2017-05-15 DIAGNOSIS — M5032 Other cervical disc degeneration, mid-cervical region, unspecified level: Secondary | ICD-10-CM | POA: Diagnosis not present

## 2017-05-15 DIAGNOSIS — M5136 Other intervertebral disc degeneration, lumbar region: Secondary | ICD-10-CM | POA: Diagnosis not present

## 2017-05-15 DIAGNOSIS — M9903 Segmental and somatic dysfunction of lumbar region: Secondary | ICD-10-CM | POA: Diagnosis not present

## 2017-05-15 DIAGNOSIS — M9902 Segmental and somatic dysfunction of thoracic region: Secondary | ICD-10-CM | POA: Diagnosis not present

## 2017-05-15 DIAGNOSIS — M9901 Segmental and somatic dysfunction of cervical region: Secondary | ICD-10-CM | POA: Diagnosis not present

## 2017-05-29 ENCOUNTER — Encounter: Payer: Self-pay | Admitting: Endocrinology

## 2017-05-29 ENCOUNTER — Ambulatory Visit (INDEPENDENT_AMBULATORY_CARE_PROVIDER_SITE_OTHER): Payer: PPO | Admitting: Endocrinology

## 2017-05-29 VITALS — BP 132/80 | HR 79 | Ht 64.0 in | Wt 191.0 lb

## 2017-05-29 DIAGNOSIS — G8929 Other chronic pain: Secondary | ICD-10-CM | POA: Diagnosis not present

## 2017-05-29 DIAGNOSIS — M549 Dorsalgia, unspecified: Secondary | ICD-10-CM | POA: Diagnosis not present

## 2017-05-29 DIAGNOSIS — E1122 Type 2 diabetes mellitus with diabetic chronic kidney disease: Secondary | ICD-10-CM

## 2017-05-29 LAB — POCT GLYCOSYLATED HEMOGLOBIN (HGB A1C): Hemoglobin A1C: 6.3

## 2017-05-29 MED ORDER — GLUCOSE BLOOD VI STRP: 1.0000 | ORAL_STRIP | Freq: Every day | 12 refills | Status: AC

## 2017-05-29 NOTE — Progress Notes (Signed)
Subjective:    Patient ID: Destiny Flowers, female    DOB: Sep 27, 1929, 83 y.o.   MRN: [id number]  HPI Pt returns for f/u of diabetes mellitus: DM type: 2 Dx'ed: 2007 Complications: renal insuff Therapy: metformin GDM: never DKA: never Severe hypoglycemia: never Pancreatitis: never Pancreatic imaging: normal in 2013 CT Other: she has never been on insulin.  Interval history: she has lost a few lbs.  She does not check cbg's.   Past Medical History:  Diagnosis Date  . ALLERGIC RHINITIS   . DIABETES MELLITUS, TYPE II 06/06/2007  . DVT (deep venous thrombosis) (HCC)    Left leg  . Dysthymic disorder   . GERD 06/06/2007  . Homocysteinemia (HCC)   . HYPERCHOLESTEROLEMIA 02/19/2008  . HYPERTENSION 06/06/2007  . HYPOTHYROIDISM 06/06/2007  . INSOMNIA   . OBSTRUCTIVE SLEEP APNEA   . OSTEOARTHRITIS   . OSTEOPOROSIS 06/06/2007  . RENAL INSUFFICIENCY 06/06/2007    No past surgical history on file.  Social History   Social History  . Marital status: Single    Spouse name: N/A  . Number of children: N/A  . Years of education: N/A   Occupational History  . Not on file.   Social History Main Topics  . Smoking status: Passive Smoke Exposure - Never Smoker  . Smokeless tobacco: Never Used  . Alcohol use No  . Drug use: No  . Sexual activity: Not on file   Other Topics Concern  . Not on file   Social History Narrative  . No narrative on file    Current Outpatient Prescriptions on File Prior to Visit  Medication Sig Dispense Refill  . allopurinol (ZYLOPRIM) 100 MG tablet Take 1 tablet (100 mg total) by mouth daily. 30 tablet 6  . aspirin 81 MG tablet Take 81 mg by mouth daily.      . folic acid (FOLVITE) 800 MCG tablet Take 800 mcg by mouth daily.    . furosemide (LASIX) 20 MG tablet TAKE ONE TABLET BY MOUTH ONCE DAILY 90 tablet 1  . levothyroxine (SYNTHROID, LEVOTHROID) 100 MCG tablet TAKE ONE TABLET BY MOUTH ONCE DAILY BEFORE BREAKFAST 90 tablet 1  . losartan (COZAAR) 100  MG tablet TAKE ONE TABLET BY MOUTH ONCE DAILY 90 tablet 3  . metFORMIN (GLUCOPHAGE-XR) 500 MG 24 hr tablet TAKE FOUR TABLETS BY MOUTH ONCE DAILY IN THE MORNING 360 tablet 1  . Multiple Vitamin (MULTIVITAMIN) tablet Take 1 tablet by mouth daily.      Marland Kitchen omeprazole (PRILOSEC OTC) 20 MG tablet Take 20 mg by mouth daily.    . traMADol-acetaminophen (ULTRACET) 37.5-325 MG per tablet Take 1 tablet by mouth as needed. For pain 30 tablet 2  . traZODone (DESYREL) 100 MG tablet Take 1 tablet (100 mg total) by mouth daily with breakfast. 30 tablet 11   No current facility-administered medications on file prior to visit.     Allergies  Allergen Reactions  . Januvia [Sitagliptin]     falls  . Penicillins Other (See Comments)    Unk    Family History  Problem Relation Age of Onset  . Stroke Mother   . Coronary artery disease Father     BP 132/80   Pulse 79   Ht 5\' 4"  (1.626 m)   Wt 191 lb (86.6 kg)   SpO2 95%   BMI 32.79 kg/m    Review of Systems Low back pain persists.     Objective:   Physical Exam VITAL SIGNS:  See  vs page GENERAL: no distress Pulses: foot pulses are intact bilaterally.   MSK: no deformity of the feet or ankles.  CV: trace bilat edema of the legs, but none on the ankles.   Skin:  no ulcer on the feet or ankles.  normal color and temp on the feet and ankles Neuro: sensation is intact to touch on the feet and ankles.     A1c=6.3%     Assessment & Plan:  Type 2 DM, with renal insuff: well-controlled. Back pain, persistent.    Patient Instructions  Please continue the same metformin. Please [place] therapy [place].  you will receive a phone call, about a day and time for an appointment.  Here is a new meter.  I have sent a prescription to your pharmacy, for strips.   check your blood sugar once a week.  vary the time of day when you check, between before the 3 meals, and at bedtime.  also check if you have symptoms of your blood sugar being too  high or too low.  please keep a record of the readings and bring it to your next appointment here (or you can bring the meter itself).  You can write it on any piece of paper.  please call us sooner if your blood sugar goes below 70, or if you have a lot of readings over 200.   Please come back for a regular physical appointment in 6 months (must be after 11/29/17).

## 2017-05-29 NOTE — Patient Instructions (Addendum)
Please continue the same metformin. Please see a therapy specialist, in [place].  you will receive a phone call, about a day and time for an appointment.  Here is a new meter.  I have sent a prescription to your pharmacy, for strips.   check your blood sugar once a week.  vary the time of day when you check, between before the 3 meals, and at bedtime.  also check if you have symptoms of your blood sugar being too high or too low.  please keep a record of the readings and bring it to your next appointment here (or you can bring the meter itself).  You can write it on any piece of paper.  please call us sooner if your blood sugar goes below 70, or if you have a lot of readings over 200.   Please come back for a regular physical appointment in 6 months (must be after 11/29/17).

## 2017-06-04 IMAGING — CR DG CERVICAL SPINE COMPLETE 4+V
5 series · 5 of 5 positions shown · non-contrast
Comparison: Chest x-ray 02/18/2016 CT 05/12/2004 .

CLINICAL DATA: Back pain.  Fall .

EXAM:
CERVICAL SPINE - COMPLETE 4+ VIEW

[w c-spine lat]
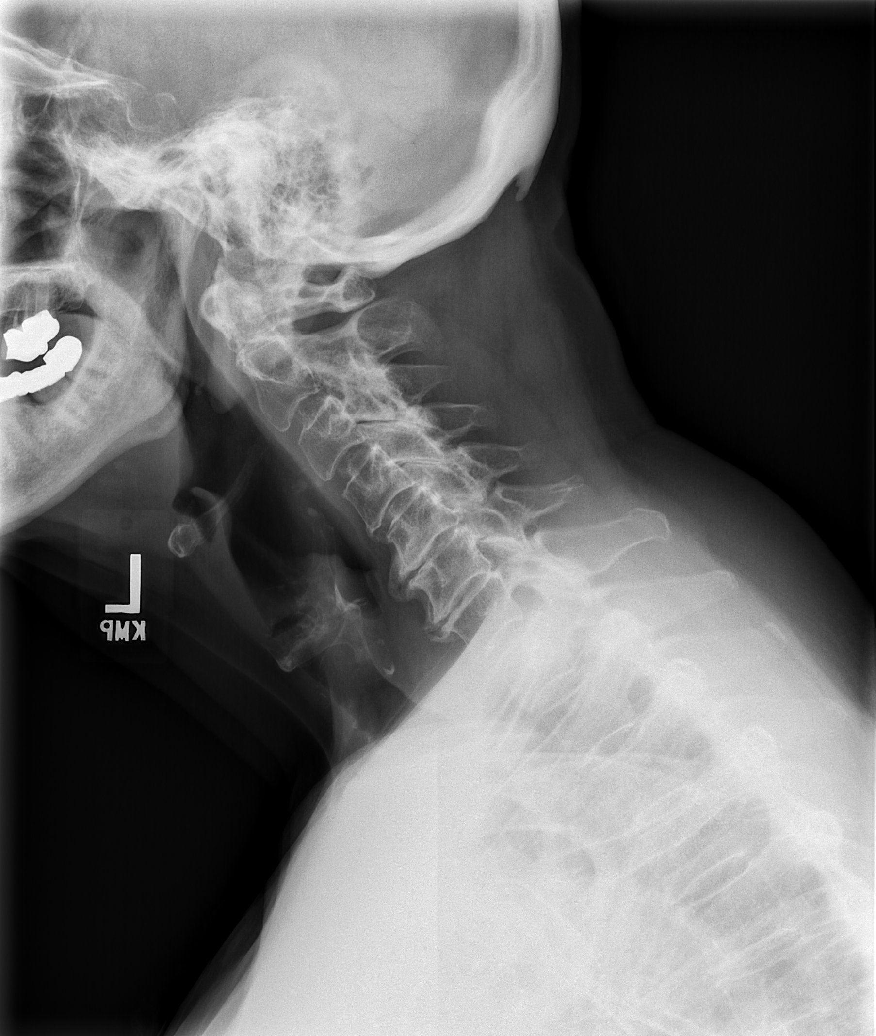

[w c-spine oblique (1 of 2)]
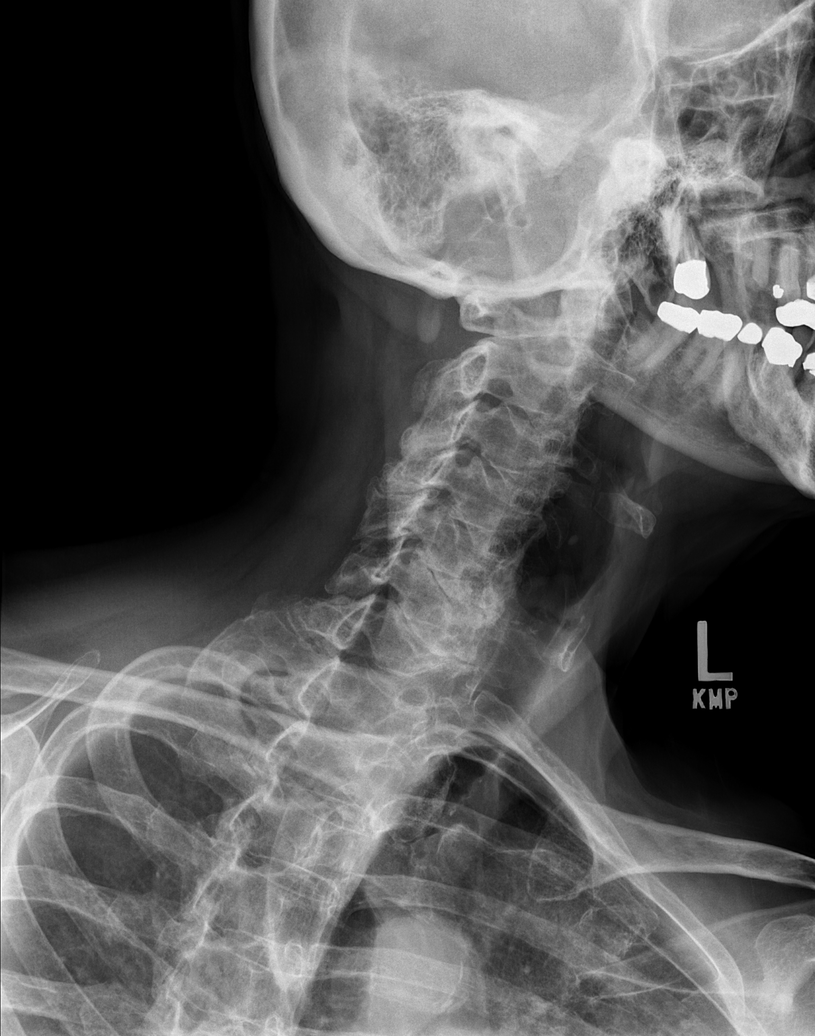

[w c-spine oblique (2 of 2)]
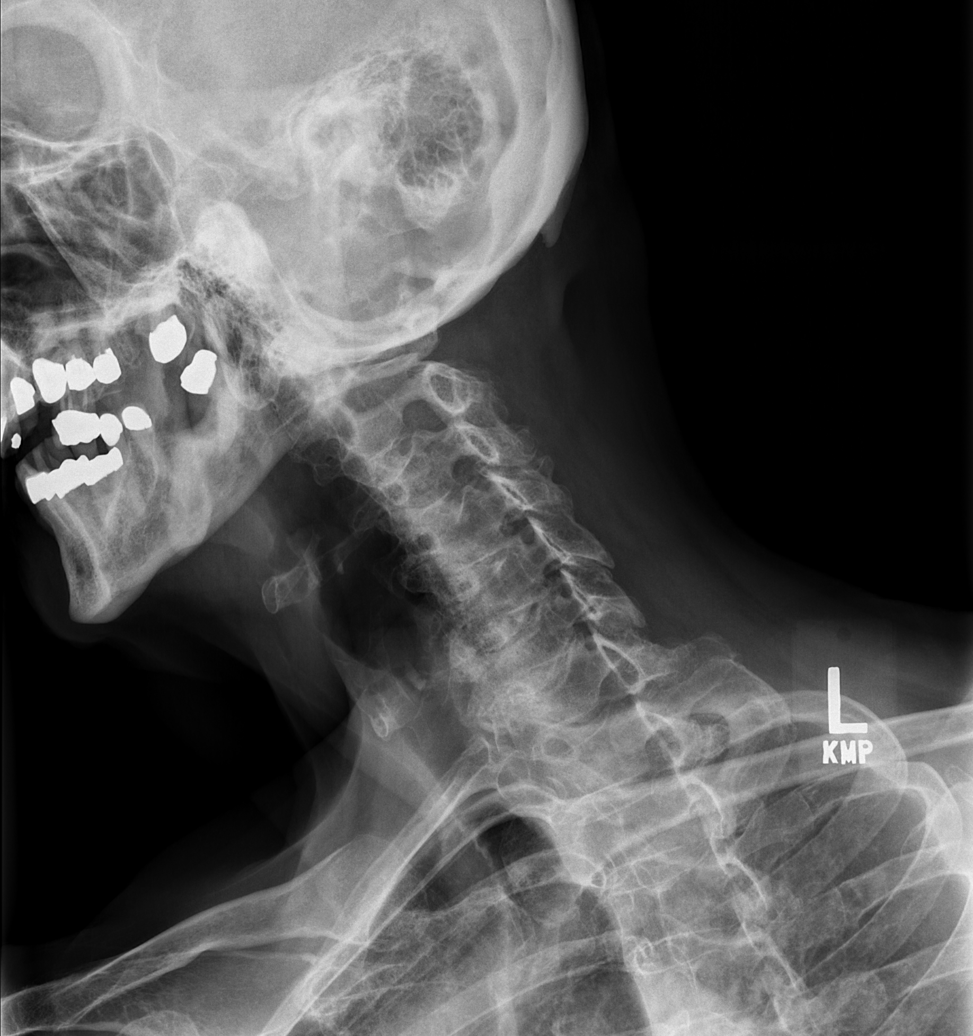

[w c-spine a.p.]
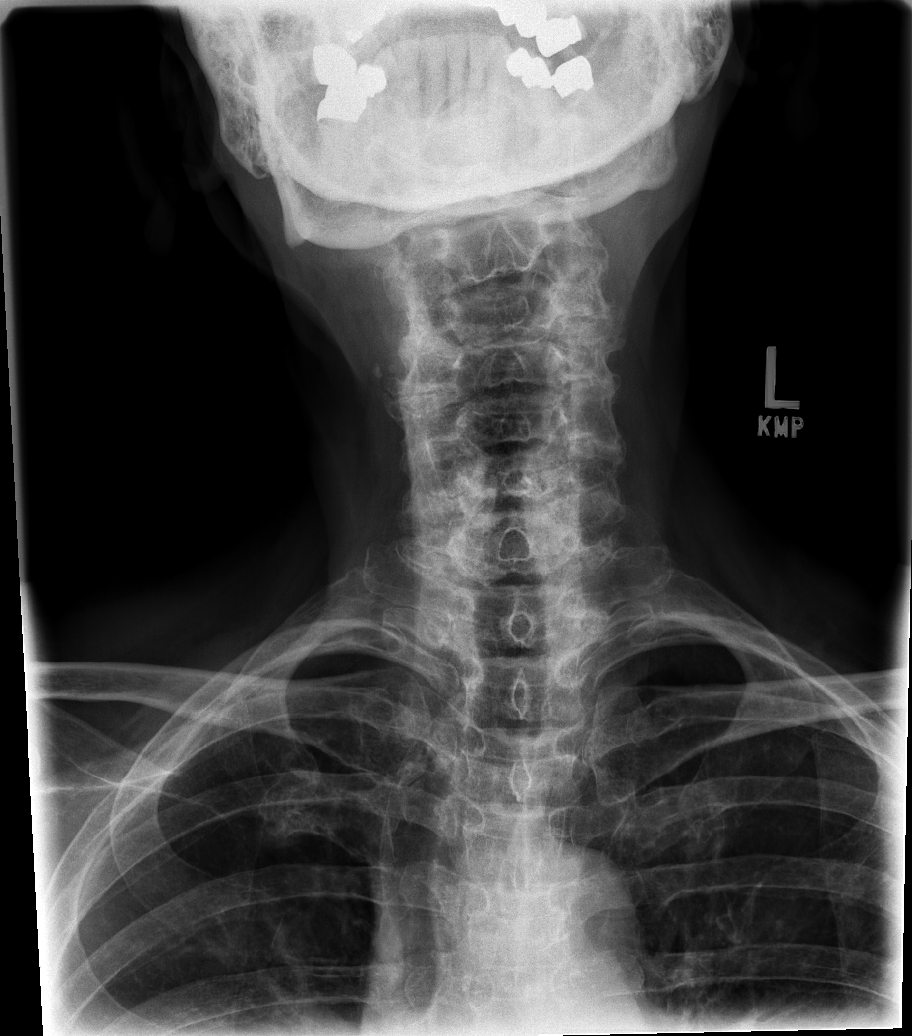

[w c-spine odontoid]
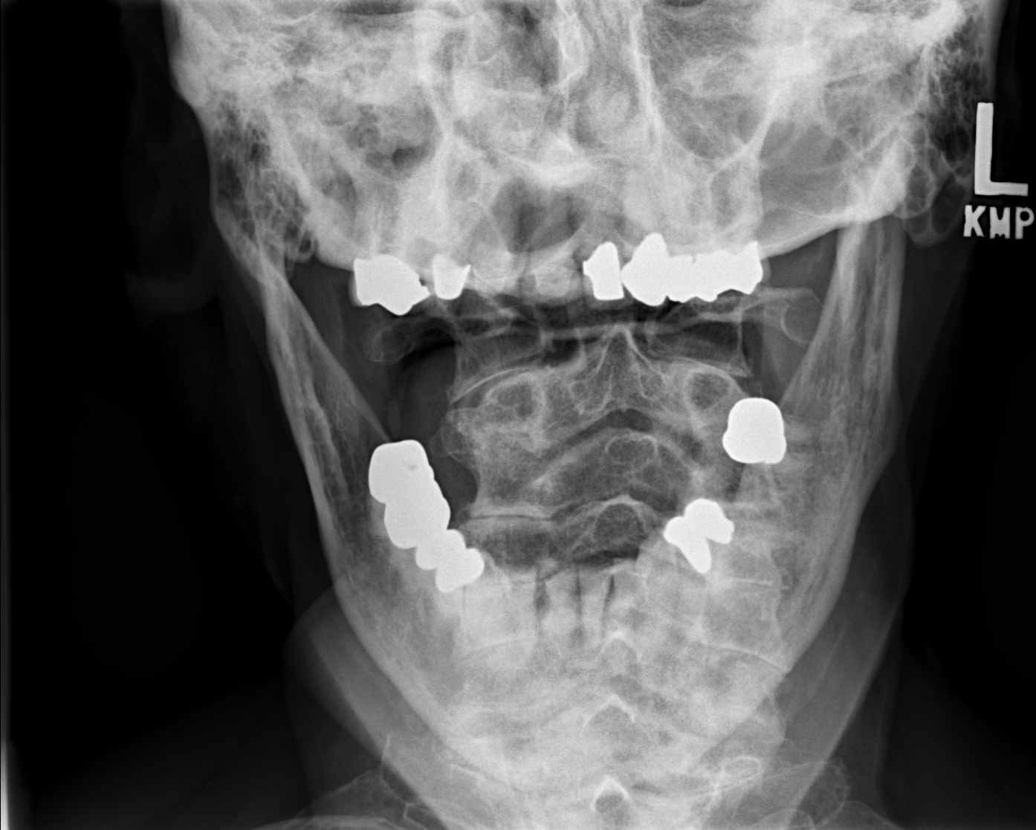

[5 of 5 positions shown; findings below may reference images not displayed]

FINDINGS: Loss of normal cervical lordosis. No evidence of fracture or
dislocation. Pulmonary apices are clear. Right carotid vascular
calcification.
IMPRESSION: 1. Diffuse multilevel prominent degenerative change. Loss of normal
lordosis. No evidence of fracture or dislocation.

2.  Right carotid vascular disease.

## 2017-06-08 ENCOUNTER — Ambulatory Visit: Payer: PPO | Attending: Endocrinology | Admitting: Physical Therapy

## 2017-06-08 DIAGNOSIS — M545 Low back pain, unspecified: Secondary | ICD-10-CM

## 2017-06-08 DIAGNOSIS — G8929 Other chronic pain: Secondary | ICD-10-CM | POA: Insufficient documentation

## 2017-06-08 DIAGNOSIS — R293 Abnormal posture: Secondary | ICD-10-CM | POA: Diagnosis not present

## 2017-06-08 NOTE — Therapy (Addendum)
[facility] [address] [place], [place], [address] Phone: [phone]   Fax:  [phone]  Physical Therapy Evaluation  Patient Details  Name: Destiny Flowers MRN: [id number] Date of Birth: 04/10/1929 Referring Provider: Romero BellingSean [place]  Encounter Date: 06/08/2017    Past Medical History:  Diagnosis Date  . ALLERGIC RHINITIS   . DIABETES MELLITUS, TYPE II 06/06/2007  . DVT (deep venous thrombosis) (HCC)    Left leg  . Dysthymic disorder   . GERD 06/06/2007  . Homocysteinemia (HCC)   . HYPERCHOLESTEROLEMIA 02/19/2008  . HYPERTENSION 06/06/2007  . HYPOTHYROIDISM 06/06/2007  . INSOMNIA   . OBSTRUCTIVE SLEEP APNEA   . OSTEOARTHRITIS   . OSTEOPOROSIS 06/06/2007  . RENAL INSUFFICIENCY 06/06/2007    No past surgical history on file.  There were no vitals filed for this visit.               Objective measurements completed on examination: See above findings.                    PT Short Term Goals - 06/08/17 1523      PT SHORT TERM GOAL #1   Title STG's=LTG's.           PT Long Term Goals - 06/14/17 1321      PT LONG TERM GOAL #1   Title Independent with a HEP.   Time 8   Period Weeks   Status On-going     PT LONG TERM GOAL #2   Title Perform ADL's with pain not > 3/10.   Period Weeks   Status On-going     PT LONG TERM GOAL #3   Title Stand 15 minutes with pain not > 3-4/10.   Time 8   Period Weeks   Status On-going              Patient will benefit from skilled therapeutic intervention in order to improve the following deficits and impairments:  Pain, Decreased activity tolerance, Postural dysfunction  Visit Diagnosis: Chronic left-sided low back pain without sciatica - Plan: PT plan of care cert/re-cert  Abnormal posture - Plan: PT plan of care cert/re-cert     Problem List Patient Active Problem List   Diagnosis Date Noted  . Neck pain 03/07/2017  . Back pain 03/07/2017   . Arteriosclerosis of right carotid artery 03/07/2017  . Myalgia and myositis 10/30/2015  . Ecchymosis 11/27/2014  . Cough 10/17/2014  . Wellness examination 09/08/2014  . edema 04/04/2014  . Encounter for long-term (current) use of other medications 10/01/2013  . Gout 07/02/2012  . Anemia 07/02/2012  . Homocysteinemia (HCC) 07/02/2012  . Vitamin D deficiency 07/02/2012  . Weight loss 11/24/2011  . Bronchitis, chronic obstructive w acute bronchitis (HCC) 11/13/2011  . Rash 08/16/2011  . History of DVT of lower extremity 07/14/2011  . INSOMNIA 05/31/2010  . SYNCOPE 10/05/2009  . FATIGUE 10/05/2009  . CANDIDIASIS, SKIN 09/28/2009  . HYPERCHOLESTEROLEMIA 02/19/2008  . Obstructive sleep apnea 02/06/2008  . Hypothyroidism 06/06/2007  . Diabetes (HCC) 06/06/2007  . Essential hypertension 06/06/2007  . Seasonal and perennial allergic rhinitis 06/06/2007  . GERD 06/06/2007  . Disorder resulting from impaired renal function 06/06/2007  . OSTEOARTHRITIS 06/06/2007  . Osteoporosis 06/06/2007    Regan Mcbryar, [place] MPT 06/27/2017, 1:24 PM  [facility] [address] [place], [place], [address] Phone: [phone]   Fax:  [phone]  Name: Destiny Flowers MRN: [id number] Date of Birth: 05/20/1929

## 2017-06-14 ENCOUNTER — Ambulatory Visit: Payer: PPO | Attending: Endocrinology

## 2017-06-14 DIAGNOSIS — R293 Abnormal posture: Secondary | ICD-10-CM | POA: Diagnosis not present

## 2017-06-14 DIAGNOSIS — M545 Low back pain, unspecified: Secondary | ICD-10-CM

## 2017-06-14 DIAGNOSIS — G8929 Other chronic pain: Secondary | ICD-10-CM | POA: Insufficient documentation

## 2017-06-14 NOTE — Therapy (Signed)
[facility] [address] [place], [place], [address] Phone: [phone]   Fax:  [phone]  Physical Therapy Treatment  Patient Details  Name: Destiny Flowers MRN: [id number] Date of Birth: 12/21/1928 Referring Provider: Romero BellingSean [place]  Encounter Date: 06/14/2017      PT End of Session - 06/14/17 1320    Visit Number 2   Number of Visits 16   Date for PT Re-Evaluation 08/07/17   PT Start Time 1300   PT Stop Time 1348   PT Time Calculation (min) 48 min   Activity Tolerance Patient tolerated treatment well   Behavior During Therapy [facility] for tasks assessed/performed      Past Medical History:  Diagnosis Date  . ALLERGIC RHINITIS   . DIABETES MELLITUS, TYPE II 06/06/2007  . DVT (deep venous thrombosis) (HCC)    Left leg  . Dysthymic disorder   . GERD 06/06/2007  . Homocysteinemia (HCC)   . HYPERCHOLESTEROLEMIA 02/19/2008  . HYPERTENSION 06/06/2007  . HYPOTHYROIDISM 06/06/2007  . INSOMNIA   . OBSTRUCTIVE SLEEP APNEA   . OSTEOARTHRITIS   . OSTEOPOROSIS 06/06/2007  . RENAL INSUFFICIENCY 06/06/2007    No past surgical history on file.  There were no vitals filed for this visit.      Subjective Assessment - 06/14/17 1303    Subjective Pt. noting intermittent mild, aching pain in wrists and knees today however pain free at back.     Patient Stated Goals What to be able to get around better with less pain.   Currently in Pain? No/denies   Pain Score 0-No pain   Multiple Pain Sites No                         [facility] Adult PT Treatment/Exercise - 06/14/17 1324      Exercises   Exercises Lumbar     Lumbar Exercises: Aerobic   Stationary Bike NuStep: lvl 2, 8 min      Lumbar Exercises: Supine   Clam 5 seconds;10 reps   Clam Limitations with abdominal brace    Other Supine Lumbar Exercises Hooklying adduction ball squeeze 5" x 10 reps     Knee/Hip Exercises: Seated   Sit to Sand without UE support;10 reps  from  elevated mat table      Knee/Hip Exercises: Supine   Bridges with Newman PiesBall Squeeze Both;Strengthening;10 reps   Bridges with Clamshell Both;10 reps;Strengthening  with sustained hip abd/ER into green TB      Knee/Hip Exercises: Sidelying   Clams B sidelying clam shell with resistance x 10 reps                 PT Education - 06/14/17 1351    Education provided Yes   Education Details Adduction ball squeeze, sidelying clam shell, bridge, sit<>stand chair to walker   Person(s) Educated Patient   Methods Explanation;Demonstration;Verbal cues;Handout   Comprehension Verbalized understanding;Returned demonstration;Verbal cues required;Need further instruction          PT Short Term Goals - 06/08/17 1523      PT SHORT TERM GOAL #1   Title STG's=LTG's.           PT Long Term Goals - 06/14/17 1321      PT LONG TERM GOAL #1   Title Independent with a HEP.   Time 8   Period Weeks   Status On-going     PT LONG TERM GOAL #2   Title Perform ADL's with  pain not > 3/10.   Period Weeks   Status On-going     PT LONG TERM GOAL #3   Title Stand 15 minutes with pain not > 3-4/10.   Time 8   Period Weeks   Status On-going               Plan - 06/14/17 1355    Clinical Impression Statement Pt. seen to start treatment pain free in lumbar spine thus conservative lumbopelvic strengthening activities initiated for performance at home and creation of HEP.  Pt. noting less ttp over lumbar spine today thus STM deferred to this area.  Pt. ending treatment pain free thus modalities deferred.  Will monitor pt. response to initial HEP and progress per pt. in coming visits.     PT Treatment/Interventions ADLs/Self Care Home Management;Cryotherapy;Electrical Stimulation;Moist Heat;Ultrasound;Functional mobility training;Patient/family education;Therapeutic exercise;Therapeutic activities;Manual techniques;Dry needling   PT Next Visit Plan Tolerance to initial HEP; Modalites and STW/M  to affected lumbar musculature.  Postural exercises with no spinal loading due to OP.      Patient will benefit from skilled therapeutic intervention in order to improve the following deficits and impairments:  Pain, Decreased activity tolerance, Postural dysfunction  Visit Diagnosis: Chronic left-sided low back pain without sciatica  Abnormal posture     Problem List Patient Active Problem List   Diagnosis Date Noted  . Neck pain 03/07/2017  . Back pain 03/07/2017  . Arteriosclerosis of right carotid artery 03/07/2017  . Myalgia and myositis 10/30/2015  . Ecchymosis 11/27/2014  . Cough 10/17/2014  . Wellness examination 09/08/2014  . edema 04/04/2014  . Encounter for long-term (current) use of other medications 10/01/2013  . Gout 07/02/2012  . Anemia 07/02/2012  . Homocysteinemia (HCC) 07/02/2012  . Vitamin D deficiency 07/02/2012  . Weight loss 11/24/2011  . Bronchitis, chronic obstructive w acute bronchitis (HCC) 11/13/2011  . Rash 08/16/2011  . History of DVT of lower extremity 07/14/2011  . INSOMNIA 05/31/2010  . SYNCOPE 10/05/2009  . FATIGUE 10/05/2009  . CANDIDIASIS, SKIN 09/28/2009  . HYPERCHOLESTEROLEMIA 02/19/2008  . Obstructive sleep apnea 02/06/2008  . Hypothyroidism 06/06/2007  . Diabetes (HCC) 06/06/2007  . Essential hypertension 06/06/2007  . Seasonal and perennial allergic rhinitis 06/06/2007  . GERD 06/06/2007  . Disorder resulting from impaired renal function 06/06/2007  . OSTEOARTHRITIS 06/06/2007  . Osteoporosis 06/06/2007    Kermit BaloMicah Brigid Vandekamp, PTA 06/14/17 1:58 PM  [facility] [address] [place], [place], [address] Phone: [phone]   Fax:  [phone]  Name: Destiny Flowers MRN: [id number] Date of Birth: 01/06/1929

## 2017-06-21 ENCOUNTER — Encounter: Payer: Self-pay | Admitting: Physical Therapy

## 2017-06-21 ENCOUNTER — Ambulatory Visit: Payer: PPO | Admitting: Physical Therapy

## 2017-06-21 DIAGNOSIS — M545 Low back pain: Principal | ICD-10-CM

## 2017-06-21 DIAGNOSIS — G8929 Other chronic pain: Secondary | ICD-10-CM

## 2017-06-21 DIAGNOSIS — R293 Abnormal posture: Secondary | ICD-10-CM

## 2017-06-21 NOTE — Therapy (Addendum)
[facility] [address] [place], [place], [address] Phone: [phone]   Fax:  [phone]  Physical Therapy Treatment  Patient Details  Name: Destiny Flowers MRN: [id number] Date of Birth: 08/14/1929 Referring Provider: Romero Belling  Encounter Date: 06/21/2017      PT End of Session - 06/21/17 1314    Visit Number 3   Number of Visits 16   Date for PT Re-Evaluation 08/07/17   PT Start Time 1303   PT Stop Time 1334   PT Time Calculation (min) 31 min   Activity Tolerance Patient limited by fatigue   Behavior During Therapy [facility] for tasks assessed/performed      Past Medical History:  Diagnosis Date  . ALLERGIC RHINITIS   . DIABETES MELLITUS, TYPE II 06/06/2007  . DVT (deep venous thrombosis) (HCC)    Left leg  . Dysthymic disorder   . GERD 06/06/2007  . Homocysteinemia (HCC)   . HYPERCHOLESTEROLEMIA 02/19/2008  . HYPERTENSION 06/06/2007  . HYPOTHYROIDISM 06/06/2007  . INSOMNIA   . OBSTRUCTIVE SLEEP APNEA   . OSTEOARTHRITIS   . OSTEOPOROSIS 06/06/2007  . RENAL INSUFFICIENCY 06/06/2007    No past surgical history on file.  There were no vitals filed for this visit.      Subjective Assessment - 06/21/17 1312    Subjective Reports falling Sunday 06/18/2017 into chair in R rib region which she states is store. Reports that she was able to get herself up. Reports she has been using heat on her sore L side.   Pertinent History OP.   Limitations Standing   How long can you stand comfortably? 5-10 minutes.   Patient Stated Goals What to be able to get around better with less pain.   Currently in Pain? Yes   Pain Score --  No pain score provided by patient   Pain Location Flank   Pain Orientation Left;Posterior;Lateral   Pain Descriptors / Indicators Sore;Tender   Pain Type Chronic pain   Pain Onset More than a month ago            [facility] PT Assessment - 06/21/17 0001      Assessment   Medical Diagnosis Back pain.   Next  MD Visit 11/2017     Restrictions   Weight Bearing Restrictions No                     [facility] Adult PT Treatment/Exercise - 06/21/17 0001      Lumbar Exercises: Aerobic   Stationary Bike L4 x13 min     Knee/Hip Exercises: Seated   Long Arc Quad Strengthening;Both;2 sets;10 reps;Weights   Long Arc Quad Weight 3 lbs.   Ball Squeeze 3x10 reps   Clamshell with Carman Ching  x20 reps   Sit to [employer] 10 reps;without UE support                  PT Short Term Goals - 06/08/17 1523      PT SHORT TERM GOAL #1   Title STG's=LTG's.           PT Long Term Goals - 06/14/17 1321      PT LONG TERM GOAL #1   Title Independent with a HEP.   Time 8   Period Weeks   Status On-going     PT LONG TERM GOAL #2   Title Perform ADL's with pain not > 3/10.   Period Weeks   Status On-going  PT LONG TERM GOAL #3   Title Stand 15 minutes with pain not > 3-4/10.   Time 8   Period Weeks   Status On-going               Plan - 06/21/17 1342    Clinical Impression Statement Patient limited today secondary to soreness from recent fall 06/18/2017. Patient presented with bruising over R posteriolateral flank from fall and hitting a chair in her home. Patient limited with seated strengthening exercises secondary to fatigue. Patient also fatigued with sit to stands from plinth height just superior to knee height. Patient requested to end treatment early secondary to fatigue. Patient educated to use ice or heat as she reports she has for 10-20 minutes only to avoid skin irritation or burns.   Rehab Potential Good   PT Frequency 2x / week   PT Duration 8 weeks   PT Treatment/Interventions ADLs/Self Care Home Management;Cryotherapy;Electrical Stimulation;Moist Heat;Ultrasound;Functional mobility training;Patient/family education;Therapeutic exercise;Therapeutic activities;Manual techniques;Dry needling   PT Next Visit Plan Tolerance to initial HEP; Modalites and STW/M to  affected lumbar musculature.  Postural exercises with no spinal loading due to OP.   Consulted and Agree with Plan of Care Patient      Patient will benefit from skilled therapeutic intervention in order to improve the following deficits and impairments:  Pain, Decreased activity tolerance, Postural dysfunction  Visit Diagnosis: Chronic left-sided low back pain without sciatica  Abnormal posture     Problem List Patient Active Problem List   Diagnosis Date Noted  . Neck pain 03/07/2017  . Back pain 03/07/2017  . Arteriosclerosis of right carotid artery 03/07/2017  . Myalgia and myositis 10/30/2015  . Ecchymosis 11/27/2014  . Cough 10/17/2014  . Wellness examination 09/08/2014  . edema 04/04/2014  . Encounter for long-term (current) use of other medications 10/01/2013  . Gout 07/02/2012  . Anemia 07/02/2012  . Homocysteinemia (HCC) 07/02/2012  . Vitamin D deficiency 07/02/2012  . Weight loss 11/24/2011  . Bronchitis, chronic obstructive w acute bronchitis (HCC) 11/13/2011  . Rash 08/16/2011  . History of DVT of lower extremity 07/14/2011  . INSOMNIA 05/31/2010  . SYNCOPE 10/05/2009  . FATIGUE 10/05/2009  . CANDIDIASIS, SKIN 09/28/2009  . HYPERCHOLESTEROLEMIA 02/19/2008  . Obstructive sleep apnea 02/06/2008  . Hypothyroidism 06/06/2007  . Diabetes (HCC) 06/06/2007  . Essential hypertension 06/06/2007  . Seasonal and perennial allergic rhinitis 06/06/2007  . GERD 06/06/2007  . Disorder resulting from impaired renal function 06/06/2007  . OSTEOARTHRITIS 06/06/2007  . Osteoporosis 06/06/2007    Evelene CroonKelsey M Parsons, PTA 06/21/2017, 1:59 PM  [facility] [address] [place], [place], [address] Phone: [phone]   Fax:  [phone]  Name: Destiny Flowers MRN: [id number] Date of Birth: 08/17/1929

## 2017-06-28 ENCOUNTER — Encounter: Payer: Self-pay | Admitting: Physical Therapy

## 2017-06-28 ENCOUNTER — Ambulatory Visit: Payer: PPO | Admitting: Physical Therapy

## 2017-06-28 DIAGNOSIS — M545 Low back pain, unspecified: Secondary | ICD-10-CM

## 2017-06-28 DIAGNOSIS — R293 Abnormal posture: Secondary | ICD-10-CM

## 2017-06-28 DIAGNOSIS — G8929 Other chronic pain: Secondary | ICD-10-CM

## 2017-06-28 NOTE — Therapy (Signed)
[facility] [address] [place], [place], [address] Phone: [phone]   Fax:  [phone]  Physical Therapy Treatment  Patient Details  Name: Destiny Flowers MRN: [id number] Date of Birth: 06/01/1929 Referring Provider: Romero BellingSean [place]  Encounter Date: 06/28/2017      PT End of Session - 06/28/17 1348    Visit Number 4   Number of Visits 16   Date for PT Re-Evaluation 08/07/17   PT Start Time 1316   PT Stop Time 1359   PT Time Calculation (min) 43 min   Activity Tolerance Patient tolerated treatment well   Behavior During Therapy [facility] for tasks assessed/performed      Past Medical History:  Diagnosis Date  . ALLERGIC RHINITIS   . DIABETES MELLITUS, TYPE II 06/06/2007  . DVT (deep venous thrombosis) (HCC)    Left leg  . Dysthymic disorder   . GERD 06/06/2007  . Homocysteinemia (HCC)   . HYPERCHOLESTEROLEMIA 02/19/2008  . HYPERTENSION 06/06/2007  . HYPOTHYROIDISM 06/06/2007  . INSOMNIA   . OBSTRUCTIVE SLEEP APNEA   . OSTEOARTHRITIS   . OSTEOPOROSIS 06/06/2007  . RENAL INSUFFICIENCY 06/06/2007    History reviewed. No pertinent surgical history.  There were no vitals filed for this visit.      Subjective Assessment - 06/28/17 1321    Subjective Patient reported increased soreness in back today for unknown reason   Patient is accompained by: --  friend   Pertinent History OP.   Limitations Standing   How long can you stand comfortably? 5-10 minutes.   Patient Stated Goals What to be able to get around better with less pain.   Currently in Pain? Yes   Pain Score 5    Pain Location Back   Pain Descriptors / Indicators Discomfort   Pain Type Chronic pain   Pain Onset More than a month ago   Pain Frequency Intermittent   Aggravating Factors  increased activity   Pain Relieving Factors at rest                         [facility]Ex[facility] - 06/28/17 0001      Lumbar Exercises: Aerobic   Stationary Bike L4 x10 min UE/LE     Lumbar Exercises: Supine   Clam 20 reps  yellow t-band/ seated   Other Supine Lumbar Exercises seated retractions 2x10/yellow t-band     Knee/Hip Exercises: Standing   Other Standing Knee Exercises standing push down on green swiss ball for core activation and posture x20     Knee/Hip Exercises: Seated   Long Arc Quad Strengthening;Both;2 sets;10 reps;Weights   Long Arc Quad Weight 3 lbs.   Ball Squeeze 3x10 reps     Moist Heat Therapy   Number Minutes Moist Heat 15 Minutes   Moist Heat Location Lumbar Spine     Electrical Stimulation   Electrical Stimulation Location Lumbar   Electrical Stimulation Action IFC   Electrical Stimulation Parameters 80-150hz  x6415min   Electrical Stimulation Goals Tone;Pain                  PT Short Term Goals - 06/08/17 1523      PT SHORT TERM GOAL #1   Title STG's=LTG's.           PT Long Term Goals - 06/14/17 1321      PT LONG TERM GOAL #1   Title Independent with a HEP.   Time 8  Period Weeks   Status On-going     PT LONG TERM GOAL #2   Title Perform ADL's with pain not > 3/10.   Period Weeks   Status On-going     PT LONG TERM GOAL #3   Title Stand 15 minutes with pain not > 3-4/10.   Time 8   Period Weeks   Status On-going               Plan - 06/28/17 1346    Clinical Impression Statement Patient tolerated treatment with no complaints today. Patient has ongoing pain due to chronic back pain and her recent fall at home. Patient has been unable to perform HEP due to pain in back this week. Patient current goals ongoing due to pain deficts.    Rehab Potential Good   PT Frequency 2x / week   PT Duration 8 weeks   PT Treatment/Interventions ADLs/Self Care Home Management;Cryotherapy;Electrical Stimulation;Moist Heat;Ultrasound;Functional mobility training;Patient/family education;Therapeutic exercise;Therapeutic activities;Manual techniques;Dry needling   PT Next Visit  Plan cont with POC for Modalites and STW/M to affected lumbar musculature.  Postural exercises with no spinal loading due to OP.   Consulted and Agree with Plan of Care Patient      Patient will benefit from skilled therapeutic intervention in order to improve the following deficits and impairments:  Pain, Decreased activity tolerance, Postural dysfunction  Visit Diagnosis: Chronic left-sided low back pain without sciatica  Abnormal posture     Problem List Patient Active Problem List   Diagnosis Date Noted  . Neck pain 03/07/2017  . Back pain 03/07/2017  . Arteriosclerosis of right carotid artery 03/07/2017  . Myalgia and myositis 10/30/2015  . Ecchymosis 11/27/2014  . Cough 10/17/2014  . Wellness examination 09/08/2014  . edema 04/04/2014  . Encounter for long-term (current) use of other medications 10/01/2013  . Gout 07/02/2012  . Anemia 07/02/2012  . Homocysteinemia (HCC) 07/02/2012  . Vitamin D deficiency 07/02/2012  . Weight loss 11/24/2011  . Bronchitis, chronic obstructive w acute bronchitis (HCC) 11/13/2011  . Rash 08/16/2011  . History of DVT of lower extremity 07/14/2011  . INSOMNIA 05/31/2010  . SYNCOPE 10/05/2009  . FATIGUE 10/05/2009  . CANDIDIASIS, SKIN 09/28/2009  . HYPERCHOLESTEROLEMIA 02/19/2008  . Obstructive sleep apnea 02/06/2008  . Hypothyroidism 06/06/2007  . Diabetes (HCC) 06/06/2007  . Essential hypertension 06/06/2007  . Seasonal and perennial allergic rhinitis 06/06/2007  . GERD 06/06/2007  . Disorder resulting from impaired renal function 06/06/2007  . OSTEOARTHRITIS 06/06/2007  . Osteoporosis 06/06/2007    Destiny Flowers, Destiny Flowers 06/28/2017, 2:00 PM  [facility] [address] [place], [place], [address] Phone: [phone]   Fax:  [phone]  Name: Destiny Flowers MRN: [id number] Date of Birth: August 19, 1929

## 2017-07-05 ENCOUNTER — Ambulatory Visit: Payer: PPO

## 2017-07-05 DIAGNOSIS — G8929 Other chronic pain: Secondary | ICD-10-CM

## 2017-07-05 DIAGNOSIS — M545 Low back pain, unspecified: Secondary | ICD-10-CM

## 2017-07-05 DIAGNOSIS — R293 Abnormal posture: Secondary | ICD-10-CM

## 2017-07-05 NOTE — Therapy (Signed)
[facility] [address] [place], [place], [address] Phone: [phone]   Fax:  [phone]  Physical Therapy Treatment  Patient Details  Name: Destiny Flowers MRN: [id number] Date of Birth: October 15, 1929 Referring Provider: Romero Belling  Encounter Date: 07/05/2017      PT End of Session - 07/05/17 1305    Visit Number 5   Number of Visits 16   Date for PT Re-Evaluation 08/07/17   PT Start Time 1301   PT Stop Time 1358  10 min moist heat to end treatment   PT Time Calculation (min) 57 min   Activity Tolerance Patient tolerated treatment well   Behavior During Therapy [facility] for tasks assessed/performed      Past Medical History:  Diagnosis Date  . ALLERGIC RHINITIS   . DIABETES MELLITUS, TYPE II 06/06/2007  . DVT (deep venous thrombosis) (HCC)    Left leg  . Dysthymic disorder   . GERD 06/06/2007  . Homocysteinemia (HCC)   . HYPERCHOLESTEROLEMIA 02/19/2008  . HYPERTENSION 06/06/2007  . HYPOTHYROIDISM 06/06/2007  . INSOMNIA   . OBSTRUCTIVE SLEEP APNEA   . OSTEOARTHRITIS   . OSTEOPOROSIS 06/06/2007  . RENAL INSUFFICIENCY 06/06/2007    No past surgical history on file.  There were no vitals filed for this visit.      Subjective Assessment - 07/05/17 1304    Subjective Pt. noting having pain while getting in and out of car.     Patient Stated Goals What to be able to get around better with less pain.   Currently in Pain? No/denies   Pain Score 0-No pain   Multiple Pain Sites No                         [facility] Adult PT Treatment/Exercise - 07/05/17 1311      Lumbar Exercises: Aerobic   Stationary Bike L4 x10 min UE/LE     Knee/Hip Exercises: Standing   Hip Flexion Right;Left;10 reps;Knee bent   Hip Flexion Limitations in RW    Hip Abduction Right;Left;10 reps;Knee straight   Abduction Limitations in RW    Hip Extension Right;Left;10 reps;Knee straight   Extension Limitations in RW      Knee/Hip Exercises:  Seated   Long Arc Quad Strengthening;Both;Weights;15 reps;1 set  3" hold    Long Arc Quad Weight 3 lbs.   Long [employer] Limitations with adduction ball squeeze    Ball Squeeze 5" x 15 reps    Clamshell with TheraBand Red  x 30 reps   Hamstring Curl Right;Left;10 reps   Sit to [employer] 10 reps;without UE support     Moist Heat Therapy   Number Minutes Moist Heat 10 Minutes   Moist Heat Location Lumbar Spine                PT Education - 07/05/17 1356    Education provided Yes   Education Details Hip abduction,extension, flexion (bent knee) in RW   Person(s) Educated Patient   Methods Explanation;Demonstration;Verbal cues;Handout   Comprehension Verbalized understanding;Returned demonstration;Verbal cues required;Need further instruction          PT Short Term Goals - 06/08/17 1523      PT SHORT TERM GOAL #1   Title STG's=LTG's.           PT Long Term Goals - 06/14/17 1321      PT LONG TERM GOAL #1   Title Independent with a HEP.   Time  8   Period Weeks   Status On-going     PT LONG TERM GOAL #2   Title Perform ADL's with pain not > 3/10.   Period Weeks   Status On-going     PT LONG TERM GOAL #3   Title Stand 15 minutes with pain not > 3-4/10.   Time 8   Period Weeks   Status On-going               Plan - 07/05/17 1305    Clinical Impression Statement Pt. noting she feels she has "just about recovered from my fall".  Tolerated all hip/knee strengthening activity today well noting LE fatigue following treatment.  HEP updated with LE strengthening activities with pt. to perform in RW.     PT Treatment/Interventions ADLs/Self Care Home Management;Cryotherapy;Electrical Stimulation;Moist Heat;Ultrasound;Functional mobility training;Patient/family education;Therapeutic exercise;Therapeutic activities;Manual techniques;Dry needling   PT Next Visit Plan cont with POC for Modalites and STW/M to affected lumbar musculature.  Postural exercises with no  spinal loading due to OP.      Patient will benefit from skilled therapeutic intervention in order to improve the following deficits and impairments:  Pain, Decreased activity tolerance, Postural dysfunction  Visit Diagnosis: Chronic left-sided low back pain without sciatica  Abnormal posture     Problem List Patient Active Problem List   Diagnosis Date Noted  . Neck pain 03/07/2017  . Back pain 03/07/2017  . Arteriosclerosis of right carotid artery 03/07/2017  . Myalgia and myositis 10/30/2015  . Ecchymosis 11/27/2014  . Cough 10/17/2014  . Wellness examination 09/08/2014  . edema 04/04/2014  . Encounter for long-term (current) use of other medications 10/01/2013  . Gout 07/02/2012  . Anemia 07/02/2012  . Homocysteinemia (HCC) 07/02/2012  . Vitamin D deficiency 07/02/2012  . Weight loss 11/24/2011  . Bronchitis, chronic obstructive w acute bronchitis (HCC) 11/13/2011  . Rash 08/16/2011  . History of DVT of lower extremity 07/14/2011  . INSOMNIA 05/31/2010  . SYNCOPE 10/05/2009  . FATIGUE 10/05/2009  . CANDIDIASIS, SKIN 09/28/2009  . HYPERCHOLESTEROLEMIA 02/19/2008  . Obstructive sleep apnea 02/06/2008  . Hypothyroidism 06/06/2007  . Diabetes (HCC) 06/06/2007  . Essential hypertension 06/06/2007  . Seasonal and perennial allergic rhinitis 06/06/2007  . GERD 06/06/2007  . Disorder resulting from impaired renal function 06/06/2007  . OSTEOARTHRITIS 06/06/2007  . Osteoporosis 06/06/2007    Kermit Balo, PTA 07/05/17 2:22 PM  [facility] [address] [place], [place], [address] Phone: [phone]   Fax:  [phone]  Name: Destiny Flowers MRN: [id number] Date of Birth: [birth year]

## 2017-07-12 ENCOUNTER — Ambulatory Visit: Payer: PPO | Admitting: Physical Therapy

## 2017-07-12 DIAGNOSIS — M545 Low back pain, unspecified: Secondary | ICD-10-CM

## 2017-07-12 DIAGNOSIS — R293 Abnormal posture: Secondary | ICD-10-CM

## 2017-07-12 DIAGNOSIS — G8929 Other chronic pain: Secondary | ICD-10-CM

## 2017-07-12 NOTE — Therapy (Signed)
[facility] [address] [place], [place], [address] Phone: [phone]   Fax:  [phone]  Physical Therapy Treatment  Patient Details  Name: Destiny Flowers MRN: [id number] Date of Birth: [birth year] Referring Provider: Romero Belling  Encounter Date: 07/12/2017      PT End of Session - 07/12/17 1404    Visit Number 6   Number of Visits 16   Date for PT Re-Evaluation 08/07/17   PT Start Time 0100   PT Stop Time 0202   PT Time Calculation (min) 62 min      Past Medical History:  Diagnosis Date  . ALLERGIC RHINITIS   . DIABETES MELLITUS, TYPE II 06/06/2007  . DVT (deep venous thrombosis) (HCC)    Left leg  . Dysthymic disorder   . GERD 06/06/2007  . Homocysteinemia (HCC)   . HYPERCHOLESTEROLEMIA 02/19/2008  . HYPERTENSION 06/06/2007  . HYPOTHYROIDISM 06/06/2007  . INSOMNIA   . OBSTRUCTIVE SLEEP APNEA   . OSTEOARTHRITIS   . OSTEOPOROSIS 06/06/2007  . RENAL INSUFFICIENCY 06/06/2007    No past surgical history on file.  There were no vitals filed for this visit.      Subjective Assessment - 07/12/17 1348    Subjective I think I could stand straighter if I had less pain when I try to stand upright.   Patient Stated Goals What to be able to get around better with less pain.   Pain Score 2    Pain Location Back   Pain Orientation Lower;Mid   Pain Descriptors / Indicators Discomfort;Dull   Pain Type Chronic pain   Pain Onset More than a month ago                         [facility] Adult PT Treatment/Exercise - 07/12/17 0001      Exercises   Exercises Knee/Hip     Lumbar Exercises: Aerobic   Stationary Bike Level 4 x 10 minutes.     Moist Heat Therapy   Number Minutes Moist Heat 20 Minutes   Moist Heat Location --  Lumbar spine.     Programme researcher, broadcasting/film/video Location Lumbar.   Electrical Stimulation Action IFC   Electrical Stimulation Parameters 80-150 hz x 20 minutes.   Electrical  Stimulation Goals Tone;Pain     Manual Therapy   Manual Therapy Soft tissue mobilization   Manual therapy comments Seated with arms resting on 2 pillows on plinth:  STW/M to thoracic and lumbar musculature x 13 minutes.                  PT Short Term Goals - 06/08/17 1523      PT SHORT TERM GOAL #1   Title STG's=LTG's.           PT Long Term Goals - 06/14/17 1321      PT LONG TERM GOAL #1   Title Independent with a HEP.   Time 8   Period Weeks   Status On-going     PT LONG TERM GOAL #2   Title Perform ADL's with pain not > 3/10.   Period Weeks   Status On-going     PT LONG TERM GOAL #3   Title Stand 15 minutes with pain not > 3-4/10.   Time 8   Period Weeks   Status On-going             Patient will benefit from skilled therapeutic intervention in order  to improve the following deficits and impairments:     Visit Diagnosis: Chronic left-sided low back pain without sciatica  Abnormal posture     Problem List Patient Active Problem List   Diagnosis Date Noted  . Neck pain 03/07/2017  . Back pain 03/07/2017  . Arteriosclerosis of right carotid artery 03/07/2017  . Myalgia and myositis 10/30/2015  . Ecchymosis 11/27/2014  . Cough 10/17/2014  . Wellness examination 09/08/2014  . edema 04/04/2014  . Encounter for long-term (current) use of other medications 10/01/2013  . Gout 07/02/2012  . Anemia 07/02/2012  . Homocysteinemia (HCC) 07/02/2012  . Vitamin D deficiency 07/02/2012  . Weight loss 11/24/2011  . Bronchitis, chronic obstructive w acute bronchitis (HCC) 11/13/2011  . Rash 08/16/2011  . History of DVT of lower extremity 07/14/2011  . INSOMNIA 05/31/2010  . SYNCOPE 10/05/2009  . FATIGUE 10/05/2009  . CANDIDIASIS, SKIN 09/28/2009  . HYPERCHOLESTEROLEMIA 02/19/2008  . Obstructive sleep apnea 02/06/2008  . Hypothyroidism 06/06/2007  . Diabetes (HCC) 06/06/2007  . Essential hypertension 06/06/2007  . Seasonal and perennial  allergic rhinitis 06/06/2007  . GERD 06/06/2007  . Disorder resulting from impaired renal function 06/06/2007  . OSTEOARTHRITIS 06/06/2007  . Osteoporosis 06/06/2007    Eevie Lapp, [place] MPT 07/12/2017, 2:10 PM  [facility] [address] [place], [place], [address] Phone: [phone]   Fax:  [phone]  Name: Destiny Flowers MRN: [id number] Date of Birth: [birth year]

## 2017-07-19 ENCOUNTER — Encounter: Payer: Self-pay | Admitting: Physical Therapy

## 2017-07-19 ENCOUNTER — Ambulatory Visit: Payer: PPO | Attending: Endocrinology | Admitting: Physical Therapy

## 2017-07-19 DIAGNOSIS — G8929 Other chronic pain: Secondary | ICD-10-CM

## 2017-07-19 DIAGNOSIS — M545 Low back pain, unspecified: Secondary | ICD-10-CM

## 2017-07-19 DIAGNOSIS — R293 Abnormal posture: Secondary | ICD-10-CM | POA: Diagnosis not present

## 2017-07-19 NOTE — Therapy (Signed)
[facility] [address] [place], [place], [address] Phone: [phone]   Fax:  [phone]  Physical Therapy Treatment  Patient Details  Name: Destiny Flowers MRN: [id number] Date of Birth: 10-21-1929 Referring Provider: Romero Belling  Encounter Date: 07/19/2017      PT End of Session - 07/19/17 1316    Visit Number 7   Number of Visits 16   Date for PT Re-Evaluation 08/07/17   PT Start Time 1115   PT Stop Time 1208   PT Time Calculation (min) 53 min   Activity Tolerance Patient tolerated treatment well   Behavior During Therapy [facility] for tasks assessed/performed      Past Medical History:  Diagnosis Date  . ALLERGIC RHINITIS   . DIABETES MELLITUS, TYPE II 06/06/2007  . DVT (deep venous thrombosis) (HCC)    Left leg  . Dysthymic disorder   . GERD 06/06/2007  . Homocysteinemia (HCC)   . HYPERCHOLESTEROLEMIA 02/19/2008  . HYPERTENSION 06/06/2007  . HYPOTHYROIDISM 06/06/2007  . INSOMNIA   . OBSTRUCTIVE SLEEP APNEA   . OSTEOARTHRITIS   . OSTEOPOROSIS 06/06/2007  . RENAL INSUFFICIENCY 06/06/2007    History reviewed. No pertinent surgical history.  There were no vitals filed for this visit.      Subjective Assessment - 07/19/17 1313    Subjective That last treatment helped.   Pain Score 2    Pain Location Back   Pain Orientation Lower;Mid   Pain Descriptors / Indicators Discomfort;Dull   Pain Type Chronic pain   Pain Onset More than a month ago                         [facility] Adult PT Treatment/Exercise - 07/19/17 0001      Exercises   Exercises Knee/Hip     Lumbar Exercises: Aerobic   Stationary Bike Level 4 x 15 minutes.     Modalities   Modalities Electrical Stimulation;Moist Heat     Moist Heat Therapy   Number Minutes Moist Heat 20 Minutes   Moist Heat Location Lumbar Spine     Electrical Stimulation   Electrical Stimulation Location Mid-T-L-Spine.   Electrical Stimulation Action IFC   Electrical Stimulation Parameters 80-150 Hz at 100% scan x 20 minutes.   Electrical Stimulation Goals Tone;Pain     Manual Therapy   Manual Therapy Soft tissue mobilization   Manual therapy comments Seated:  STW/M x 10 minutes to affected spinal musculature x 10 minutes.                  PT Short Term Goals - 06/08/17 1523      PT SHORT TERM GOAL #1   Title STG's=LTG's.           PT Long Term Goals - 06/14/17 1321      PT LONG TERM GOAL #1   Title Independent with a HEP.   Time 8   Period Weeks   Status On-going     PT LONG TERM GOAL #2   Title Perform ADL's with pain not > 3/10.   Period Weeks   Status On-going     PT LONG TERM GOAL #3   Title Stand 15 minutes with pain not > 3-4/10.   Time 8   Period Weeks   Status On-going               Plan - 07/19/17 1316    Clinical Impression Statement Excellent response to ttreatment  today.  No pain complaints following.  Excellent cadenece on Nustep.      Patient will benefit from skilled therapeutic intervention in order to improve the following deficits and impairments:  Pain, Decreased activity tolerance, Postural dysfunction  Visit Diagnosis: Chronic left-sided low back pain without sciatica  Abnormal posture     Problem List Patient Active Problem List   Diagnosis Date Noted  . Neck pain 03/07/2017  . Back pain 03/07/2017  . Arteriosclerosis of right carotid artery 03/07/2017  . Myalgia and myositis 10/30/2015  . Ecchymosis 11/27/2014  . Cough 10/17/2014  . Wellness examination 09/08/2014  . edema 04/04/2014  . Encounter for long-term (current) use of other medications 10/01/2013  . Gout 07/02/2012  . Anemia 07/02/2012  . Homocysteinemia (HCC) 07/02/2012  . Vitamin D deficiency 07/02/2012  . Weight loss 11/24/2011  . Bronchitis, chronic obstructive w acute bronchitis (HCC) 11/13/2011  . Rash 08/16/2011  . History of DVT of lower extremity 07/14/2011  . INSOMNIA 05/31/2010  .  SYNCOPE 10/05/2009  . FATIGUE 10/05/2009  . CANDIDIASIS, SKIN 09/28/2009  . HYPERCHOLESTEROLEMIA 02/19/2008  . Obstructive sleep apnea 02/06/2008  . Hypothyroidism 06/06/2007  . Diabetes (HCC) 06/06/2007  . Essential hypertension 06/06/2007  . Seasonal and perennial allergic rhinitis 06/06/2007  . GERD 06/06/2007  . Disorder resulting from impaired renal function 06/06/2007  . OSTEOARTHRITIS 06/06/2007  . Osteoporosis 06/06/2007    Joseph Johns, [place] MPT 07/19/2017, 1:19 PM  [facility] [address] [place], [place], [address] Phone: [phone]   Fax:  [phone]  Name: Destiny Flowers MRN: [id number] Date of Birth: 08/10/1929

## 2017-07-26 ENCOUNTER — Ambulatory Visit: Payer: PPO | Admitting: Physical Therapy

## 2017-07-26 DIAGNOSIS — M545 Low back pain: Principal | ICD-10-CM

## 2017-07-26 DIAGNOSIS — G8929 Other chronic pain: Secondary | ICD-10-CM

## 2017-07-26 DIAGNOSIS — R293 Abnormal posture: Secondary | ICD-10-CM

## 2017-07-26 NOTE — Therapy (Signed)
[facility] [address] [place], [place], [address] Phone: [phone]   Fax:  [phone]  Physical Therapy Treatment  Patient Details  Name: Destiny Flowers MRN: [id number] Date of Birth: 11/07/1929 Referring Provider: Romero BellingSean Flowers  Encounter Date: 07/26/2017      PT End of Session - 07/26/17 1338    Visit Number 8   Number of Visits 16   Date for PT Re-Evaluation 08/07/17   PT Start Time 0100   PT Stop Time 0154   PT Time Calculation (min) 54 min   Activity Tolerance Patient tolerated treatment well   Behavior [facility] for tasks assessed/performed      Past Medical History:  Diagnosis Date  . ALLERGIC RHINITIS   . DIABETES MELLITUS, TYPE II 06/06/2007  . DVT (deep venous thrombosis) (HCC)    Left leg  . Dysthymic disorder   . GERD 06/06/2007  . Homocysteinemia (HCC)   . HYPERCHOLESTEROLEMIA 02/19/2008  . HYPERTENSION 06/06/2007  . HYPOTHYROIDISM 06/06/2007  . INSOMNIA   . OBSTRUCTIVE SLEEP APNEA   . OSTEOARTHRITIS   . OSTEOPOROSIS 06/06/2007  . RENAL INSUFFICIENCY 06/06/2007    No past surgical history on file.  There were no vitals filed for this visit.      Subjective Assessment - 07/26/17 1344    Subjective These treatments are helping a lot.   Pain Score 2    Pain Location Back   Pain Orientation Lower;Mid   Pain Descriptors / Indicators Dull;Discomfort   Pain Onset More than a month ago                         [facility]Ex[facility] - 07/26/17 0001      Exercises   Exercises Knee/Hip     Lumbar Exercises: Aerobic   Stationary Bike Level 4 x 12 minutes.     Programme researcher, broadcasting/film/videolectrical Stimulation   Electrical Stimulation Location Mid-T and L-spine.   Electrical Stimulation Action IFC   Electrical Stimulation Parameters 80-150 hz x 20 minutes.   Electrical Stimulation Goals Tone;Pain     Manual Therapy   Manual Therapy Soft tissue mobilization   Manual therapy comments STW/M to  affected spinal musculature x 12 minutes.                  PT Short Term Goals - 06/08/17 1523      PT SHORT TERM GOAL #1   Title STG's=LTG's.           PT Long Term Goals - 06/14/17 1321      PT LONG TERM GOAL #1   Title Independent with a HEP.   Time 8   Period Weeks   Status On-going     PT LONG TERM GOAL #2   Title Perform ADL's with pain not > 3/10.   Period Weeks   Status On-going     PT LONG TERM GOAL #3   Title Stand 15 minutes with pain not > 3-4/10.   Time 8   Period Weeks   Status On-going               Plan - 07/26/17 1356    Clinical Impression Statement No pain reported following treatment today.      Patient will benefit from skilled therapeutic intervention in order to improve the following deficits and impairments:     Visit Diagnosis: Chronic left-sided low back pain without sciatica  Abnormal posture  Problem List Patient Active Problem List   Diagnosis Date Noted  . Neck pain 03/07/2017  . Back pain 03/07/2017  . Arteriosclerosis of right carotid artery 03/07/2017  . Myalgia and myositis 10/30/2015  . Ecchymosis 11/27/2014  . Cough 10/17/2014  . Wellness examination 09/08/2014  . edema 04/04/2014  . Encounter for long-term (current) use of other medications 10/01/2013  . Gout 07/02/2012  . Anemia 07/02/2012  . Homocysteinemia (HCC) 07/02/2012  . Vitamin D deficiency 07/02/2012  . Weight loss 11/24/2011  . Bronchitis, chronic obstructive w acute bronchitis (HCC) 11/13/2011  . Rash 08/16/2011  . History of DVT of lower extremity 07/14/2011  . INSOMNIA 05/31/2010  . SYNCOPE 10/05/2009  . FATIGUE 10/05/2009  . CANDIDIASIS, SKIN 09/28/2009  . HYPERCHOLESTEROLEMIA 02/19/2008  . Obstructive sleep apnea 02/06/2008  . Hypothyroidism 06/06/2007  . Diabetes (HCC) 06/06/2007  . Essential hypertension 06/06/2007  . Seasonal and perennial allergic rhinitis 06/06/2007  . GERD 06/06/2007  . Disorder resulting  from impaired renal function 06/06/2007  . OSTEOARTHRITIS 06/06/2007  . Osteoporosis 06/06/2007    Destiny Flowers, Destiny Flowers MPT 07/26/2017, 1:57 PM  [facility] [address] [place], [place], [address] Phone: [phone]   Fax:  [phone]  Name: Destiny Flowers MRN: [id number] Date of Birth: 1929/05/25

## 2017-08-02 ENCOUNTER — Encounter: Payer: PPO | Admitting: Physical Therapy

## 2017-08-03 ENCOUNTER — Encounter: Payer: PPO | Admitting: Physical Therapy

## 2017-08-09 ENCOUNTER — Encounter: Payer: Self-pay | Admitting: Physical Therapy

## 2017-08-09 ENCOUNTER — Ambulatory Visit: Payer: PPO | Admitting: Physical Therapy

## 2017-08-09 DIAGNOSIS — G8929 Other chronic pain: Secondary | ICD-10-CM

## 2017-08-09 DIAGNOSIS — M545 Low back pain, unspecified: Secondary | ICD-10-CM

## 2017-08-09 DIAGNOSIS — R293 Abnormal posture: Secondary | ICD-10-CM

## 2017-08-09 NOTE — Therapy (Signed)
[facility] [address] [place], [place], [address] Phone: [phone]   Fax:  [phone]  Physical Therapy Treatment  Patient Details  Name: Destiny Flowers MRN: [id number] Date of Birth: [birth year] Referring Provider: Romero Belling  Encounter Date: 08/09/2017      PT End of Session - 08/09/17 1412    Visit Number 9   Number of Visits 16   Date for PT Re-Evaluation 10/06/17   PT Start Time 0100   PT Stop Time 0153   PT Time Calculation (min) 53 min   Activity Tolerance Patient tolerated treatment well   Behavior During Therapy [facility] for tasks assessed/performed      Past Medical History:  Diagnosis Date  . ALLERGIC RHINITIS   . DIABETES MELLITUS, TYPE II 06/06/2007  . DVT (deep venous thrombosis) (HCC)    Left leg  . Dysthymic disorder   . GERD 06/06/2007  . Homocysteinemia (HCC)   . HYPERCHOLESTEROLEMIA 02/19/2008  . HYPERTENSION 06/06/2007  . HYPOTHYROIDISM 06/06/2007  . INSOMNIA   . OBSTRUCTIVE SLEEP APNEA   . OSTEOARTHRITIS   . OSTEOPOROSIS 06/06/2007  . RENAL INSUFFICIENCY 06/06/2007    History reviewed. No pertinent surgical history.  There were no vitals filed for this visit.      Subjective Assessment - 08/09/17 1414    Subjective My back has been hurting a bit more since not coming in last week.   Patient Stated Goals What to be able to get around better with less pain.   Pain Score 3    Pain Location Back   Pain Orientation Lower;Mid   Pain Descriptors / Indicators Dull;Discomfort;Aching   Pain Type Chronic pain   Pain Onset More than a month ago                         [facility] Adult PT Treatment/Exercise - 08/09/17 0001      Exercises   Exercises Knee/Hip     Lumbar Exercises: Aerobic   Stationary Bike Level 4 x 15 minutes.     Programme researcher, broadcasting/film/video Location --  Mid T to L-spine.   Electrical Stimulation Action IFC   Electrical Stimulation Parameters 80-150  Hz x 20 minutes.   Electrical Stimulation Goals Tone;Pain     Manual Therapy   Manual Therapy Soft tissue mobilization   Manual therapy comments STW/M x 9 minutes to affected spinal musculature.                  PT Short Term Goals - 06/08/17 1523      PT SHORT TERM GOAL #1   Title STG's=LTG's.           PT Long Term Goals - 06/14/17 1321      PT LONG TERM GOAL #1   Title Independent with a HEP.   Time 8   Period Weeks   Status On-going     PT LONG TERM GOAL #2   Title Perform ADL's with pain not > 3/10.   Period Weeks   Status On-going     PT LONG TERM GOAL #3   Title Stand 15 minutes with pain not > 3-4/10.   Time 8   Period Weeks   Status On-going               Plan - 08/09/17 1418    Clinical Impression Statement The patient has made good progress thus far with an overall decrease in  her back pain per patient report.  She had a slight rise in pain but contributes that to being out of PT for 2 weeks. She would like to finish out her remaining visits.      Patient will benefit from skilled therapeutic intervention in order to improve the following deficits and impairments:  Pain, Decreased activity tolerance, Postural dysfunction  Visit Diagnosis: Chronic left-sided low back pain without sciatica - Plan: PT plan of care cert/re-cert  Abnormal posture - Plan: PT plan of care cert/re-cert     Problem List Patient Active Problem List   Diagnosis Date Noted  . Neck pain 03/07/2017  . Back pain 03/07/2017  . Arteriosclerosis of right carotid artery 03/07/2017  . Myalgia and myositis 10/30/2015  . Ecchymosis 11/27/2014  . Cough 10/17/2014  . Wellness examination 09/08/2014  . edema 04/04/2014  . Encounter for long-term (current) use of other medications 10/01/2013  . Gout 07/02/2012  . Anemia 07/02/2012  . Homocysteinemia (HCC) 07/02/2012  . Vitamin D deficiency 07/02/2012  . Weight loss 11/24/2011  . Bronchitis, chronic obstructive  w acute bronchitis (HCC) 11/13/2011  . Rash 08/16/2011  . History of DVT of lower extremity 07/14/2011  . INSOMNIA 05/31/2010  . SYNCOPE 10/05/2009  . FATIGUE 10/05/2009  . CANDIDIASIS, SKIN 09/28/2009  . HYPERCHOLESTEROLEMIA 02/19/2008  . Obstructive sleep apnea 02/06/2008  . Hypothyroidism 06/06/2007  . Diabetes (HCC) 06/06/2007  . Essential hypertension 06/06/2007  . Seasonal and perennial allergic rhinitis 06/06/2007  . GERD 06/06/2007  . Disorder resulting from impaired renal function 06/06/2007  . OSTEOARTHRITIS 06/06/2007  . Osteoporosis 06/06/2007    Kermit Arnette, [place] MPT 08/09/2017, 2:23 PM  [facility] [address] [place], [place], [address] Phone: [phone]   Fax:  [phone]  Name: VICI NOVICK MRN: [id number] Date of Birth: 03-07-1929

## 2017-08-16 ENCOUNTER — Ambulatory Visit: Payer: PPO | Attending: Endocrinology | Admitting: Physical Therapy

## 2017-08-16 DIAGNOSIS — M545 Low back pain, unspecified: Secondary | ICD-10-CM

## 2017-08-16 DIAGNOSIS — M549 Dorsalgia, unspecified: Secondary | ICD-10-CM | POA: Insufficient documentation

## 2017-08-16 DIAGNOSIS — R293 Abnormal posture: Secondary | ICD-10-CM | POA: Diagnosis not present

## 2017-08-16 DIAGNOSIS — G8929 Other chronic pain: Secondary | ICD-10-CM

## 2017-08-16 NOTE — Therapy (Signed)
[facility] [address] [place], [place], [address] Phone: [phone]   Fax:  [phone]  Physical Therapy Treatment  Patient Details  Name: Destiny Flowers MRN: [id number] Date of Birth: Sep 04, 1929 Referring Provider: Romero Belling  Encounter Date: 08/16/2017      PT End of Session - 08/16/17 1306    Visit Number 10   Number of Visits 16   Date for PT Re-Evaluation 10/06/17   PT Start Time 0100   PT Stop Time 0152   PT Time Calculation (min) 52 min   Activity Tolerance Patient tolerated treatment well   Behavior During Therapy [facility] for tasks assessed/performed      Past Medical History:  Diagnosis Date  . ALLERGIC RHINITIS   . DIABETES MELLITUS, TYPE II 06/06/2007  . DVT (deep venous thrombosis) (HCC)    Left leg  . Dysthymic disorder   . GERD 06/06/2007  . Homocysteinemia (HCC)   . HYPERCHOLESTEROLEMIA 02/19/2008  . HYPERTENSION 06/06/2007  . HYPOTHYROIDISM 06/06/2007  . INSOMNIA   . OBSTRUCTIVE SLEEP APNEA   . OSTEOARTHRITIS   . OSTEOPOROSIS 06/06/2007  . RENAL INSUFFICIENCY 06/06/2007    No past surgical history on file.  There were no vitals filed for this visit.      Subjective Assessment - 08/16/17 1505    Subjective I dont have much pain while sitting.  When I walk my pain goes to about a 5/10.   Patient Stated Goals Get back to normal and walking good.   Pain Score 3    Pain Location Back                         [facility] Adult PT Treatment/Exercise - 08/16/17 0001      Exercises   Exercises Knee/Hip     Lumbar Exercises: Aerobic   Stationary Bike Level 4 x 15 minutes.     Programme researcher, broadcasting/film/video Location --  Mid-T and L-spine.   Electrical Stimulation Action IFC   Electrical Stimulation Parameters 80-150 Hz x 20 minutes.   Electrical Stimulation Goals Tone;Pain     Manual Therapy   Manual Therapy Soft tissue mobilization   Manual therapy comments STW/M x 10  minutes to affected spinal musculature.                  PT Short Term Goals - 06/08/17 1523      PT SHORT TERM GOAL #1   Title STG's=LTG's.           PT Long Term Goals - 06/14/17 1321      PT LONG TERM GOAL #1   Title Independent with a HEP.   Time 8   Period Weeks   Status On-going     PT LONG TERM GOAL #2   Title Perform ADL's with pain not > 3/10.   Period Weeks   Status On-going     PT LONG TERM GOAL #3   Title Stand 15 minutes with pain not > 3-4/10.   Time 8   Period Weeks   Status On-going               Plan - 08/16/17 1504    Clinical Impression Statement Good improvement thus far with an inproved FOTO score.      Patient will benefit from skilled therapeutic intervention in order to improve the following deficits and impairments:  Pain, Decreased activity tolerance, Postural dysfunction  Visit Diagnosis: Chronic  left-sided low back pain without sciatica  Abnormal posture       G-Codes - 2017-09-03 1306    Functional Assessment Tool Used (Outpatient Only) FOTO...53% limitation.   Functional Limitation Mobility: Walking and moving around   Mobility: Walking and Moving Around Current Status [phone]) At least 40 percent but less than 60 percent impaired, limited or restricted   Mobility: Walking and Moving Around Goal Status [phone]) At least 40 percent but less than 60 percent impaired, limited or restricted      Problem List Patient Active Problem List   Diagnosis Date Noted  . Neck pain 03/07/2017  . Back pain 03/07/2017  . Arteriosclerosis of right carotid artery 03/07/2017  . Myalgia and myositis 10/30/2015  . Ecchymosis 11/27/2014  . Cough 10/17/2014  . Wellness examination 09/08/2014  . edema 04/04/2014  . Encounter for long-term (current) use of other medications 10/01/2013  . Gout 07/02/2012  . Anemia 07/02/2012  . Homocysteinemia (HCC) 07/02/2012  . Vitamin D deficiency 07/02/2012  . Weight loss 11/24/2011  .  Bronchitis, chronic obstructive w acute bronchitis (HCC) 11/13/2011  . Rash 08/16/2011  . History of DVT of lower extremity 07/14/2011  . INSOMNIA 05/31/2010  . SYNCOPE 10/05/2009  . FATIGUE 10/05/2009  . CANDIDIASIS, SKIN 09/28/2009  . HYPERCHOLESTEROLEMIA 02/19/2008  . Obstructive sleep apnea 02/06/2008  . Hypothyroidism 06/06/2007  . Diabetes (HCC) 06/06/2007  . Essential hypertension 06/06/2007  . Seasonal and perennial allergic rhinitis 06/06/2007  . GERD 06/06/2007  . Disorder resulting from impaired renal function 06/06/2007  . OSTEOARTHRITIS 06/06/2007  . Osteoporosis 06/06/2007    Hanaa Payes, [place] MPT 09-03-17, 3:11 PM  [facility] [address] [place], [place], [address] Phone: [phone]   Fax:  [phone]  Name: Destiny Flowers MRN: [id number] Date of Birth: 1929/04/08

## 2017-08-23 ENCOUNTER — Ambulatory Visit: Payer: PPO | Admitting: Physical Therapy

## 2017-08-23 DIAGNOSIS — R293 Abnormal posture: Secondary | ICD-10-CM

## 2017-08-23 DIAGNOSIS — M545 Low back pain: Secondary | ICD-10-CM | POA: Diagnosis not present

## 2017-08-23 DIAGNOSIS — G8929 Other chronic pain: Secondary | ICD-10-CM

## 2017-08-23 NOTE — Therapy (Signed)
[facility] [address] [place], [place], [address] Phone: [phone]   Fax:  [phone]  Physical Therapy Treatment  Patient Details  Name: Destiny Flowers MRN: [id number] Date of Birth: Jan 18, 1929 Referring Provider: Romero Belling  Encounter Date: 08/23/2017      PT End of Session - 08/23/17 1427    Visit Number 11   Number of Visits 16   Date for PT Re-Evaluation 10/06/17   PT Start Time 0142   PT Stop Time 0237   PT Time Calculation (min) 55 min   Activity Tolerance Patient tolerated treatment well   Behavior During Therapy [facility] for tasks assessed/performed      Past Medical History:  Diagnosis Date  . ALLERGIC RHINITIS   . DIABETES MELLITUS, TYPE II 06/06/2007  . DVT (deep venous thrombosis) (HCC)    Left leg  . Dysthymic disorder   . GERD 06/06/2007  . Homocysteinemia (HCC)   . HYPERCHOLESTEROLEMIA 02/19/2008  . HYPERTENSION 06/06/2007  . HYPOTHYROIDISM 06/06/2007  . INSOMNIA   . OBSTRUCTIVE SLEEP APNEA   . OSTEOARTHRITIS   . OSTEOPOROSIS 06/06/2007  . RENAL INSUFFICIENCY 06/06/2007    No past surgical history on file.  There were no vitals filed for this visit.      Subjective Assessment - 08/23/17 1429    Subjective I'm doing good.  I have had a cramp in my leg today though.   Pain Score 2    Pain Location Back   Pain Orientation Left   Pain Descriptors / Indicators Aching;Dull;Discomfort   Pain Type Chronic pain   Pain Onset More than a month ago                         [facility] Adult PT Treatment/Exercise - 08/23/17 0001      Exercises   Exercises Knee/Hip     Lumbar Exercises: Aerobic   Stationary Bike Level 4 x 15 minutes.     Programme researcher, broadcasting/film/video Location --  Mid-T, Lower lumbar   Electrical Stimulation Action IFC   Electrical Stimulation Parameters 80-150 hz x 20 minutes.   Electrical Stimulation Goals Tone;Pain     Manual Therapy   Manual Therapy  Soft tissue mobilization   Manual therapy comments STW/M x 9 minutes to affected spinal musculature.                  PT Short Term Goals - 06/08/17 1523      PT SHORT TERM GOAL #1   Title STG's=LTG's.           PT Long Term Goals - 06/14/17 1321      PT LONG TERM GOAL #1   Title Independent with a HEP.   Time 8   Period Weeks   Status On-going     PT LONG TERM GOAL #2   Title Perform ADL's with pain not > 3/10.   Period Weeks   Status On-going     PT LONG TERM GOAL #3   Title Stand 15 minutes with pain not > 3-4/10.   Time 8   Period Weeks   Status On-going               Plan - 08/23/17 1429    Clinical Impression Statement Improvement upright gait with walker today and increased pain due to less pain.      Patient will benefit from skilled therapeutic intervention in order to improve the  following deficits and impairments:  Pain, Decreased activity tolerance, Postural dysfunction  Visit Diagnosis: Chronic left-sided low back pain without sciatica  Abnormal posture     Problem List Patient Active Problem List   Diagnosis Date Noted  . Neck pain 03/07/2017  . Back pain 03/07/2017  . Arteriosclerosis of right carotid artery 03/07/2017  . Myalgia and myositis 10/30/2015  . Ecchymosis 11/27/2014  . Cough 10/17/2014  . Wellness examination 09/08/2014  . edema 04/04/2014  . Encounter for long-term (current) use of other medications 10/01/2013  . Gout 07/02/2012  . Anemia 07/02/2012  . Homocysteinemia (HCC) 07/02/2012  . Vitamin D deficiency 07/02/2012  . Weight loss 11/24/2011  . Bronchitis, chronic obstructive w acute bronchitis (HCC) 11/13/2011  . Rash 08/16/2011  . History of DVT of lower extremity 07/14/2011  . INSOMNIA 05/31/2010  . SYNCOPE 10/05/2009  . FATIGUE 10/05/2009  . CANDIDIASIS, SKIN 09/28/2009  . HYPERCHOLESTEROLEMIA 02/19/2008  . Obstructive sleep apnea 02/06/2008  . Hypothyroidism 06/06/2007  . Diabetes (HCC)  06/06/2007  . Essential hypertension 06/06/2007  . Seasonal and perennial allergic rhinitis 06/06/2007  . GERD 06/06/2007  . Disorder resulting from impaired renal function 06/06/2007  . OSTEOARTHRITIS 06/06/2007  . Osteoporosis 06/06/2007    Jarel Cuadra, [place] MPT 08/23/2017, 2:38 PM  [facility] [address] [place], [place], [address] Phone: [phone]   Fax:  [phone]  Name: Destiny Flowers MRN: [id number] Date of Birth: 1929-01-24

## 2017-08-30 ENCOUNTER — Ambulatory Visit: Payer: PPO | Admitting: Physical Therapy

## 2017-08-30 DIAGNOSIS — M545 Low back pain: Principal | ICD-10-CM

## 2017-08-30 DIAGNOSIS — G8929 Other chronic pain: Secondary | ICD-10-CM

## 2017-08-30 DIAGNOSIS — R293 Abnormal posture: Secondary | ICD-10-CM

## 2017-08-30 NOTE — Therapy (Signed)
[facility] [address] [place], [place], [address] Phone: [phone]   Fax:  [phone]  Physical Therapy Treatment  Patient Details  Name: Destiny Flowers MRN: [id number] Date of Birth: 07/03/1929 Referring Provider: Romero BellingSean [place]  Encounter Date: 08/30/2017      PT End of Session - 08/30/17 1428    Visit Number 12   Number of Visits 16   Date for PT Re-Evaluation 10/06/17   PT Start Time 0144   PT Stop Time 0237   PT Time Calculation (min) 53 min   Activity Tolerance Patient tolerated treatment well   Behavior [facility] for tasks assessed/performed      Past Medical History:  Diagnosis Date  . ALLERGIC RHINITIS   . DIABETES MELLITUS, TYPE II 06/06/2007  . DVT (deep venous thrombosis) (HCC)    Left leg  . Dysthymic disorder   . GERD 06/06/2007  . Homocysteinemia (HCC)   . HYPERCHOLESTEROLEMIA 02/19/2008  . HYPERTENSION 06/06/2007  . HYPOTHYROIDISM 06/06/2007  . INSOMNIA   . OBSTRUCTIVE SLEEP APNEA   . OSTEOARTHRITIS   . OSTEOPOROSIS 06/06/2007  . RENAL INSUFFICIENCY 06/06/2007    No past surgical history on file.  There were no vitals filed for this visit.      Subjective Assessment - 08/30/17 1428    Subjective I couldn't have made it without this therapy.  My pain now increases when I go shopping mostly in the low back now.   Pain Score 2    Pain Location Back   Pain Orientation Right;Left;Lower   Pain Descriptors / Indicators Aching   Pain Type Chronic pain   Pain Onset More than a month ago   Aggravating Factors  Shopping.                         [facility] - 08/30/17 0001      Exercises   Exercises Knee/Hip     Lumbar Exercises: Aerobic   Stationary Bike Level 4 x 15 minutes.     Moist Heat Therapy   Number Minutes Moist Heat 20 Minutes   Moist Heat Location Lumbar Spine     Electrical Stimulation   Electrical Stimulation Location Low back.   Electrical Stimulation Action IFC   Electrical Stimulation Parameters 80-150 Hz x 20 minutes.   Electrical Stimulation Goals Tone     Manual Therapy   Manual Therapy Soft tissue mobilization   Manual therapy comments STW/m x 8 minutes.                  PT Short Term Goals - 06/08/17 1523      PT SHORT TERM GOAL #1   Title STG's=LTG's.           PT Long Term Goals - 06/14/17 1321      PT LONG TERM GOAL #1   Title Independent with a HEP.   Time 8   Period Weeks   Status On-going     PT LONG TERM GOAL #2   Title Perform ADL's with pain not > 3/10.   Period Weeks   Status On-going     PT LONG TERM GOAL #3   Title Stand 15 minutes with pain not > 3-4/10.   Time 8   Period Weeks   Status On-going               Plan - 08/30/17 1432    Clinical Impression Statement Good  respones to treatment today with focus on lower lumbar region.      Patient will benefit from skilled therapeutic intervention in order to improve the following deficits and impairments:  Pain, Decreased activity tolerance, Postural dysfunction  Visit Diagnosis: Chronic left-sided low back pain without sciatica  Abnormal posture     Problem List Patient Active Problem List   Diagnosis Date Noted  . Neck pain 03/07/2017  . Back pain 03/07/2017  . Arteriosclerosis of right carotid artery 03/07/2017  . Myalgia and myositis 10/30/2015  . Ecchymosis 11/27/2014  . Cough 10/17/2014  . Wellness examination 09/08/2014  . edema 04/04/2014  . Encounter for long-term (current) use of other medications 10/01/2013  . Gout 07/02/2012  . Anemia 07/02/2012  . Homocysteinemia (HCC) 07/02/2012  . Vitamin D deficiency 07/02/2012  . Weight loss 11/24/2011  . Bronchitis, chronic obstructive w acute bronchitis (HCC) 11/13/2011  . Rash 08/16/2011  . History of DVT of lower extremity 07/14/2011  . INSOMNIA 05/31/2010  . SYNCOPE 10/05/2009  . FATIGUE 10/05/2009  . CANDIDIASIS, SKIN  09/28/2009  . HYPERCHOLESTEROLEMIA 02/19/2008  . Obstructive sleep apnea 02/06/2008  . Hypothyroidism 06/06/2007  . Diabetes (HCC) 06/06/2007  . Essential hypertension 06/06/2007  . Seasonal and perennial allergic rhinitis 06/06/2007  . GERD 06/06/2007  . Disorder resulting from impaired renal function 06/06/2007  . OSTEOARTHRITIS 06/06/2007  . Osteoporosis 06/06/2007    Rodarius Kichline, [place] MPT 08/30/2017, 2:37 PM  [facility] [address] [place], [place], [address] Phone: [phone]   Fax:  [phone]  Name: Destiny Flowers MRN: [id number] Date of Birth: 29-Dec-1928

## 2017-09-06 ENCOUNTER — Ambulatory Visit: Payer: PPO | Admitting: Physical Therapy

## 2017-09-06 DIAGNOSIS — M545 Low back pain, unspecified: Secondary | ICD-10-CM

## 2017-09-06 DIAGNOSIS — R293 Abnormal posture: Secondary | ICD-10-CM

## 2017-09-06 DIAGNOSIS — G8929 Other chronic pain: Secondary | ICD-10-CM

## 2017-09-06 DIAGNOSIS — M549 Dorsalgia, unspecified: Secondary | ICD-10-CM

## 2017-09-06 NOTE — Therapy (Signed)
[facility] [address] [place], [place], [address] Phone: [phone]   Fax:  [phone]  Physical Therapy Treatment  Patient Details  Name: Destiny Flowers MRN: [id number] Date of Birth: 01/25/1929 Referring Provider: Romero BellingSean [place]  Encounter Date: 09/06/2017      PT End of Session - 09/06/17 1335    Visit Number 13   Number of Visits 16   Date for PT Re-Evaluation 10/06/17   PT Start Time 1255   PT Stop Time 1348   PT Time Calculation (min) 53 min   Activity Tolerance Patient tolerated treatment well   Behavior During Therapy [facility] for tasks assessed/performed      Past Medical History:  Diagnosis Date  . ALLERGIC RHINITIS   . DIABETES MELLITUS, TYPE II 06/06/2007  . DVT (deep venous thrombosis) (HCC)    Left leg  . Dysthymic disorder   . GERD 06/06/2007  . Homocysteinemia (HCC)   . HYPERCHOLESTEROLEMIA 02/19/2008  . HYPERTENSION 06/06/2007  . HYPOTHYROIDISM 06/06/2007  . INSOMNIA   . OBSTRUCTIVE SLEEP APNEA   . OSTEOARTHRITIS   . OSTEOPOROSIS 06/06/2007  . RENAL INSUFFICIENCY 06/06/2007    No past surgical history on file.  There were no vitals filed for this visit.      Subjective Assessment - 09/06/17 1340    Subjective I'm much better.  I might skip a week.   Pain Score 1    Pain Location Back   Pain Orientation Right;Left;Lower   Pain Descriptors / Indicators Aching                         [facility] Adult PT Treatment/Exercise - 09/06/17 0001      Exercises   Exercises Knee/Hip     Lumbar Exercises: Aerobic   Stationary Bike Level 5 x 17 minutes.     Moist Heat Therapy   Number Minutes Moist Heat 20 Minutes   Moist Heat Location Lumbar Spine     Electrical Stimulation   Electrical Stimulation Location Low back.   Electrical Stimulation Action Pre-mod over bilateral SIJ's.   Electrical Stimulation Parameters 80-150 hz x 20 minutes.   Electrical Stimulation Goals Pain     Manual Therapy    Manual Therapy Soft tissue mobilization   Manual therapy comments STW/M x 7 minutes.                  PT Short Term Goals - 06/08/17 1523      PT SHORT TERM GOAL #1   Title STG's=LTG's.           PT Long Term Goals - 06/14/17 1321      PT LONG TERM GOAL #1   Title Independent with a HEP.   Time 8   Period Weeks   Status On-going     PT LONG TERM GOAL #2   Title Perform ADL's with pain not > 3/10.   Period Weeks   Status On-going     PT LONG TERM GOAL #3   Title Stand 15 minutes with pain not > 3-4/10.   Time 8   Period Weeks   Status On-going               Plan - 09/06/17 1345    Clinical Impression Statement Patient has responded well to treatment with miniaml pain today.  CC is tenderness over her left SIJ.      Patient will benefit from skilled therapeutic intervention in order  to improve the following deficits and impairments:     Visit Diagnosis: Chronic left-sided low back pain without sciatica  Chronic midline back pain, unspecified back location  Abnormal posture     Problem List Patient Active Problem List   Diagnosis Date Noted  . Neck pain 03/07/2017  . Back pain 03/07/2017  . Arteriosclerosis of right carotid artery 03/07/2017  . Myalgia and myositis 10/30/2015  . Ecchymosis 11/27/2014  . Cough 10/17/2014  . Wellness examination 09/08/2014  . edema 04/04/2014  . Encounter for long-term (current) use of other medications 10/01/2013  . Gout 07/02/2012  . Anemia 07/02/2012  . Homocysteinemia (HCC) 07/02/2012  . Vitamin D deficiency 07/02/2012  . Weight loss 11/24/2011  . Bronchitis, chronic obstructive w acute bronchitis (HCC) 11/13/2011  . Rash 08/16/2011  . History of DVT of lower extremity 07/14/2011  . INSOMNIA 05/31/2010  . SYNCOPE 10/05/2009  . FATIGUE 10/05/2009  . CANDIDIASIS, SKIN 09/28/2009  . HYPERCHOLESTEROLEMIA 02/19/2008  . Obstructive sleep apnea 02/06/2008  . Hypothyroidism 06/06/2007  .  Diabetes (HCC) 06/06/2007  . Essential hypertension 06/06/2007  . Seasonal and perennial allergic rhinitis 06/06/2007  . GERD 06/06/2007  . Disorder resulting from impaired renal function 06/06/2007  . OSTEOARTHRITIS 06/06/2007  . Osteoporosis 06/06/2007    Ontario Pettengill, [place] 09/06/2017, 1:48 PM  [facility] [address] [place], [place], [address] Phone: [phone]   Fax:  [phone]  Name: Destiny Flowers MRN: [id number] Date of Birth: Jan 24, 1929

## 2017-09-20 ENCOUNTER — Encounter: Payer: Self-pay | Admitting: Physical Therapy

## 2017-09-20 ENCOUNTER — Ambulatory Visit: Payer: PPO | Attending: Endocrinology | Admitting: Physical Therapy

## 2017-09-20 DIAGNOSIS — M545 Low back pain, unspecified: Secondary | ICD-10-CM

## 2017-09-20 DIAGNOSIS — M549 Dorsalgia, unspecified: Secondary | ICD-10-CM | POA: Diagnosis not present

## 2017-09-20 DIAGNOSIS — G8929 Other chronic pain: Secondary | ICD-10-CM | POA: Insufficient documentation

## 2017-09-20 DIAGNOSIS — R293 Abnormal posture: Secondary | ICD-10-CM | POA: Insufficient documentation

## 2017-09-20 NOTE — Therapy (Addendum)
[facility] [place], [place], [address] Phone: [phone]   Fax:  [phone]  Physical Therapy Treatment  Patient Details  Name: Destiny Flowers MRN: [id number] Date of Birth: 01-31-1929 Referring Provider: Renato Shin   Encounter Date: 09/20/2017  PT End of Session - 09/20/17 1556    Visit Number  14    Number of Visits  16    Date for PT Re-Evaluation  10/06/17    PT Start Time  0100    PT Stop Time  0152    PT Time Calculation (min)  52 min    Activity Tolerance  Patient tolerated treatment well    Behavior During Therapy  [facility] for tasks assessed/performed       Past Medical History:  Diagnosis Date  . ALLERGIC RHINITIS   . DIABETES MELLITUS, TYPE II 06/06/2007  . DVT (deep venous thrombosis) (HCC)    Left leg  . Dysthymic disorder   . GERD 06/06/2007  . Homocysteinemia ([place])   . HYPERCHOLESTEROLEMIA 02/19/2008  . HYPERTENSION 06/06/2007  . HYPOTHYROIDISM 06/06/2007  . INSOMNIA   . OBSTRUCTIVE SLEEP APNEA   . OSTEOARTHRITIS   . OSTEOPOROSIS 06/06/2007  . RENAL INSUFFICIENCY 06/06/2007    History reviewed. No pertinent surgical history.  There were no vitals filed for this visit.  Subjective Assessment - 09/20/17 1601    Subjective  These treatments have really helped me.  I don't think I'd be walking without them.                      [facility] Adult PT Treatment/Exercise - 09/20/17 0001      Exercises   Exercises  Knee/Hip      Lumbar Exercises: Aerobic   Stationary Bike  Level 4 x 15 minutes.      Modalities   Modalities  Electrical Stimulation;Moist Heat      Moist Heat Therapy   Number Minutes Moist Heat  20 Minutes    Moist Heat Location  Lumbar Spine      Electrical Stimulation   Electrical Stimulation Location  Low back.    Electrical Stimulation Action  IFC to lower lumbar region.    Electrical Stimulation Parameters  80-150 hz x 20 minutes.    Electrical Stimulation Goals   Pain      Manual Therapy   Manual Therapy  Soft tissue mobilization    Manual therapy comments  STW/M x 10 minutes with focus on left SIJ region.               PT Short Term Goals - 06/08/17 1523      PT SHORT TERM GOAL #1   Title  STG's=LTG's.        PT Long Term Goals - 06/14/17 1321      PT LONG TERM GOAL #1   Title  Independent with a HEP.    Time  8    Period  Weeks    Status  On-going      PT LONG TERM GOAL #2   Title  Perform ADL's with pain not > 3/10.    Period  Weeks    Status  On-going      PT LONG TERM GOAL #3   Title  Stand 15 minutes with pain not > 3-4/10.    Time  8    Period  Weeks    Status  On-going  Plan - 09/20/17 1548    Clinical Impression Statement  Pain is now very localized to the left SIJ region.    PT Treatment/Interventions  ADLs/Self Care Home Management;Cryotherapy;Electrical Stimulation;Moist Heat;Ultrasound;Functional mobility training;Patient/family education;Therapeutic exercise;Therapeutic activities;Manual techniques;Dry needling       Patient will benefit from skilled therapeutic intervention in order to improve the following deficits and impairments:  Pain, Decreased activity tolerance, Postural dysfunction  Visit Diagnosis: Chronic midline back pain, unspecified back location  Chronic left-sided low back pain without sciatica  Abnormal posture     Problem List Patient Active Problem List   Diagnosis Date Noted  . Neck pain 03/07/2017  . Back pain 03/07/2017  . Arteriosclerosis of right carotid artery 03/07/2017  . Myalgia and myositis 10/30/2015  . Ecchymosis 11/27/2014  . Cough 10/17/2014  . Wellness examination 09/08/2014  . edema 04/04/2014  . Encounter for long-term (current) use of other medications 10/01/2013  . Gout 07/02/2012  . Anemia 07/02/2012  . Homocysteinemia ([place]) 07/02/2012  . Vitamin D deficiency 07/02/2012  . Weight loss 11/24/2011  . Bronchitis, chronic obstructive  w acute bronchitis ([place]) 11/13/2011  . Rash 08/16/2011  . History of DVT of lower extremity 07/14/2011  . INSOMNIA 05/31/2010  . SYNCOPE 10/05/2009  . FATIGUE 10/05/2009  . CANDIDIASIS, SKIN 09/28/2009  . HYPERCHOLESTEROLEMIA 02/19/2008  . Obstructive sleep apnea 02/06/2008  . Hypothyroidism 06/06/2007  . Diabetes ([employer]) 06/06/2007  . Essential hypertension 06/06/2007  . Seasonal and perennial allergic rhinitis 06/06/2007  . GERD 06/06/2007  . Disorder resulting from impaired renal function 06/06/2007  . OSTEOARTHRITIS 06/06/2007  . Osteoporosis 06/06/2007    Ryley Teater, [place] MPT 09/20/2017, 4:01 PM  [facility] [address] [place], [place], [address] Phone: [phone]   Fax:  [phone]  Name: Destiny Flowers MRN: [id number] Date of Birth: 20-Aug-1929  PHYSICAL THERAPY DISCHARGE SUMMARY  Visits from Start of Care: 14.  Current functional level related to goals / functional outcomes: See above.   Remaining deficits: Good response to treatments.  Patient not returning.   Education / Equipment: HEP. Plan: Patient agrees to discharge.  Patient goals were not met. Patient is being discharged due to not returning since the last visit.  ?????         Mali Miyeko Mahlum MPT

## 2017-10-02 ENCOUNTER — Encounter: Payer: PPO | Admitting: Physical Therapy

## 2017-11-06 ENCOUNTER — Other Ambulatory Visit: Payer: Self-pay | Admitting: Endocrinology

## 2017-11-29 ENCOUNTER — Encounter: Payer: Self-pay | Admitting: Endocrinology

## 2017-11-29 ENCOUNTER — Ambulatory Visit (INDEPENDENT_AMBULATORY_CARE_PROVIDER_SITE_OTHER): Payer: PPO | Admitting: Endocrinology

## 2017-11-29 VITALS — BP 162/78 | HR 74 | Wt 192.6 lb

## 2017-11-29 DIAGNOSIS — E1122 Type 2 diabetes mellitus with diabetic chronic kidney disease: Secondary | ICD-10-CM | POA: Diagnosis not present

## 2017-11-29 DIAGNOSIS — H9190 Unspecified hearing loss, unspecified ear: Secondary | ICD-10-CM | POA: Diagnosis not present

## 2017-11-29 DIAGNOSIS — M549 Dorsalgia, unspecified: Secondary | ICD-10-CM

## 2017-11-29 DIAGNOSIS — M1A9XX0 Chronic gout, unspecified, without tophus (tophi): Secondary | ICD-10-CM | POA: Diagnosis not present

## 2017-11-29 DIAGNOSIS — G8929 Other chronic pain: Secondary | ICD-10-CM | POA: Diagnosis not present

## 2017-11-29 DIAGNOSIS — M810 Age-related osteoporosis without current pathological fracture: Secondary | ICD-10-CM

## 2017-11-29 DIAGNOSIS — Z0000 Encounter for general adult medical examination without abnormal findings: Secondary | ICD-10-CM

## 2017-11-29 LAB — CBC WITH DIFFERENTIAL/PLATELET
BASOS PCT: 0.7 % (ref 0.0–3.0)
Basophils Absolute: 0 10*3/uL (ref 0.0–0.1)
Eosinophils Absolute: 0.1 10*3/uL (ref 0.0–0.7)
Eosinophils Relative: 1.5 % (ref 0.0–5.0)
HCT: 38.9 % (ref 36.0–46.0)
HEMOGLOBIN: 12.7 g/dL (ref 12.0–15.0)
Lymphocytes Relative: 19.4 % (ref 12.0–46.0)
Lymphs Abs: 1.4 10*3/uL (ref 0.7–4.0)
MCHC: 32.5 g/dL (ref 30.0–36.0)
MCV: 89.5 fl (ref 78.0–100.0)
Monocytes Absolute: 0.6 10*3/uL (ref 0.1–1.0)
Monocytes Relative: 7.9 % (ref 3.0–12.0)
Neutro Abs: 5 10*3/uL (ref 1.4–7.7)
Neutrophils Relative %: 70.5 % (ref 43.0–77.0)
Platelets: 274 10*3/uL (ref 150.0–400.0)
RBC: 4.34 Mil/uL (ref 3.87–5.11)
RDW: 14.1 % (ref 11.5–15.5)
WBC: 7.1 10*3/uL (ref 4.0–10.5)

## 2017-11-29 LAB — URINALYSIS, ROUTINE W REFLEX MICROSCOPIC
Bilirubin Urine: NEGATIVE
HGB URINE DIPSTICK: NEGATIVE
NITRITE: NEGATIVE
RBC / HPF: NONE SEEN (ref 0–?)
SPECIFIC GRAVITY, URINE: 1.02 (ref 1.000–1.030)
Total Protein, Urine: NEGATIVE
URINE GLUCOSE: NEGATIVE
Urobilinogen, UA: 0.2 (ref 0.0–1.0)
pH: 6 (ref 5.0–8.0)

## 2017-11-29 LAB — MICROALBUMIN / CREATININE URINE RATIO
CREATININE, U: 135 mg/dL
MICROALB/CREAT RATIO: 0.7 mg/g (ref 0.0–30.0)
Microalb, Ur: 1 mg/dL (ref 0.0–1.9)

## 2017-11-29 LAB — LIPID PANEL
CHOLESTEROL: 151 mg/dL (ref 0–200)
HDL: 50.9 mg/dL (ref >39.00)
LDL Cholesterol: 76 mg/dL (ref 0–99)
NonHDL: 100.28
Total CHOL/HDL Ratio: 3
Triglycerides: 123 mg/dL (ref 0.0–149.0)
VLDL: 24.6 mg/dL (ref 0.0–40.0)

## 2017-11-29 LAB — BASIC METABOLIC PANEL
BUN: 22 mg/dL (ref 6–23)
CO2: 29 mEq/L (ref 19–32)
Calcium: 9.7 mg/dL (ref 8.4–10.5)
Chloride: 102 mEq/L (ref 96–112)
Creatinine, Ser: 0.94 mg/dL (ref 0.40–1.20)
GFR: 59.92 mL/min — AB (ref >60.00)
Glucose, Bld: 105 mg/dL — ABNORMAL HIGH (ref 70–99)
POTASSIUM: 4 meq/L (ref 3.5–5.1)
SODIUM: 139 meq/L (ref 135–145)

## 2017-11-29 LAB — TSH: TSH: 1.1 u[IU]/mL (ref 0.35–4.50)

## 2017-11-29 LAB — HEPATIC FUNCTION PANEL
ALT: 10 U/L (ref 0–35)
AST: 18 U/L (ref 0–37)
Albumin: 4 g/dL (ref 3.5–5.2)
Alkaline Phosphatase: 37 U/L — ABNORMAL LOW (ref 39–117)
Bilirubin, Direct: 0 mg/dL (ref 0.0–0.3)
Total Bilirubin: 0.4 mg/dL (ref 0.2–1.2)
Total Protein: 6.9 g/dL (ref 6.0–8.3)

## 2017-11-29 LAB — URIC ACID: Uric Acid, Serum: 5.3 mg/dL (ref 2.4–7.0)

## 2017-11-29 LAB — POCT GLYCOSYLATED HEMOGLOBIN (HGB A1C): Hemoglobin A1C: 6.5

## 2017-11-29 LAB — VITAMIN D 25 HYDROXY (VIT D DEFICIENCY, FRACTURES): VITD: 63.15 ng/mL (ref 30.00–100.00)

## 2017-11-29 NOTE — Progress Notes (Signed)
Subjective:    Patient ID: Destiny Flowers, female    DOB: [birth year], 84 y.o.   MRN: [id number]  HPI Pt is here for regular wellness examination, and is feeling pretty well in general, and says chronic med probs are stable, except as noted below.  Past Medical History:  Diagnosis Date  . ALLERGIC RHINITIS   . DIABETES MELLITUS, TYPE II 06/06/2007  . DVT (deep venous thrombosis) (HCC)    Left leg  . Dysthymic disorder   . GERD 06/06/2007  . Homocysteinemia (HCC)   . HYPERCHOLESTEROLEMIA 02/19/2008  . HYPERTENSION 06/06/2007  . HYPOTHYROIDISM 06/06/2007  . INSOMNIA   . OBSTRUCTIVE SLEEP APNEA   . OSTEOARTHRITIS   . OSTEOPOROSIS 06/06/2007  . RENAL INSUFFICIENCY 06/06/2007    History reviewed. No pertinent surgical history.  Social History   Socioeconomic History  . Marital status: Single    Spouse name: Not on file  . Number of children: Not on file  . Years of education: Not on file  . Highest education level: Not on file  Social Needs  . Financial resource strain: Not on file  . Food insecurity - worry: Not on file  . Food insecurity - inability: Not on file  . Transportation needs - medical: Not on file  . Transportation needs - non-medical: Not on file  Occupational History  . Not on file  Tobacco Use  . Smoking status: Passive Smoke Exposure - Never Smoker  . Smokeless tobacco: Never Used  Substance and Sexual Activity  . Alcohol use: No  . Drug use: No  . Sexual activity: Not on file  Other Topics Concern  . Not on file  Social History Narrative  . Not on file    Current Outpatient Medications on File Prior to Visit  Medication Sig Dispense Refill  . allopurinol (ZYLOPRIM) 100 MG tablet Take 1 tablet (100 mg total) by mouth daily. 30 tablet 6  . aspirin 81 MG tablet Take 81 mg by mouth daily.      . folic acid (FOLVITE) 800 MCG tablet Take 800 mcg by mouth daily.    . furosemide (LASIX) 20 MG tablet TAKE ONE TABLET BY MOUTH ONCE DAILY 90 tablet 1  . glucose  blood (ONETOUCH VERIO) test strip 1 each by Other route daily. And lancets 1/day 100 each 12  . levothyroxine (SYNTHROID, LEVOTHROID) 100 MCG tablet TAKE 1 TABLET BY MOUTH ONCE DAILY BEFORE BREAKFAST 90 tablet 1  . losartan (COZAAR) 100 MG tablet TAKE ONE TABLET BY MOUTH ONCE DAILY 90 tablet 3  . metFORMIN (GLUCOPHAGE-XR) 500 MG 24 hr tablet TAKE 4 TABLETS BY MOUTH IN THE MORNING 360 tablet 1  . Multiple Vitamin (MULTIVITAMIN) tablet Take 1 tablet by mouth daily.      Marland Kitchen omeprazole (PRILOSEC OTC) 20 MG tablet Take 20 mg by mouth daily.    . traMADol-acetaminophen (ULTRACET) 37.5-325 MG per tablet Take 1 tablet by mouth as needed. For pain 30 tablet 2  . traZODone (DESYREL) 100 MG tablet Take 1 tablet (100 mg total) by mouth daily with breakfast. 30 tablet 11   No current facility-administered medications on file prior to visit.     Allergies  Allergen Reactions  . Januvia [Sitagliptin]     falls  . Penicillins Other (See Comments)    Unk    Family History  Problem Relation Age of Onset  . Stroke Mother   . Coronary artery disease Father     BP (!) 162/78 (BP Location:  Left Arm, Patient Position: Sitting, Cuff Size: Normal)   Pulse 74   Wt 192 lb 9.6 oz (87.4 kg)   SpO2 92%   BMI 33.06 kg/m    Review of Systems Denies fever, fatigue, diplopia, earache, chest pain, sob, anxiety, cold intolerance, BRBPR, hematuria, syncope, numbness, allergy sxs, easy bruising, and rash.  She has chronic low-back pain.    Objective:   Physical Exam VS: see vs page GEN: no distress HEAD: head: no deformity eyes: no periorbital swelling, no proptosis external nose and ears are normal mouth: no lesion seen NECK: supple, thyroid is not enlarged CHEST WALL: no deformity LUNGS:  Clear to auscultation CV: reg rate and rhythm, no murmur.  ABD: abdomen is soft, nontender.  no hepatosplenomegaly.  not distended.  no hernia MUSCULOSKELETAL: muscle bulk and strength are grossly normal.  no obvious  joint swelling.  gait is steady with a walker EXTEMITIES: no deformity.  no ulcer on the feet.  feet are of normal color and temp.  1+ bilat leg edema (trace on the right).   PULSES: dorsalis pedis intact bilat.  no carotid bruit NEURO:  cn 2-12 grossly intact.   readily moves all 4's.  SKIN:  Normal texture and temperature.  No rash or suspicious lesion is visible.   NODES:  None palpable at the neck.   PSYCH: alert, well-oriented.  Does not appear anxious nor depressed.   I personally reviewed electrocardiogram tracing (today): Indication: DM Impression: NSR.  No MI. Low voltage; repolarization variant.   Compared to 2017: these changes are new     Assessment & Plan:  Wellness visit today, with problems stable, except as noted.  Subjective:   Patient here for Medicare annual wellness visit and management of other chronic and acute problems.     Risk factors: advanced age    Roster of Physicians Providing Medical Care to Patient:  See "snapshot"   Activities of Daily Living: In your present state of health, do you have any difficulty performing the following activities (lives alone)?:  Preparing food and eating?: No  Bathing yourself: No  Getting dressed: No  Using the toilet:No  Moving around from place to place: No  In the past year have you fallen or had a near fall?: No    Home Safety: Has smoke detector and wears seat belts. firearms are safely stored. No excess sun exposure.    Opioid Use: none  Diet and Exercise  Current exercise habits: limited by health problems.  Dietary issues discussed: healthy diet   Depression Screen  Q1: Over the past two weeks, have you felt down, depressed or hopeless? no  Q2: Over the past two weeks, have you felt little interest or pleasure in doing things? no   The following portions of the patient's history were reviewed and updated as appropriate: allergies, current medications, past family history, past medical history, past social  history, past surgical history and problem list.   Review of Systems  No change in chronic oss, and visual loss.   Objective:   Vision:  [facility]atore[facility] opthalmologist, so he declines [facility] today Hearing: grossly normal Body mass index:  See vs page Msk: pt slowly performs "get-up-and-go" from a sitting position Cognitive Impairment Assessment: cognition, memory and judgment appear normal.  remembers 3/3 at 5 minutes.  excellent recall.  can easily read and write a sentence.  alert and oriented x 3   Assessment:   Medicare wellness utd on preventive parameters.    Plan:  During the course of the visit the patient was educated and counseled about appropriate screening and preventive services including:        Fall prevention is advised today  Screening mammography is not indicated Bone densitometry screening is ordered today Diabetes screening is not needed Nutrition counseling is offered  advanced directives/end of life addressed today:  see healthcare directives hyperlink.   Vaccines are updated as needed  Patient Instructions (the written plan) was given to the patient.

## 2017-11-29 NOTE — Patient Instructions (Addendum)
Please consider these measures for your health:  minimize alcohol.  Do not use tobacco products.  Have a colonoscopy at least every 10 years from age 82.  Women should have an annual mammogram from age 82.  Keep firearms safely stored.  Always use seat belts.  have working smoke alarms in your home.  See an eye doctor and dentist regularly.  Never drive under the influence of alcohol or drugs (including prescription drugs).  Those with fair skin should take precautions against the sun, and should carefully examine their skin once per month, for any new or changed moles. It is critically important to prevent falling down (keep floor areas well-lit, dry, and free of loose objects.  If you have a cane, walker, or wheelchair, you should use it, even for short trips around the house.  Wear flat-soled shoes.  Also, try not to rush).  good diet and exercise significantly improve the control of your diabetes.  please let me know if you wish to be referred to a dietician.  high blood sugar is very risky to your health.  you should see an eye doctor and dentist every year.  It is very important to get all recommended vaccinations.  blood tests are requested for you today.  We'll let you know about the results.  Please come back for a follow-up appointment in 6 months.

## 2017-11-29 NOTE — Progress Notes (Signed)
we discussed code status.  pt requests full code, but would not want to be started or maintained on artificial life-support measures if there was not a reasonable chance of recovery 

## 2017-12-01 LAB — PTH, INTACT AND CALCIUM
CALCIUM: 9.9 mg/dL (ref 8.6–10.4)
PTH: 31 pg/mL (ref 14–64)

## 2017-12-05 ENCOUNTER — Inpatient Hospital Stay: Admission: RE | Admit: 2017-12-05 | Payer: PPO | Source: Ambulatory Visit

## 2017-12-06 ENCOUNTER — Other Ambulatory Visit: Payer: Self-pay | Admitting: Endocrinology

## 2018-03-06 DIAGNOSIS — L84 Corns and callosities: Secondary | ICD-10-CM | POA: Diagnosis not present

## 2018-03-06 DIAGNOSIS — E1151 Type 2 diabetes mellitus with diabetic peripheral angiopathy without gangrene: Secondary | ICD-10-CM | POA: Diagnosis not present

## 2018-03-06 DIAGNOSIS — M79676 Pain in unspecified toe(s): Secondary | ICD-10-CM | POA: Diagnosis not present

## 2018-03-06 DIAGNOSIS — B351 Tinea unguium: Secondary | ICD-10-CM | POA: Diagnosis not present

## 2018-04-05 DIAGNOSIS — E119 Type 2 diabetes mellitus without complications: Secondary | ICD-10-CM | POA: Diagnosis not present

## 2018-04-05 DIAGNOSIS — H524 Presbyopia: Secondary | ICD-10-CM | POA: Diagnosis not present

## 2018-04-05 DIAGNOSIS — H04123 Dry eye syndrome of bilateral lacrimal glands: Secondary | ICD-10-CM | POA: Diagnosis not present

## 2018-04-05 DIAGNOSIS — Z961 Presence of intraocular lens: Secondary | ICD-10-CM | POA: Diagnosis not present

## 2018-04-24 DIAGNOSIS — H6123 Impacted cerumen, bilateral: Secondary | ICD-10-CM | POA: Diagnosis not present

## 2018-04-24 DIAGNOSIS — H9113 Presbycusis, bilateral: Secondary | ICD-10-CM | POA: Diagnosis not present

## 2018-05-04 DIAGNOSIS — H903 Sensorineural hearing loss, bilateral: Secondary | ICD-10-CM | POA: Diagnosis not present

## 2018-05-07 ENCOUNTER — Other Ambulatory Visit: Payer: Self-pay | Admitting: Endocrinology

## 2018-05-12 ENCOUNTER — Other Ambulatory Visit: Payer: Self-pay | Admitting: Endocrinology

## 2018-05-22 ENCOUNTER — Other Ambulatory Visit: Payer: Self-pay | Admitting: Endocrinology

## 2018-05-29 ENCOUNTER — Encounter: Payer: Self-pay | Admitting: Endocrinology

## 2018-05-29 ENCOUNTER — Ambulatory Visit (INDEPENDENT_AMBULATORY_CARE_PROVIDER_SITE_OTHER): Payer: PPO | Admitting: Endocrinology

## 2018-05-29 VITALS — BP 122/62 | HR 67 | Ht 64.5 in | Wt 200.0 lb

## 2018-05-29 DIAGNOSIS — E1122 Type 2 diabetes mellitus with diabetic chronic kidney disease: Secondary | ICD-10-CM

## 2018-05-29 LAB — POCT GLYCOSYLATED HEMOGLOBIN (HGB A1C): HEMOGLOBIN A1C: 6.7 % — AB (ref 4.0–5.6)

## 2018-05-29 NOTE — Progress Notes (Signed)
   Subjective:    Patient ID: Destiny Flowers, female    DOB: 01/17/1929, 84 y.o.   MRN: [id number]  HPI    Review of Systems     Objective:   Physical Exam        Assessment & Plan:

## 2018-05-29 NOTE — Patient Instructions (Signed)
Please continue the same medications.   Please come back for a follow-up appointment in 6 months.    

## 2018-05-29 NOTE — Progress Notes (Signed)
Subjective:    Patient ID: Destiny Flowers, female    DOB: Mar 18, 1929, 84 y.o.   MRN: [id number]  HPI Pt returns for f/u of diabetes mellitus: DM type: 2 Dx'ed: 1997 Complications: none Therapy: metformin.  GDM: G0 DKA: never Severe hypoglycemia: never.  Pancreatitis: never.  Other: pt stopped [place], as she said it was causing her to fall.   Interval history: pt states she feels well in general, except for weight gain.  She takes meforfmin as rx'ed Past Medical History:  Diagnosis Date  . ALLERGIC RHINITIS   . DIABETES MELLITUS, TYPE II 06/06/2007  . DVT (deep venous thrombosis) (HCC)    Left leg  . Dysthymic disorder   . GERD 06/06/2007  . Homocysteinemia (HCC)   . HYPERCHOLESTEROLEMIA 02/19/2008  . HYPERTENSION 06/06/2007  . HYPOTHYROIDISM 06/06/2007  . INSOMNIA   . OBSTRUCTIVE SLEEP APNEA   . OSTEOARTHRITIS   . OSTEOPOROSIS 06/06/2007  . RENAL INSUFFICIENCY 06/06/2007    No past surgical history on file.  Social History   Socioeconomic History  . Marital status: Single    Spouse name: Not on file  . Number of children: Not on file  . Years of education: Not on file  . Highest education level: Not on file  Occupational History  . Not on file  Social Needs  . Financial resource strain: Not on file  . Food insecurity:    Worry: Not on file    Inability: Not on file  . Transportation needs:    Medical: Not on file    Non-medical: Not on file  Tobacco Use  . Smoking status: Passive Smoke Exposure - Never Smoker  . Smokeless tobacco: Never Used  Substance and Sexual Activity  . Alcohol use: No  . Drug use: No  . Sexual activity: Not on file  Lifestyle  . Physical activity:    Days per week: Not on file    Minutes per session: Not on file  . Stress: Not on file  Relationships  . Social connections:    Talks on phone: Not on file    Gets together: Not on file    Attends religious service: Not on file    Active member of club or organization: Not on file      Attends meetings of clubs or organizations: Not on file    Relationship status: Not on file  . Intimate partner violence:    Fear of current or ex partner: Not on file    Emotionally abused: Not on file    Physically abused: Not on file    Forced sexual activity: Not on file  Other Topics Concern  . Not on file  Social History Narrative  . Not on file    Current Outpatient Medications on File Prior to Visit  Medication Sig Dispense Refill  . allopurinol (ZYLOPRIM) 100 MG tablet Take 1 tablet (100 mg total) by mouth daily. 30 tablet 6  . aspirin 81 MG tablet Take 81 mg by mouth daily.      . folic acid (FOLVITE) 800 MCG tablet Take 800 mcg by mouth daily.    . furosemide (LASIX) 20 MG tablet TAKE 1 TABLET BY MOUTH ONCE DAILY 90 tablet 1  . glucose blood (ONETOUCH VERIO) test strip 1 each by Other route daily. And lancets 1/day 100 each 12  . levothyroxine (SYNTHROID, LEVOTHROID) 100 MCG tablet TAKE 1 TABLET BY MOUTH ONCE DAILY BEFORE BREAKFAST 90 tablet 1  . losartan (COZAAR) 100 MG  tablet TAKE 1 TABLET BY MOUTH ONCE DAILY 90 tablet 3  . metFORMIN (GLUCOPHAGE-XR) 500 MG 24 hr tablet TAKE 4 TABLETS BY MOUTH IN THE MORNING 360 tablet 1  . Multiple Vitamin (MULTIVITAMIN) tablet Take 1 tablet by mouth daily.      Marland Kitchen. omeprazole (PRILOSEC OTC) 20 MG tablet Take 20 mg by mouth daily.    . traZODone (DESYREL) 100 MG tablet Take 1 tablet (100 mg total) by mouth daily with breakfast. 30 tablet 11   No current facility-administered medications on file prior to visit.     Allergies  Allergen Reactions  . Januvia [Sitagliptin]     falls  . Penicillins Other (See Comments)    Unk    Family History  Problem Relation Age of Onset  . Stroke Mother   . Coronary artery disease Father     BP 122/62   Pulse 67   Ht 5' 4.5" (1.638 m)   Wt 200 lb (90.7 kg)   SpO2 95%   BMI 33.80 kg/m   Review of Systems Denies chest pain and sob.      Objective:   Physical Exam VITAL SIGNS:  See  vs page GENERAL: no distress Pulses: foot pulses are intact bilaterally.   MSK: no deformity of the feet or ankles.  CV: 1+ bilat edema of the legs or ankles Skin:  no ulcer on the feet or ankles.  normal color and temp on the feet and ankles Neuro: sensation is intact to touch on the feet and ankles, but decreased from normal.    Lab Results  Component Value Date   HGBA1C 6.7 (A) 05/29/2018   Lab Results  Component Value Date   CREATININE 0.94 11/29/2017   BUN 22 11/29/2017   NA 139 11/29/2017   K 4.0 11/29/2017   CL 102 11/29/2017   CO2 29 11/29/2017   Lab Results  Component Value Date   LABURIC 5.3 11/29/2017   Lab Results  Component Value Date   WBC 7.1 11/29/2017   HGB 12.7 11/29/2017   HCT 38.9 11/29/2017   MCV 89.5 11/29/2017   PLT 274.0 11/29/2017       Assessment & Plan:  Type 2 DM: well-controlled.  Edema: this limits rx options.    Patient Instructions  Please continue the same medications.   Please come back for a follow-up appointment in 6 months.

## 2018-06-08 DIAGNOSIS — H903 Sensorineural hearing loss, bilateral: Secondary | ICD-10-CM | POA: Diagnosis not present

## 2018-06-12 DIAGNOSIS — B351 Tinea unguium: Secondary | ICD-10-CM | POA: Diagnosis not present

## 2018-06-12 DIAGNOSIS — L84 Corns and callosities: Secondary | ICD-10-CM | POA: Diagnosis not present

## 2018-06-12 DIAGNOSIS — E1151 Type 2 diabetes mellitus with diabetic peripheral angiopathy without gangrene: Secondary | ICD-10-CM | POA: Diagnosis not present

## 2018-06-12 DIAGNOSIS — M79676 Pain in unspecified toe(s): Secondary | ICD-10-CM | POA: Diagnosis not present

## 2018-06-23 ENCOUNTER — Other Ambulatory Visit: Payer: Self-pay | Admitting: Endocrinology

## 2018-08-07 DIAGNOSIS — H903 Sensorineural hearing loss, bilateral: Secondary | ICD-10-CM | POA: Diagnosis not present

## 2018-09-20 DIAGNOSIS — E1151 Type 2 diabetes mellitus with diabetic peripheral angiopathy without gangrene: Secondary | ICD-10-CM | POA: Diagnosis not present

## 2018-09-20 DIAGNOSIS — L84 Corns and callosities: Secondary | ICD-10-CM | POA: Diagnosis not present

## 2018-09-20 DIAGNOSIS — M79676 Pain in unspecified toe(s): Secondary | ICD-10-CM | POA: Diagnosis not present

## 2018-09-20 DIAGNOSIS — B351 Tinea unguium: Secondary | ICD-10-CM | POA: Diagnosis not present

## 2018-10-25 ENCOUNTER — Other Ambulatory Visit: Payer: Self-pay | Admitting: Endocrinology

## 2018-10-25 NOTE — Telephone Encounter (Signed)
Please refill x 3 months Further refills would have to be considered by new PCP   

## 2018-11-15 ENCOUNTER — Other Ambulatory Visit: Payer: Self-pay | Admitting: Endocrinology

## 2018-11-29 ENCOUNTER — Telehealth: Payer: Self-pay | Admitting: Endocrinology

## 2018-11-29 ENCOUNTER — Ambulatory Visit: Payer: PPO | Admitting: Endocrinology

## 2018-11-29 DIAGNOSIS — Z0289 Encounter for other administrative examinations: Secondary | ICD-10-CM

## 2018-11-29 NOTE — Telephone Encounter (Signed)
Patient no showed today's appt. Please advise on how to follow up. °A. No follow up necessary. °B. Follow up urgent. Contact patient immediately. °C. Follow up necessary. Contact patient and schedule visit in ___ days. °D. Follow up advised. Contact patient and schedule visit in ____weeks. ° °Would you like the NS fee to be applied to this visit? ° °

## 2018-11-29 NOTE — Telephone Encounter (Signed)
No f/u needed 

## 2019-02-11 ENCOUNTER — Other Ambulatory Visit: Payer: Self-pay | Admitting: Endocrinology

## 2019-02-11 NOTE — Telephone Encounter (Signed)
Please advise if this refill is appropriate

## 2019-02-11 NOTE — Telephone Encounter (Signed)
Please refill x 1 Ov is due  

## 2019-02-14 ENCOUNTER — Other Ambulatory Visit: Payer: Self-pay | Admitting: Endocrinology

## 2019-02-26 ENCOUNTER — Other Ambulatory Visit: Payer: Self-pay | Admitting: Endocrinology

## 2019-02-27 ENCOUNTER — Telehealth: Payer: Self-pay | Admitting: Endocrinology

## 2019-02-27 NOTE — Telephone Encounter (Signed)
MEDICATION: metformin, allopurinol, furosemide  PHARMACY:  [facility] in [place]  IS THIS A 90 DAY SUPPLY : yes  IS PATIENT OUT OF MEDICATION: completely out  IF NOT; HOW MUCH IS LEFT:   LAST APPOINTMENT DATE: @4 /14/2020  NEXT APPOINTMENT DATE:@Visit  date not found  DO WE HAVE YOUR PERMISSION TO LEAVE A DETAILED MESSAGE:  OTHER COMMENTS:    **Let patient know to contact pharmacy at the end of the day to make sure medication is ready. **  ** Please notify patient to allow 48-72 hours to process**  **Encourage patient to contact the pharmacy for refills or they can request refills through [facility]**

## 2019-02-28 ENCOUNTER — Ambulatory Visit (INDEPENDENT_AMBULATORY_CARE_PROVIDER_SITE_OTHER): Payer: PPO | Admitting: Endocrinology

## 2019-02-28 ENCOUNTER — Telehealth: Payer: Self-pay | Admitting: Internal Medicine

## 2019-02-28 ENCOUNTER — Other Ambulatory Visit: Payer: Self-pay

## 2019-02-28 DIAGNOSIS — E1122 Type 2 diabetes mellitus with diabetic chronic kidney disease: Secondary | ICD-10-CM

## 2019-02-28 MED ORDER — FUROSEMIDE 20 MG PO TABS: 20.0000 mg | ORAL_TABLET | Freq: Every day | ORAL | 0 refills | Status: DC

## 2019-02-28 MED ORDER — ALLOPURINOL 100 MG PO TABS: 100.0000 mg | ORAL_TABLET | Freq: Every day | ORAL | 0 refills | Status: DC

## 2019-02-28 MED ORDER — METFORMIN HCL ER 500 MG PO TB24: 2000.0000 mg | ORAL_TABLET | Freq: Every day | ORAL | 3 refills | Status: DC

## 2019-02-28 NOTE — Patient Instructions (Addendum)
Please get set up with a new primary care provider. Please continue the same medication.  Please come back for a follow-up appointment in 2 months.

## 2019-02-28 NOTE — Progress Notes (Addendum)
Subjective:    Patient ID: Destiny Flowers, female    DOB: 01/26/1929, 85 y.o.   MRN: [id number]  HPI  Pt is here for telehealth visit, via Telephone visit.  Total time=10 minutes.   Alternatives to telehealth are presented to this patient, and the patient agrees to the visit. She is advised of the cost of the visit, and she agrees to this, also.   Patient is at home, and I am at the office.   Pt returns for f/u of diabetes mellitus:  DM type: 2 Dx'ed: 1997 Complications: none Therapy: metformin.  GDM: G0 DKA: never Severe hypoglycemia: never.  Pancreatitis: never.  Other: pt stopped [place], as she said it was causing her to fall.   Interval history: pt states she feels well in general, except for weight gain.  She takes meforfmin as rx'ed.  She says cbg's are well-controlled.  Past Medical History:  Diagnosis Date  . ALLERGIC RHINITIS   . DIABETES MELLITUS, TYPE II 06/06/2007  . DVT (deep venous thrombosis) (HCC)    Left leg  . Dysthymic disorder   . GERD 06/06/2007  . Homocysteinemia (HCC)   . HYPERCHOLESTEROLEMIA 02/19/2008  . HYPERTENSION 06/06/2007  . HYPOTHYROIDISM 06/06/2007  . INSOMNIA   . OBSTRUCTIVE SLEEP APNEA   . OSTEOARTHRITIS   . OSTEOPOROSIS 06/06/2007  . RENAL INSUFFICIENCY 06/06/2007    No past surgical history on file.  Social History   Socioeconomic History  . Marital status: Single    Spouse name: Not on file  . Number of children: Not on file  . Years of education: Not on file  . Highest education level: Not on file  Occupational History  . Not on file  Social Needs  . Financial resource strain: Not on file  . Food insecurity:    Worry: Not on file    Inability: Not on file  . Transportation needs:    Medical: Not on file    Non-medical: Not on file  Tobacco Use  . Smoking status: Passive Smoke Exposure - Never Smoker  . Smokeless tobacco: Never Used  Substance and Sexual Activity  . Alcohol use: No  . Drug use: No  . Sexual activity:  Not on file  Lifestyle  . Physical activity:    Days per week: Not on file    Minutes per session: Not on file  . Stress: Not on file  Relationships  . Social connections:    Talks on phone: Not on file    Gets together: Not on file    Attends religious service: Not on file    Active member of club or organization: Not on file    Attends meetings of clubs or organizations: Not on file    Relationship status: Not on file  . Intimate partner violence:    Fear of current or ex partner: Not on file    Emotionally abused: Not on file    Physically abused: Not on file    Forced sexual activity: Not on file  Other Topics Concern  . Not on file  Social History Narrative  . Not on file    Current Outpatient Medications on File Prior to Visit  Medication Sig Dispense Refill  . aspirin 81 MG tablet Take 81 mg by mouth daily.      . folic acid (FOLVITE) 800 MCG tablet Take 800 mcg by mouth daily.    Marland Kitchen. glucose blood (ONETOUCH VERIO) test strip 1 each by Other route daily. And  lancets 1/day 100 each 12  . levothyroxine (SYNTHROID, LEVOTHROID) 100 MCG tablet TAKE 1 TABLET BY MOUTH ONCE DAILY BEFORE BREAKFAST 90 tablet 0  . losartan (COZAAR) 100 MG tablet TAKE 1 TABLET BY MOUTH ONCE DAILY 90 tablet 3  . Multiple Vitamin (MULTIVITAMIN) tablet Take 1 tablet by mouth daily.      Marland Kitchen omeprazole (PRILOSEC OTC) 20 MG tablet Take 20 mg by mouth daily.    . traZODone (DESYREL) 100 MG tablet Take 1 tablet (100 mg total) by mouth daily with breakfast. 30 tablet 11   No current facility-administered medications on file prior to visit.     Allergies  Allergen Reactions  . Januvia [Sitagliptin]     falls  . Penicillins Other (See Comments)    Unk    Family History  Problem Relation Age of Onset  . Stroke Mother   . Coronary artery disease Father      Review of Systems Denies N/V    Objective:   Physical Exam      Assessment & Plan:  Type 2 DM, apparently well-controlled.    Patient  Instructions  Please get set up with a new primary care provider. Please continue the same medication.  Please come back for a follow-up appointment in 2 months.

## 2019-02-28 NOTE — Telephone Encounter (Signed)
Copied from CRM [id number]. Topic: Appointment Scheduling - New Patient >> Feb 28, 2019 10:05 AM Floria Raveling A wrote: Pt called in and wanted to know if Dr Macario Golds would take her on as a new pt.  She stated that Dot yates referred her.  She requested that I send message even though Dr Macario Golds is not taking new pt   Bewst number [phone]

## 2019-02-28 NOTE — Telephone Encounter (Signed)
Addressed during telehealth visit today

## 2019-03-04 NOTE — Telephone Encounter (Signed)
Unfortunately, I'm not able to accept any more new patients at this time.  I'm sorry! Thank you!  

## 2019-03-05 NOTE — Telephone Encounter (Signed)
Pt informed and I told her of other office locations

## 2019-03-08 ENCOUNTER — Encounter: Payer: Self-pay | Admitting: Family Medicine

## 2019-03-08 ENCOUNTER — Ambulatory Visit (INDEPENDENT_AMBULATORY_CARE_PROVIDER_SITE_OTHER): Payer: PPO | Admitting: Family Medicine

## 2019-03-08 ENCOUNTER — Other Ambulatory Visit: Payer: Self-pay

## 2019-03-08 DIAGNOSIS — E039 Hypothyroidism, unspecified: Secondary | ICD-10-CM

## 2019-03-08 DIAGNOSIS — E1122 Type 2 diabetes mellitus with diabetic chronic kidney disease: Secondary | ICD-10-CM

## 2019-03-08 DIAGNOSIS — M150 Primary generalized (osteo)arthritis: Secondary | ICD-10-CM

## 2019-03-08 DIAGNOSIS — I10 Essential (primary) hypertension: Secondary | ICD-10-CM

## 2019-03-08 DIAGNOSIS — M1A9XX0 Chronic gout, unspecified, without tophus (tophi): Secondary | ICD-10-CM

## 2019-03-08 DIAGNOSIS — M159 Polyosteoarthritis, unspecified: Secondary | ICD-10-CM

## 2019-03-08 DIAGNOSIS — E7800 Pure hypercholesterolemia, unspecified: Secondary | ICD-10-CM | POA: Diagnosis not present

## 2019-03-08 DIAGNOSIS — G4700 Insomnia, unspecified: Secondary | ICD-10-CM | POA: Diagnosis not present

## 2019-03-08 NOTE — Progress Notes (Signed)
Subjective:    Patient ID: Destiny EarthlyMarilyn A Cales, female    DOB: 07/09/1929, 85 y.o.   MRN: [id number]  HPI Virtual Visit via Video Note  I connected with the patient on 03/08/19 at 10:00 AM EDT by a video enabled telemedicine application and verified that I am speaking with the correct person using two identifiers.  Location patient: home Location provider:work or home office Persons participating in the virtual visit: patient, provider  I discussed the limitations of evaluation and management by telemedicine and the availability of in person appointments. The patient expressed understanding and agreed to proceed.   HPI: Here for an introductory visit with me to provide primary care. She has been seeing Dr. Romero BellingSean Ellison for years for this, but he will be focusing only on the diabetes from this point forward. In general she is doing well. She does complain of intermittent pain in the feet and knees and back, but she gets around by using a walker. She does not check her glucoses at home, but her last A1c in July was 6.7. Her BP has been well controlled. She has been on Lasix for some time for swelling in the feet, but she says it dries her skin out and makes her itch all over. She has also read that this can contribute to diabetes and gout, so she asks if she can stop taking it. She denies any chest pain or SOB. Her renal function has been stable. She gets yearly mammograms. She has a hx of osteoporosis, but this had improved to osteopenia per her last DEXA in 2017. Her appetite is good and she sleeps well.    ROS: See pertinent positives and negatives per HPI.  Past Medical History:  Diagnosis Date  . ALLERGIC RHINITIS   . DIABETES MELLITUS, TYPE II 06/06/2007  . DVT (deep venous thrombosis) (HCC)    Left leg  . Dysthymic disorder   . GERD 06/06/2007  . Homocysteinemia (HCC)   . HYPERCHOLESTEROLEMIA 02/19/2008  . HYPERTENSION 06/06/2007  . HYPOTHYROIDISM 06/06/2007  . INSOMNIA   . OBSTRUCTIVE  SLEEP APNEA   . OSTEOARTHRITIS   . OSTEOPOROSIS 06/06/2007  . RENAL INSUFFICIENCY 06/06/2007    History reviewed. No pertinent surgical history.  Family History  Problem Relation Age of Onset  . Stroke Mother   . Coronary artery disease Father      Current Outpatient Medications:  .  allopurinol (ZYLOPRIM) 100 MG tablet, Take 1 tablet (100 mg total) by mouth daily., Disp: 90 tablet, Rfl: 0 .  aspirin 81 MG tablet, Take 81 mg by mouth daily.  , Disp: , Rfl:  .  folic acid (FOLVITE) 800 MCG tablet, Take 800 mcg by mouth daily., Disp: , Rfl:  .  furosemide (LASIX) 20 MG tablet, Take 1 tablet (20 mg total) by mouth daily., Disp: 90 tablet, Rfl: 0 .  glucose blood (ONETOUCH VERIO) test strip, 1 each by Other route daily. And lancets 1/day, Disp: 100 each, Rfl: 12 .  levothyroxine (SYNTHROID, LEVOTHROID) 100 MCG tablet, TAKE 1 TABLET BY MOUTH ONCE DAILY BEFORE BREAKFAST, Disp: 90 tablet, Rfl: 0 .  losartan (COZAAR) 100 MG tablet, TAKE 1 TABLET BY MOUTH ONCE DAILY, Disp: 90 tablet, Rfl: 3 .  metFORMIN (GLUCOPHAGE-XR) 500 MG 24 hr tablet, Take 4 tablets (2,000 mg total) by mouth daily., Disp: 360 tablet, Rfl: 3 .  Multiple Vitamin (MULTIVITAMIN) tablet, Take 1 tablet by mouth daily.  , Disp: , Rfl:  .  omeprazole (PRILOSEC OTC) 20 MG  tablet, Take 20 mg by mouth daily., Disp: , Rfl:  .  traZODone (DESYREL) 100 MG tablet, Take 1 tablet (100 mg total) by mouth daily with breakfast., Disp: 30 tablet, Rfl: 11  EXAM:  VITALS per patient if applicable:  GENERAL: alert, oriented, appears well and in no acute distress  HEENT: atraumatic, conjunttiva clear, no obvious abnormalities on inspection of external nose and ears  NECK: normal movements of the head and neck  LUNGS: on inspection no signs of respiratory distress, breathing rate appears normal, no obvious gross SOB, gasping or wheezing  CV: no obvious cyanosis  MS: moves all visible extremities without noticeable abnormality   PSYCH/NEURO: pleasant and cooperative, no obvious depression or anxiety, speech and thought processing grossly intact  ASSESSMENT AND PLAN: Intro visit with this lovely patient. I told her it would be okay to stop the Lasix for awhile and see how she does. She knows to watch for increased leg swelling or weight gain. We will meet again in 2 weeks to follow this up. Her diabetes, hypothyroidism, osteoarthritis, and HTN are well controlled.  Gershon Crane, MD  Discussed the following assessment and plan:  No diagnosis found.     I discussed the assessment and treatment plan with the patient. The patient was provided an opportunity to ask questions and all were answered. The patient agreed with the plan and demonstrated an understanding of the instructions.   The patient was advised to call back or seek an in-person evaluation if the symptoms worsen or if the condition fails to improve as anticipated.     Review of Systems     Objective:   Physical Exam        Assessment & Plan:

## 2019-05-09 ENCOUNTER — Other Ambulatory Visit: Payer: Self-pay

## 2019-05-13 ENCOUNTER — Ambulatory Visit: Payer: PPO | Admitting: Endocrinology

## 2019-05-30 ENCOUNTER — Telehealth: Payer: Self-pay | Admitting: Family Medicine

## 2019-05-30 NOTE — Telephone Encounter (Signed)
Medication Refill - Medication: metFORMIN (GLUCOPHAGE-XR) 500 MG 24 hr tablet/levothyroxine (SYNTHROID, LEVOTHROID) 100 MCG tablet/losartan (COZAAR) 100 MG tablet  Has the patient contacted their pharmacy? Yes.   (Agent: If no, request that the patient contact the pharmacy for the refill.) (Agent: If yes, when and what did the pharmacy advise?)  Preferred Pharmacy (with phone number or street name):  [facility], [address] [phone] (Phone) [phone] (Fax)     Agent: Please be advised that RX refills may take up to 3 business days. We ask that you follow-up with your pharmacy.

## 2019-06-04 MED ORDER — METFORMIN HCL ER 500 MG PO TB24: 2000.0000 mg | ORAL_TABLET | Freq: Every day | ORAL | 3 refills | Status: DC

## 2019-06-04 MED ORDER — LEVOTHYROXINE SODIUM 100 MCG PO TABS: ORAL_TABLET | ORAL | 0 refills | Status: DC

## 2019-06-04 NOTE — Telephone Encounter (Signed)
Prescription(s) have been sent

## 2019-06-06 ENCOUNTER — Other Ambulatory Visit: Payer: Self-pay | Admitting: Endocrinology

## 2019-06-06 ENCOUNTER — Telehealth: Payer: Self-pay | Admitting: Family Medicine

## 2019-06-06 NOTE — Telephone Encounter (Signed)
Patient is calling in checking on status of med refill.

## 2019-06-06 NOTE — Telephone Encounter (Signed)
Copied from [place] [phone]. Topic: Quick Communication - Rx Refill/Question >> Jun 06, 2019 11:53 AM Nathanial Millman J wrote: Medication: levothyroxine (SYNTHROID) 100 MCG tablet - this med has a Best boy and pharm needs an ok to fill  Losartan 100 mg - 90 day supply - pt is asking fro Dr Sarajane Jews to fill this med Pt is out of both meds   Has the patient contacted their pharmacy? Yes.   (Agent: If no, request that the patient contact the pharmacy for the refill.) (Agent: If yes, when and what did the pharmacy advise?)  Preferred Pharmacy (with phone number or street name): [facility] [place]    Agent: Please be advised that RX refills may take up to 3 business days. We ask that you follow-up with your pharmacy.

## 2019-06-06 NOTE — Telephone Encounter (Signed)
Please forward refill request to pt's new primary care provider.  

## 2019-06-06 NOTE — Telephone Encounter (Signed)
Please advise if refill is appropriate 

## 2019-06-07 NOTE — Telephone Encounter (Signed)
Rx was sent on on 06/04/2019. Atc patient. Unable to leave a voicemail.  CRM created.

## 2019-06-07 NOTE — Telephone Encounter (Signed)
At Dr. Ellison's request, I am forwarding this patient's refill request. Please review and refill if you deem appropriate. 

## 2019-06-12 NOTE — Telephone Encounter (Signed)
ATC 

## 2019-06-21 NOTE — Telephone Encounter (Signed)
ATC 

## 2019-06-26 NOTE — Telephone Encounter (Signed)
Unable to reach the patient. Message will be closed.  

## 2019-08-08 ENCOUNTER — Encounter: Payer: Self-pay | Admitting: Family Medicine

## 2019-08-08 DIAGNOSIS — H04123 Dry eye syndrome of bilateral lacrimal glands: Secondary | ICD-10-CM | POA: Diagnosis not present

## 2019-08-08 DIAGNOSIS — E119 Type 2 diabetes mellitus without complications: Secondary | ICD-10-CM | POA: Diagnosis not present

## 2019-08-08 DIAGNOSIS — Z961 Presence of intraocular lens: Secondary | ICD-10-CM | POA: Diagnosis not present

## 2019-08-08 LAB — HM DIABETES EYE EXAM

## 2019-08-15 ENCOUNTER — Other Ambulatory Visit: Payer: Self-pay

## 2019-08-15 MED ORDER — ALLOPURINOL 100 MG PO TABS: 100.0000 mg | ORAL_TABLET | Freq: Every day | ORAL | 0 refills | Status: DC

## 2019-09-04 ENCOUNTER — Other Ambulatory Visit: Payer: Self-pay | Admitting: Endocrinology

## 2019-09-04 ENCOUNTER — Other Ambulatory Visit: Payer: Self-pay | Admitting: Family Medicine

## 2019-09-05 NOTE — Telephone Encounter (Signed)
Please advise 

## 2019-09-05 NOTE — Telephone Encounter (Signed)
Per Dr. Ellison's request, I am forwarding this refill request. Please review and refill if appropriate.  

## 2019-09-05 NOTE — Telephone Encounter (Signed)
Please forward refill request to pt's primary care provider.   

## 2019-12-30 ENCOUNTER — Telehealth: Payer: Self-pay | Admitting: Family Medicine

## 2019-12-30 NOTE — Progress Notes (Signed)
  Chronic Care Management   Outreach Note  12/30/2019 Name: Cordella Nyquist MRN: [id number] DOB: May 20, 1929  Referred by: Nelwyn Salisbury, MD Reason for referral : No chief complaint on file.   An unsuccessful telephone outreach was attempted today. The patient was referred to the pharmacist for assistance with care management and care coordination.   Follow Up Plan:   [name] [employer] Scheduler

## 2020-01-15 ENCOUNTER — Telehealth: Payer: Self-pay | Admitting: Family Medicine

## 2020-01-15 NOTE — Progress Notes (Signed)
  Chronic Care Management   Outreach Note  01/15/2020 Name: Destiny Flowers MRN: [id number] DOB: 1929/05/28  Referred by: Nelwyn Salisbury, MD Reason for referral : No chief complaint on file.   A second unsuccessful telephone outreach was attempted today. The patient was referred to pharmacist for assistance with care management and care coordination.  Follow Up Plan:   [name] [employer] Scheduler

## 2020-01-16 ENCOUNTER — Telehealth: Payer: Self-pay | Admitting: Family Medicine

## 2020-01-16 NOTE — Chronic Care Management (AMB) (Signed)
  Chronic Care Management   Note  01/16/2020 Name: Destiny Flowers MRN: [id number] DOB: 04/24/1929  Destiny Flowers is a 86 y.o. year old female who is a primary care patient of Nelwyn Salisbury, MD. I reached out to Biagio Quint by phone today in response to a referral sent by Destiny Flowers's PCP, Nelwyn Salisbury, MD.   Destiny Flowers was given information about Chronic Care Management services today including:  1. CCM service includes personalized support from designated clinical staff supervised by her physician, including individualized plan of care and coordination with other care providers 2. 24/7 contact phone numbers for assistance for urgent and routine care needs. 3. Service will only be billed when office clinical staff spend 20 minutes or more in a month to coordinate care. 4. Only one practitioner may furnish and bill the service in a calendar month. 5. The patient may stop CCM services at any time (effective at the end of the month) by phone call to the office staff.   Patient agreed to services and verbal consent obtained.   Follow up plan:   [name] [employer] Scheduler

## 2020-01-19 ENCOUNTER — Other Ambulatory Visit: Payer: Self-pay | Admitting: Family Medicine

## 2020-01-24 ENCOUNTER — Other Ambulatory Visit: Payer: Self-pay | Admitting: *Deleted

## 2020-01-24 MED ORDER — ALLOPURINOL 100 MG PO TABS: 100.0000 mg | ORAL_TABLET | Freq: Every day | ORAL | 0 refills | Status: DC

## 2020-02-03 ENCOUNTER — Telehealth: Payer: Self-pay

## 2020-02-03 DIAGNOSIS — I10 Essential (primary) hypertension: Secondary | ICD-10-CM

## 2020-02-03 DIAGNOSIS — E7800 Pure hypercholesterolemia, unspecified: Secondary | ICD-10-CM

## 2020-02-03 NOTE — Telephone Encounter (Signed)
I am requesting an ambulatory referral to CCM be placed for this patient. Referral will need to have 2 current diagnosis attached to it. Thank you!  

## 2020-02-04 ENCOUNTER — Other Ambulatory Visit: Payer: Self-pay

## 2020-02-04 ENCOUNTER — Telehealth: Payer: Self-pay

## 2020-02-04 ENCOUNTER — Ambulatory Visit: Payer: PPO

## 2020-02-04 DIAGNOSIS — E7800 Pure hypercholesterolemia, unspecified: Secondary | ICD-10-CM

## 2020-02-04 DIAGNOSIS — E1122 Type 2 diabetes mellitus with diabetic chronic kidney disease: Secondary | ICD-10-CM

## 2020-02-04 DIAGNOSIS — M1A9XX0 Chronic gout, unspecified, without tophus (tophi): Secondary | ICD-10-CM

## 2020-02-04 DIAGNOSIS — E039 Hypothyroidism, unspecified: Secondary | ICD-10-CM

## 2020-02-04 DIAGNOSIS — I10 Essential (primary) hypertension: Secondary | ICD-10-CM

## 2020-02-04 NOTE — Telephone Encounter (Signed)
I completed CCM visit and patient was inquiring to have an appointment scheduled with Dr. Clent Ridges since she is due.  Can we get this set up? Last visit with Dr. Clent Ridges 02/2019. Thanks!

## 2020-02-04 NOTE — Patient Instructions (Addendum)
Visit Information  Goals Addressed            This Visit's Progress   . Pharmacy Care Plan       CARE PLAN ENTRY  Current Barriers:  . Chronic Disease Management support, education, and care coordination needs related to HTN, HLD, DMII, and Arteriosclerosis of right carotid artery, hypothyroidism, GERD, gout   Pharmacist Clinical Goal(s):  Marland Kitchen Work with the care management team to address educational, disease management, and care coordination needs  . Call provider office for new or worsened signs and symptoms  . Call care management team with questions or concerns.  . Diabetes:  Marland Kitchen Maintain A1c < 7.0% . Current A1c: 6.7% (05/29/2018)  . Maintain up to date on diabetes preventative health (eye exams every 1 to 2 years and foot exams at least once yearly).  . Blood pressure . Maintain blood pressure within goal of your provider (130/80 or 140/90)  . High cholesterol:  . Cholesterol goals: Total Cholesterol goal under 200, Triglycerides goal under 150, HDL goal above 40 (men) or above 50 (women), LDL goal under 100.  . Current cholesterol levels (11/29/2017): total cholesterol: 151; triglycerides: 123; HDL: 50; LDL: 76.  . Hypothyroidism:  . Maintain TSH between 0.35 to 4.5uIU/ml. . Current TSH (11/29/2017): 1.10 Iu/ml . Gout . Continue decrease gout flare-ups. . Acid reflux/ GERD . Minimize reflux symptoms.   Interventions: . Comprehensive medication review performed. . Evaluation of current treatment plans and patient's adherence to plan as established by provider . Assessed patient's education and care coordination needs . Provided disease specific education to patient . Discussed with Dr. Sarajane Jews pending lab work. You will need to make physical exam appointment to obtain appropriate labs.  . Diabetes . Discussed importance of diabetes preventative health exams. . Obtain diabetic eye exam every 1 to 2 years.  . Obtain at least once yearly foot exams. . Continue: metformin ER  500mg , 4 tablets once daily.  . Blood pressure . Discussed need to continue checking blood pressure at home.  . Discussed diet modifications. DASH diet:  following a diet emphasizing fruits and vegetables and low-fat dairy products along with whole grains, fish, poultry, and nuts. Reducing red meats and sugars.  . Exercising   . Reducing the amount of salt intake to 1500mg /per day.  . Recommend using a salt substitute to replace your salt if you need flavor.    . Weight reduction- We discussed losing 5-10% of body weight . Continue: losartan 100mg , 1 tablet once daily and furosemide 20mg , 1 tablet once daily  . High cholesterol . How to reduce cholesterol through diet/weight management and physical activity.    . We discussed how a diet high in plant sterols (fruits/vegetables/nuts/whole grains/legumes) may reduce your cholesterol.  Encouraged increasing fiber to a daily intake of 10-25g/day  . Continue: with diet modifications.  . Hypothyroidism . Continue:  levothyroxine112mcg, 1 tablet once daily . Acid reflux/ GERD . Discussed non-pharmacological interventions for acid reflux. Take measures to prevent acid reflux, such as avoiding spicy foods, avoiding caffeine, avoid laying down a few hours after eating, and raising the head of the bed. . Discussed risks with long term use of PPIs (omeprazole).  . Gout . Continue: allopurinol 100mg , 1 tablet once daily   Patient Self Care Activities:  . Self administers medications as prescribed and Calls provider office for new concerns or questions . Continue current medications as directed by providers.  . Continue working on health habits (diet/ exercise).  Please see past updates related to this goal by clicking on the "Past Updates" button in the selected goal         Ms. Hyslop was given information about Chronic Care Management services today including:  1. CCM service includes personalized support from designated clinical staff  supervised by her physician, including individualized plan of care and coordination with other care providers 2. 24/7 contact phone numbers for assistance for urgent and routine care needs. 3. Standard insurance, coinsurance, copays and deductibles apply for chronic care management only during months in which we provide at least 20 minutes of these services. Most insurances cover these services at 100%, however patients may be responsible for any copay, coinsurance and/or deductible if applicable. This service may help you avoid the need for more expensive face-to-face services. 4. Only one practitioner may furnish and bill the service in a calendar month. 5. The patient may stop CCM services at any time (effective at the end of the month) by phone call to the office staff.  Patient agreed to services and verbal consent obtained.   The patient verbalized understanding of instructions provided today and agreed to receive a mailed copy of patient instruction and/or educational materials. Telephone follow up appointment with pharmacy team member scheduled for: 08/06/2020  Laural Benes, PharmD Clinical Pharmacist [facility] [phone]   Diabetes Mellitus and Standards of Medical Care Managing diabetes (diabetes mellitus) can be complicated. Your diabetes treatment may be managed by a team of health care providers, including:  A physician who specializes in diabetes (endocrinologist).  A nurse practitioner or physician assistant.  Nurses.  A diet and nutrition specialist (registered dietitian).  A certified diabetes educator (CDE).  An exercise specialist.  A pharmacist.  An eye doctor.  A foot specialist (podiatrist).  A dentist.  A primary care provider.  A mental health provider. Your health care providers follow guidelines to help you get the best quality of care. The following schedule is a general guideline for your diabetes management  plan. Your health care providers may give you more specific instructions. Physical exams Upon being diagnosed with diabetes mellitus, and each year after that, your health care provider will ask about your medical and family history. He or she will also do a physical exam. Your exam may include:  Measuring your height, weight, and body mass index (BMI).  Checking your blood pressure. This will be done at every routine medical visit. Your target blood pressure may vary depending on your medical conditions, your age, and other factors.  Thyroid gland exam.  Skin exam.  Screening for damage to your nerves (peripheral neuropathy). This may include checking the pulse in your legs and feet and checking the level of sensation in your hands and feet.  A complete foot exam to inspect the structure and skin of your feet, including checking for cuts, bruises, redness, blisters, sores, or other problems.  Screening for blood vessel (vascular) problems, which may include checking the pulse in your legs and feet and checking your temperature. Blood tests Depending on your treatment plan and your personal needs, you may have the following tests done:  HbA1c (hemoglobin A1c). This test provides information about blood sugar (glucose) control over the previous 2-3 months. It is used to adjust your treatment plan, if needed. This test will be done: ? At least 2 times a year, if you are meeting your treatment goals. ? 4 times a year, if you are not meeting your  treatment goals or if treatment goals have changed.  Lipid testing, including total, LDL, and HDL cholesterol and triglyceride levels. ? The goal for LDL is less than 100 mg/dL (5.5 mmol/L). If you are at high risk for complications, the goal is less than 70 mg/dL (3.9 mmol/L). ? The goal for HDL is 40 mg/dL (2.2 mmol/L) or higher for men and 50 mg/dL (2.8 mmol/L) or higher for women. An HDL cholesterol of 60 mg/dL (3.3 mmol/L) or higher gives some  protection against heart disease. ? The goal for triglycerides is less than 150 mg/dL (8.3 mmol/L).  Liver function tests.  Kidney function tests.  Thyroid function tests. Dental and eye exams  Visit your dentist two times a year.  If you have type 1 diabetes, your health care provider may recommend an eye exam 3-5 years after you are diagnosed, and then once a year after your first exam. ? For children with type 1 diabetes, a health care provider may recommend an eye exam when your child is age 79 or older and has had diabetes for 3-5 years. After the first exam, your child should get an eye exam once a year.  If you have type 2 diabetes, your health care provider may recommend an eye exam as soon as you are diagnosed, and then once a year after your first exam. Immunizations   The yearly flu (influenza) vaccine is recommended for everyone 6 months or older who has diabetes.  The pneumonia (pneumococcal) vaccine is recommended for everyone 2 years or older who has diabetes. If you are 2 or older, you may get the pneumonia vaccine as a series of two separate shots.  The hepatitis B vaccine is recommended for adults shortly after being diagnosed with diabetes.  Adults and children with diabetes should receive all other vaccines according to age-specific recommendations from the Centers for Disease Control and Prevention (CDC). Mental and emotional health Screening for symptoms of eating disorders, anxiety, and depression is recommended at the time of diagnosis and afterward as needed. If your screening shows that you have symptoms (positive screening result), you may need more evaluation and you may work with a mental health care provider. Treatment plan Your treatment plan will be reviewed at every medical visit. You and your health care provider will discuss:  How you are taking your medicines, including insulin.  Any side effects you are experiencing.  Your blood glucose target  goals.  The frequency of your blood glucose monitoring.  Lifestyle habits, such as activity level as well as tobacco, alcohol, and substance use. Diabetes self-management education Your health care provider will assess how well you are monitoring your blood glucose levels and whether you are taking your insulin correctly. He or she may refer you to:  A certified diabetes educator to manage your diabetes throughout your life, starting at diagnosis.  A registered dietitian who can create or review your personal nutrition plan.  An exercise specialist who can discuss your activity level and exercise plan. Summary  Managing diabetes (diabetes mellitus) can be complicated. Your diabetes treatment may be managed by a team of health care providers.  Your health care providers follow guidelines in order to help you get the best quality of care.  Standards of care including having regular physical exams, blood tests, blood pressure monitoring, immunizations, screening tests, and education about how to manage your diabetes.  Your health care providers may also give you more specific instructions based on your individual health. This information is  not intended to replace advice given to you by your health care provider. Make sure you discuss any questions you have with your health care provider. Document Revised: 07/20/2018 Document Reviewed: 07/29/2016 Elsevier Patient Education  2020 [employer].  Food Choices for Gastroesophageal Reflux Disease, Adult When you have gastroesophageal reflux disease (GERD), the foods you eat and your eating habits are very important. Choosing the right foods can help ease your discomfort. Think about working with a nutrition specialist (dietitian) to help you make good choices. What are tips for following this plan?  Meals  Choose healthy foods that are low in fat, such as fruits, vegetables, whole grains, low-fat dairy products, and lean meat, fish, and  poultry.  Eat small meals often instead of 3 large meals a day. Eat your meals slowly, and in a place where you are relaxed. Avoid bending over or lying down until 2-3 hours after eating.  Avoid eating meals 2-3 hours before bed.  Avoid drinking a lot of liquid with meals.  Cook foods using methods other than frying. Bake, grill, or broil food instead.  Avoid or limit: ? Chocolate. ? Peppermint or spearmint. ? Alcohol. ? Pepper. ? Black and decaffeinated coffee. ? Black and decaffeinated tea. ? Bubbly (carbonated) soft drinks. ? Caffeinated energy drinks and soft drinks.  Limit high-fat foods such as: ? Fatty meat or fried foods. ? Whole milk, cream, butter, or ice cream. ? Nuts and nut butters. ? Pastries, donuts, and sweets made with butter or shortening.  Avoid foods that cause symptoms. These foods may be different for everyone. Common foods that cause symptoms include: ? Tomatoes. ? Oranges, lemons, and limes. ? Peppers. ? Spicy food. ? Onions and garlic. ? Vinegar. Lifestyle  Maintain a healthy weight. Ask your doctor what weight is healthy for you. If you need to lose weight, work with your doctor to do so safely.  Exercise for at least 30 minutes for 5 or more days each week, or as told by your doctor.  Wear loose-fitting clothes.  Do not smoke. If you need help quitting, ask your doctor.  Sleep with the head of your bed higher than your feet. Use a wedge under the mattress or blocks under the bed frame to raise the head of the bed. Summary  When you have gastroesophageal reflux disease (GERD), food and lifestyle choices are very important in easing your symptoms.  Eat small meals often instead of 3 large meals a day. Eat your meals slowly, and in a place where you are relaxed.  Limit high-fat foods such as fatty meat or fried foods.  Avoid bending over or lying down until 2-3 hours after eating.  Avoid peppermint and spearmint, caffeine, alcohol, and  chocolate. This information is not intended to replace advice given to you by your health care provider. Make sure you discuss any questions you have with your health care provider. Document Revised: 02/21/2019 Document Reviewed: 12/06/2016 Elsevier Patient Education  2020 [employer].

## 2020-02-04 NOTE — Chronic Care Management (AMB) (Signed)
Chronic Care Management Pharmacy  Name: Destiny Flowers  MRN: [id number] DOB: [birth year]  Initial Questions: 1. Have you seen any other providers since your last visit? NA 2. Any changes in your medicines or health? No   Chief Complaint/ HPI  Destiny Flowers,  86 y.o. , female presents for their Initial CCM visit with the clinical pharmacist via telephone due to COVID-19 Pandemic.  PCP : Nelwyn Salisbury, MD  Their chronic conditions include: DM, HTN, HLD/arteriosclerosis of right carotid artery, Hypothyroidism, GERD, Gout  Office Visits: 03/08/2019- Patient presented via virtual visit to Dr. Gershon Crane, MD to establish care. Patient inquired to hold Lasix as it dries out her skin and makes her itch all over. Patient instructed okay to stop Lasix and monitor for leg swelling or weight gain. HTN, DM, hypothyroidism, and osteoarthritis are well controlled.   Consult Visit: 02/28/2019- Endocrinology- Patient presented to Dr. Romero Belling, MD for DM follow up. Patient currently on metformin. She stopped Januvia due to falls. Per patient reported BGs are well-controlled. Patient to obtain a new primary care provider and follow up in 2 months.   Medications: Outpatient Encounter Medications as of 02/04/2020  Medication Sig  . allopurinol (ZYLOPRIM) 100 MG tablet Take 1 tablet (100 mg total) by mouth daily.  Marland Kitchen aspirin 81 MG tablet Take 81 mg by mouth daily.    . EUTHYROX 100 MCG tablet TAKE 1 TABLET BY MOUTH ONCE DAILY BEFORE BREAKFAST  . folic acid (FOLVITE) 800 MCG tablet Take 800 mcg by mouth daily.  . furosemide (LASIX) 20 MG tablet Take 1 tablet by mouth once daily  . Iron, Ferrous Sulfate, 325 (65 Fe) MG TABS Take 1 tablet by mouth daily.  Marland Kitchen losartan (COZAAR) 100 MG tablet Take 1 tablet by mouth once daily  . magnesium oxide (MAG-OX) 400 MG tablet Take 400 mg by mouth daily.  . metFORMIN (GLUCOPHAGE-XR) 500 MG 24 hr tablet Take 4 tablets (2,000 mg total) by mouth daily.  . Multiple  Vitamin (MULTIVITAMIN) tablet Take 1 tablet by mouth daily.    Marland Kitchen omeprazole (PRILOSEC OTC) 20 MG tablet Take 20 mg by mouth daily.  . TURMERIC PO Take 500 mg by mouth daily.  . vitamin B-12 (CYANOCOBALAMIN) 500 MCG tablet Take 500 mcg by mouth daily.  . Vitamin D, Cholecalciferol, 50 MCG (2000 UT) CAPS Take 1 capsule by mouth daily.  Marland Kitchen glucose blood (ONETOUCH VERIO) test strip 1 each by Other route daily. And lancets 1/day (Patient not taking: Reported on 02/04/2020)  . traZODone (DESYREL) 100 MG tablet Take 1 tablet (100 mg total) by mouth daily with breakfast. (Patient not taking: Reported on 02/04/2020)   No facility-administered encounter medications on file as of 02/04/2020.    Current Diagnosis/Assessment:  Goals Addressed            This Visit's Progress   . Pharmacy Care Plan       CARE PLAN ENTRY  Current Barriers:  . Chronic Disease Management support, education, and care coordination needs related to HTN, HLD, DMII, and Arteriosclerosis of right carotid artery, hypothyroidism, GERD, gout   Pharmacist Clinical Goal(s):  Marland Kitchen Work with the care management team to address educational, disease management, and care coordination needs  . Call provider office for new or worsened signs and symptoms  . Call care management team with questions or concerns.  . Diabetes:  Marland Kitchen Maintain A1c < 7.0% . Current A1c: 6.7% (05/29/2018)  . Maintain up to date on diabetes preventative health (  eye exams every 1 to 2 years and foot exams at least once yearly).  . Blood pressure . Maintain blood pressure within goal of your provider (130/80 or 140/90)  . High cholesterol:  . Cholesterol goals: Total Cholesterol goal under 200, Triglycerides goal under 150, HDL goal above 40 (men) or above 50 (women), LDL goal under 100.  . Current cholesterol levels (11/29/2017): total cholesterol: 151; triglycerides: 123; HDL: 50; LDL: 76.  . Hypothyroidism:  . Maintain TSH between 0.35 to 4.5uIU/ml. . Current TSH  (11/29/2017): 1.10 Iu/ml . Gout . Continue decrease gout flare-ups. . Acid reflux/ GERD . Minimize reflux symptoms.   Interventions: . Comprehensive medication review performed. . Evaluation of current treatment plans and patient's adherence to plan as established by provider . Assessed patient's education and care coordination needs . Provided disease specific education to patient . Discussed with Dr. Clent Ridges pending lab work. You will need to make physical exam appointment to obtain appropriate labs.  . Diabetes . Discussed importance of diabetes preventative health exams. . Obtain diabetic eye exam every 1 to 2 years.  . Obtain at least once yearly foot exams. . Continue: metformin ER 500mg , 4 tablets once daily.  . Blood pressure . Discussed need to continue checking blood pressure at home.  . Discussed diet modifications. DASH diet:  following a diet emphasizing fruits and vegetables and low-fat dairy products along with whole grains, fish, poultry, and nuts. Reducing red meats and sugars.  . Exercising   . Reducing the amount of salt intake to 1500mg /per day.  . Recommend using a salt substitute to replace your salt if you need flavor.    . Weight reduction- We discussed losing 5-10% of body weight . Continue: losartan 100mg , 1 tablet once daily and furosemide 20mg , 1 tablet once daily  . High cholesterol . How to reduce cholesterol through diet/weight management and physical activity.    . We discussed how a diet high in plant sterols (fruits/vegetables/nuts/whole grains/legumes) may reduce your cholesterol.  Encouraged increasing fiber to a daily intake of 10-25g/day  . Continue: with diet modifications.  . Hypothyroidism . Continue:  levothyroxine151mcg, 1 tablet once daily . Acid reflux/ GERD . Discussed non-pharmacological interventions for acid reflux. Take measures to prevent acid reflux, such as avoiding spicy foods, avoiding caffeine, avoid laying down a few hours after  eating, and raising the head of the bed. . Discussed risks with long term use of PPIs (omeprazole).  . Gout . Continue: allopurinol 100mg , 1 tablet once daily   Patient Self Care Activities:  . Self administers medications as prescribed and Calls provider office for new concerns or questions . Continue current medications as directed by providers.  . Continue working on health habits (diet/ exercise).  Please see past updates related to this goal by clicking on the "Past Updates" button in the selected goal          Diabetes  Recent Relevant Labs: Lab Results  Component Value Date/Time   HGBA1C 6.7 (A) 05/29/2018 01:19 PM   HGBA1C 6.5 11/29/2017 01:30 PM   HGBA1C 6.1 11/29/2016 01:59 PM   HGBA1C 6.9 (H) 02/27/2015 01:35 PM   MICROALBUR 1.0 11/29/2017 02:06 PM   MICROALBUR 0.8 11/29/2016 01:59 PM    Checking BG: Never  Patient has failed these meds in past: Januvia (falls)  Patient is currently controlled on the following medications:  - metformin ER 500mg , 4 tablets once daily   Last diabetic Eye exam:  Lab Results  Component Value  Date/Time   HMDIABEYEEXA No Retinopathy 02/12/2015 10:37 AM  -  Patient saw Dr. Nelson Chimes on 07/2019.  - recommended eye exams every 1 to 2 years.   Last diabetic Foot exam: No results found for: HMDIABFOOTEX  - recommended foot exams at least once yearly and daily home feet inspections  We discussed: diet and exercise extensively . Preventative diabetic health exams  Plan Continue current medications. Patient overdue for A1c. Patient requesting appointment with Dr. Clent Ridges.    Hypertension   Office blood pressures are  BP Readings from Last 3 Encounters:  05/29/18 122/62  11/29/17 (!) 162/78  05/29/17 132/80   Patient has failed these meds in the past: lisinopril, olmesartan   Patient checks BP at home never  Patient home BP readings are ranging: patient does not check at home. She denies dizziness/lightheadedness.   Patient  is controlled on:  - losartan 100mg , 1 tablet once daily  - furosemide 20mg , 1 tablet once daily   Plan  Denies dizziness/lightheadedness.  Continue current medications.  Need recent BP readings. Patient requesting in office appt with Dr. .    Hyperlipidemia/ arteriosclerosis of right carotid artery    Lipid Panel     Component Value Date/Time   CHOL 151 11/29/2017 1406   TRIG 123.0 11/29/2017 1406   TRIG 95 10/02/2006 1149   HDL 50.90 11/29/2017 1406   CHOLHDL 3 11/29/2017 1406   VLDL 24.6 11/29/2017 1406   LDLCALC 76 11/29/2017 1406    The ASCVD Risk score (Goff DC Jr., et al., 2013) failed to calculate for the following reasons:   The 2013 ASCVD risk score is only valid for ages 41 to 86   Patient has failed these meds in past: pravastatin Patient is currently controlled on the following medications:  - managed by diet   We discussed:  diet and exercise extensively . How to reduce cholesterol through diet/weight management and physical activity.    . We discussed how a diet high in plant sterols (fruits/vegetables/nuts/whole grains/legumes) may reduce your cholesterol.  Encouraged increasing fiber to a daily intake of 10-25g/day.   Plan Continue current medications and control with diet and exercise. Patient requested appointment with Dr. 41. Recommending lipid panel.   Hypothyroidism   TSH  Date Value Ref Range Status  11/29/2017 1.10 0.35 - 4.50 uIU/mL Final    Patient has failed these meds in past: none  Patient is currently controlled on the following medications:  - levothyroxine117mcg, 1 tablet once daily   Plan Patient takes same time everyday.  Continue current medications. Patient overdue to TSH. Recommending at next visit with Dr. 12/01/2017.   GERD   Patient has failed these meds in past: none  Patient is currently controlled on the following medications:  - omeprazole 20mg , 1 capsule daily   We discussed:  non-pharmacological interventions for acid  reflux. Take measures to prevent acid reflux, such as avoiding spicy foods, avoiding caffeine, avoid laying down a few hours after eating, and raising the head of the bed.  - Discussed risks with long term use of PPIs.   Plan Patient open to decreasing use.  Discussed taper plan with patient to decrease omeprazole 20mg , 1 capsule every other day for couple of weeks and then reassess if needs to go back on or completely stop.   Gout   Patient has failed these meds in past: colchicine Patient is currently controlled on the following medications:  - allopurinol 100mg , 1 tablet once daily  Plan Denies gout flare-ups (unclear how long ago).  Continue current medications.   Vaccines  Reviewed and discussed patient's vaccination history.    Immunization History  Administered Date(s) Administered  . Influenza Split 11/19/2011, 10/14/2012  . Influenza Whole 11/29/2007, 09/14/2009, 08/26/2010, 07/14/2011  . Influenza,inj,Quad PF,6+ Mos 10/01/2013, 09/08/2014, 10/30/2015, 07/29/2016  . Pneumococcal Conjugate-13 09/08/2014  . Pneumococcal Polysaccharide-23 10/14/2005  . Td 08/14/1996  . Tdap 09/08/2014  . Zoster 11/15/2011   Plan Patient obtained COVID vaccine.  Updated immunization chart.  [place] 1st dose: 12/09/19 2nd: 12/30/19  Medication Management  Barriers: denies issues obtaining medications from pharmacy.  Adherence: previous gaps in refill history (per dispense report), however, patient filled medications since then    Follow up Follow up visit with PharmD in 6 months Will conduct general telephone calls for periodic check-ins before next visit.  Sending message since patient requested appointment with Dr. Sarajane Jews (last visit 02/2019).   Anson Crofts, PharmD Clinical Pharmacist [facility] [phone]

## 2020-02-04 NOTE — Telephone Encounter (Signed)
Done

## 2020-02-05 NOTE — Telephone Encounter (Signed)
Left detailed message for patient to return call to office to schedule appointment with Dr. Clent Ridges.

## 2020-02-06 NOTE — Telephone Encounter (Signed)
Left detailed message informing pt to schedule CPE.

## 2020-02-13 ENCOUNTER — Other Ambulatory Visit: Payer: Self-pay | Admitting: Family Medicine

## 2020-03-25 ENCOUNTER — Other Ambulatory Visit: Payer: Self-pay

## 2020-03-26 ENCOUNTER — Ambulatory Visit (INDEPENDENT_AMBULATORY_CARE_PROVIDER_SITE_OTHER): Payer: PPO | Admitting: Family Medicine

## 2020-03-26 ENCOUNTER — Encounter: Payer: Self-pay | Admitting: Family Medicine

## 2020-03-26 VITALS — BP 130/70 | HR 83 | Temp 98.4°F | Wt 180.4 lb

## 2020-03-26 DIAGNOSIS — E1122 Type 2 diabetes mellitus with diabetic chronic kidney disease: Secondary | ICD-10-CM | POA: Diagnosis not present

## 2020-03-26 DIAGNOSIS — E039 Hypothyroidism, unspecified: Secondary | ICD-10-CM

## 2020-03-26 DIAGNOSIS — Z0000 Encounter for general adult medical examination without abnormal findings: Secondary | ICD-10-CM

## 2020-03-26 LAB — BASIC METABOLIC PANEL
BUN: 34 mg/dL — ABNORMAL HIGH (ref 6–23)
CO2: 33 mEq/L — ABNORMAL HIGH (ref 19–32)
Calcium: 9.5 mg/dL (ref 8.4–10.5)
Chloride: 98 mEq/L (ref 96–112)
Creatinine, Ser: 1.22 mg/dL — ABNORMAL HIGH (ref 0.40–1.20)
GFR: 41.51 mL/min — ABNORMAL LOW (ref >60.00)
Glucose, Bld: 120 mg/dL — ABNORMAL HIGH (ref 70–99)
Potassium: 4.3 mEq/L (ref 3.5–5.1)
Sodium: 138 mEq/L (ref 135–145)

## 2020-03-26 LAB — CBC WITH DIFFERENTIAL/PLATELET
Basophils Absolute: 0 10*3/uL (ref 0.0–0.1)
Basophils Relative: 0.7 % (ref 0.0–3.0)
Eosinophils Absolute: 0.1 10*3/uL (ref 0.0–0.7)
Eosinophils Relative: 1.1 % (ref 0.0–5.0)
HCT: 40.4 % (ref 36.0–46.0)
Hemoglobin: 13.5 g/dL (ref 12.0–15.0)
Lymphocytes Relative: 23.3 % (ref 12.0–46.0)
Lymphs Abs: 1.4 10*3/uL (ref 0.7–4.0)
MCHC: 33.3 g/dL (ref 30.0–36.0)
MCV: 91.3 fl (ref 78.0–100.0)
Monocytes Absolute: 0.7 10*3/uL (ref 0.1–1.0)
Monocytes Relative: 11.6 % (ref 3.0–12.0)
Neutro Abs: 3.9 10*3/uL (ref 1.4–7.7)
Neutrophils Relative %: 63.3 % (ref 43.0–77.0)
Platelets: 220 10*3/uL (ref 150.0–400.0)
RBC: 4.42 Mil/uL (ref 3.87–5.11)
RDW: 13 % (ref 11.5–15.5)
WBC: 6.2 10*3/uL (ref 4.0–10.5)

## 2020-03-26 LAB — HEPATIC FUNCTION PANEL
ALT: 9 U/L (ref 0–35)
AST: 17 U/L (ref 0–37)
Albumin: 4.2 g/dL (ref 3.5–5.2)
Alkaline Phosphatase: 43 U/L (ref 39–117)
Bilirubin, Direct: 0.1 mg/dL (ref 0.0–0.3)
Total Bilirubin: 0.6 mg/dL (ref 0.2–1.2)
Total Protein: 7 g/dL (ref 6.0–8.3)

## 2020-03-26 LAB — LIPID PANEL
Cholesterol: 148 mg/dL (ref 0–200)
HDL: 50.6 mg/dL (ref >39.00)
LDL Cholesterol: 79 mg/dL (ref 0–99)
NonHDL: 97.86
Total CHOL/HDL Ratio: 3
Triglycerides: 96 mg/dL (ref 0.0–149.0)
VLDL: 19.2 mg/dL (ref 0.0–40.0)

## 2020-03-26 LAB — HEMOGLOBIN A1C: Hgb A1c MFr Bld: 6.5 % (ref 4.6–6.5)

## 2020-03-26 LAB — T3, FREE: T3, Free: 2.6 pg/mL (ref 2.3–4.2)

## 2020-03-26 LAB — TSH: TSH: 3.06 u[IU]/mL (ref 0.35–4.50)

## 2020-03-26 LAB — T4, FREE: Free T4: 1.34 ng/dL (ref 0.60–1.60)

## 2020-03-26 MED ORDER — DICLOFENAC SODIUM 1 % EX GEL: 2.0000 g | Freq: Four times a day (QID) | CUTANEOUS | 11 refills | Status: AC

## 2020-03-26 NOTE — Progress Notes (Signed)
Subjective:    Patient ID: Destiny Flowers, female    DOB: Feb 15, 1929, 86 y.o.   MRN: [id number]  HPI Here for a well exam. She is doing well in general but she has arthritis pains in many areas including spine, knees, and hands. She lives alone in her house and she drives her car during the daytime. She has excellent family support with her brother and a number of nieces and nephews. Her BP is stable. She does not check her glucoses, but she is careful with her diet. She gets yearly eye exams with Dr. Hazle Quant.    Review of Systems  Constitutional: Negative.   HENT: Negative.   Eyes: Negative.   Respiratory: Negative.   Cardiovascular: Negative.   Gastrointestinal: Negative.   Genitourinary: Negative for decreased urine volume, difficulty urinating, dyspareunia, dysuria, enuresis, flank pain, frequency, hematuria, pelvic pain and urgency.  Musculoskeletal: Positive for arthralgias.  Skin: Negative.   Neurological: Negative.   Psychiatric/Behavioral: Negative.        Objective:   Physical Exam Constitutional:      General: She is not in acute distress.    Appearance: She is well-developed.     Comments: Walks with a walker   HENT:     Head: Normocephalic and atraumatic.     Right Ear: External ear normal.     Left Ear: External ear normal.     Nose: Nose normal.     Mouth/Throat:     Pharynx: No oropharyngeal exudate.  Eyes:     General: No scleral icterus.    Conjunctiva/sclera: Conjunctivae normal.     Pupils: Pupils are equal, round, and reactive to light.  Neck:     Thyroid: No thyromegaly.     Vascular: No JVD.  Cardiovascular:     Rate and Rhythm: Normal rate and regular rhythm.     Heart sounds: Normal heart sounds. No murmur. No friction rub. No gallop.   Pulmonary:     Effort: Pulmonary effort is normal. No respiratory distress.     Breath sounds: Normal breath sounds. No wheezing or rales.  Chest:     Chest wall: No tenderness.  Abdominal:     General:  Bowel sounds are normal. There is no distension.     Palpations: Abdomen is soft. There is no mass.     Tenderness: There is no abdominal tenderness. There is no guarding or rebound.  Musculoskeletal:        General: No tenderness. Normal range of motion.     Cervical back: Normal range of motion and neck supple.  Lymphadenopathy:     Cervical: No cervical adenopathy.  Skin:    General: Skin is warm and dry.     Findings: No erythema or rash.  Neurological:     Mental Status: She is alert and oriented to person, place, and time.     Cranial Nerves: No cranial nerve deficit.     Motor: No abnormal muscle tone.     Coordination: Coordination normal.     Deep Tendon Reflexes: Reflexes are normal and symmetric. Reflexes normal.  Psychiatric:        Behavior: Behavior normal.        Thought Content: Thought content normal.        Judgment: Judgment normal.           Assessment & Plan:  Well exam. We discussed diet and exercise. Get fasting labs. Try Voltaren gel for the arthritis pains.  Gershon Crane,  MD

## 2020-03-27 ENCOUNTER — Telehealth: Payer: Self-pay

## 2020-03-27 NOTE — Telephone Encounter (Signed)
ATC pt phone was busy Try again

## 2020-03-27 NOTE — Telephone Encounter (Signed)
Pt is on list of patients who have gotten or need Reclast infusion. Please clarify if you would like pt to still be getting Reclast?

## 2020-03-27 NOTE — Telephone Encounter (Signed)
Please refer to Dr. Ellison's response 

## 2020-03-27 NOTE — Telephone Encounter (Signed)
Ov is overdue, and is needed to address.  Please schedule f/u appt.

## 2020-04-08 NOTE — Telephone Encounter (Signed)
Called pt and left detailed voicemail requesting a call back to schedule an appointment.

## 2020-04-14 ENCOUNTER — Telehealth: Payer: Self-pay | Admitting: Family Medicine

## 2020-04-14 NOTE — Telephone Encounter (Signed)
Pt's niece, Verlee Monte (ok per DPR), stated that the pt would like her recent blood work results mailed to her. [address] [place] [address]

## 2020-04-14 NOTE — Telephone Encounter (Signed)
Spoke to [employer]	[place] and advised that letter will be mailed today. No further action needed. Also, went over lab results.

## 2020-04-24 NOTE — Telephone Encounter (Signed)
Enrique Sack pt returning your call for lab results

## 2020-04-24 NOTE — Telephone Encounter (Signed)
Spoke to Pt and she advised that someone has mailed the labs already and she was aware. Noted that I was the one who sent the labs and forgot. Pt verbaliZed understanding. Lab results were given to pt granddaughter 04/14/2020 and mailed. Closing note.

## 2020-05-08 ENCOUNTER — Other Ambulatory Visit: Payer: Self-pay | Admitting: Family Medicine

## 2020-05-21 DIAGNOSIS — M9902 Segmental and somatic dysfunction of thoracic region: Secondary | ICD-10-CM | POA: Diagnosis not present

## 2020-05-21 DIAGNOSIS — M9901 Segmental and somatic dysfunction of cervical region: Secondary | ICD-10-CM | POA: Diagnosis not present

## 2020-05-21 DIAGNOSIS — M9903 Segmental and somatic dysfunction of lumbar region: Secondary | ICD-10-CM | POA: Diagnosis not present

## 2020-05-21 DIAGNOSIS — M5134 Other intervertebral disc degeneration, thoracic region: Secondary | ICD-10-CM | POA: Diagnosis not present

## 2020-05-21 DIAGNOSIS — M5136 Other intervertebral disc degeneration, lumbar region: Secondary | ICD-10-CM | POA: Diagnosis not present

## 2020-05-21 DIAGNOSIS — M5032 Other cervical disc degeneration, mid-cervical region, unspecified level: Secondary | ICD-10-CM | POA: Diagnosis not present

## 2020-05-28 DIAGNOSIS — M9902 Segmental and somatic dysfunction of thoracic region: Secondary | ICD-10-CM | POA: Diagnosis not present

## 2020-05-28 DIAGNOSIS — M5032 Other cervical disc degeneration, mid-cervical region, unspecified level: Secondary | ICD-10-CM | POA: Diagnosis not present

## 2020-05-28 DIAGNOSIS — M9901 Segmental and somatic dysfunction of cervical region: Secondary | ICD-10-CM | POA: Diagnosis not present

## 2020-05-28 DIAGNOSIS — M5134 Other intervertebral disc degeneration, thoracic region: Secondary | ICD-10-CM | POA: Diagnosis not present

## 2020-05-28 DIAGNOSIS — M9903 Segmental and somatic dysfunction of lumbar region: Secondary | ICD-10-CM | POA: Diagnosis not present

## 2020-05-28 DIAGNOSIS — M5136 Other intervertebral disc degeneration, lumbar region: Secondary | ICD-10-CM | POA: Diagnosis not present

## 2020-06-04 DIAGNOSIS — M9901 Segmental and somatic dysfunction of cervical region: Secondary | ICD-10-CM | POA: Diagnosis not present

## 2020-06-04 DIAGNOSIS — M9902 Segmental and somatic dysfunction of thoracic region: Secondary | ICD-10-CM | POA: Diagnosis not present

## 2020-06-04 DIAGNOSIS — M5032 Other cervical disc degeneration, mid-cervical region, unspecified level: Secondary | ICD-10-CM | POA: Diagnosis not present

## 2020-06-04 DIAGNOSIS — M9903 Segmental and somatic dysfunction of lumbar region: Secondary | ICD-10-CM | POA: Diagnosis not present

## 2020-06-04 DIAGNOSIS — M5136 Other intervertebral disc degeneration, lumbar region: Secondary | ICD-10-CM | POA: Diagnosis not present

## 2020-06-04 DIAGNOSIS — M5134 Other intervertebral disc degeneration, thoracic region: Secondary | ICD-10-CM | POA: Diagnosis not present

## 2020-06-05 ENCOUNTER — Other Ambulatory Visit: Payer: Self-pay | Admitting: Family Medicine

## 2020-06-11 DIAGNOSIS — M9903 Segmental and somatic dysfunction of lumbar region: Secondary | ICD-10-CM | POA: Diagnosis not present

## 2020-06-11 DIAGNOSIS — M5134 Other intervertebral disc degeneration, thoracic region: Secondary | ICD-10-CM | POA: Diagnosis not present

## 2020-06-11 DIAGNOSIS — M9901 Segmental and somatic dysfunction of cervical region: Secondary | ICD-10-CM | POA: Diagnosis not present

## 2020-06-11 DIAGNOSIS — M5032 Other cervical disc degeneration, mid-cervical region, unspecified level: Secondary | ICD-10-CM | POA: Diagnosis not present

## 2020-06-11 DIAGNOSIS — M5136 Other intervertebral disc degeneration, lumbar region: Secondary | ICD-10-CM | POA: Diagnosis not present

## 2020-06-11 DIAGNOSIS — M9902 Segmental and somatic dysfunction of thoracic region: Secondary | ICD-10-CM | POA: Diagnosis not present

## 2020-06-18 DIAGNOSIS — M5134 Other intervertebral disc degeneration, thoracic region: Secondary | ICD-10-CM | POA: Diagnosis not present

## 2020-06-18 DIAGNOSIS — M9903 Segmental and somatic dysfunction of lumbar region: Secondary | ICD-10-CM | POA: Diagnosis not present

## 2020-06-18 DIAGNOSIS — M9901 Segmental and somatic dysfunction of cervical region: Secondary | ICD-10-CM | POA: Diagnosis not present

## 2020-06-18 DIAGNOSIS — M5136 Other intervertebral disc degeneration, lumbar region: Secondary | ICD-10-CM | POA: Diagnosis not present

## 2020-06-18 DIAGNOSIS — M5032 Other cervical disc degeneration, mid-cervical region, unspecified level: Secondary | ICD-10-CM | POA: Diagnosis not present

## 2020-06-18 DIAGNOSIS — M9902 Segmental and somatic dysfunction of thoracic region: Secondary | ICD-10-CM | POA: Diagnosis not present

## 2020-06-19 ENCOUNTER — Other Ambulatory Visit: Payer: Self-pay | Admitting: Family Medicine

## 2020-06-23 NOTE — Telephone Encounter (Signed)
Called pt and attempted to inform her that she is in need of another appt to discuss Reclast infusion. PT still did not answer, and voicemail message stated that in order to leave a message, and access code must be entered.   Because this is the third attempt in calling, a letter has been printed and will be mailed to the pt requesting a call to schedule.

## 2020-08-06 ENCOUNTER — Ambulatory Visit: Payer: PPO

## 2020-08-06 DIAGNOSIS — E1122 Type 2 diabetes mellitus with diabetic chronic kidney disease: Secondary | ICD-10-CM

## 2020-08-06 DIAGNOSIS — I10 Essential (primary) hypertension: Secondary | ICD-10-CM

## 2020-08-06 NOTE — Chronic Care Management (AMB) (Signed)
Chronic Care Management Pharmacy  Name: Destiny Flowers  MRN: [id number] DOB: 03-25-1929  Initial Questions: 1. Have you seen any other providers since your last visit? Yes - Gershon Crane  2. Any changes in your medicines or health? No   Chief Complaint/ HPI  Destiny Flowers,  86 y.o. , female presents for their Follow-Up CCM visit with the clinical pharmacist via telephone due to COVID-19 Pandemic.  PCP : Nelwyn Salisbury, MD  Their chronic conditions include: DM, HTN, HLD/arteriosclerosis of right carotid artery, Hypothyroidism, GERD, Gout  Office Visits: 03/26/2020 Gershon Crane, MD: Patient presented for annual follow up for chronic conditions. Patient reports arthritis in spine, knees, and hands. Prescribed Voltaren gel for arthritis pain. Labs completed - renal function slightly worse and BUN/SCr elevated. A1C decreased from 6.7 to 6.5.  Consult Visit: 02/28/2019- Endocrinology- Patient presented to Dr. Romero Belling, MD for DM follow up. Patient currently on metformin. She stopped Januvia due to falls. Per patient reported BGs are well-controlled. Patient to obtain a new primary care provider and follow up in 2 months.   Medications: Outpatient Encounter Medications as of 08/06/2020  Medication Sig  . allopurinol (ZYLOPRIM) 100 MG tablet Take 1 tablet by mouth once daily  . aspirin 81 MG tablet Take 81 mg by mouth daily.    . EUTHYROX 100 MCG tablet TAKE 1 TABLET BY MOUTH ONCE DAILY BEFORE BREAKFAST  . losartan (COZAAR) 100 MG tablet Take 1 tablet by mouth once daily  . metFORMIN (GLUCOPHAGE-XR) 500 MG 24 hr tablet TAKE 4 TABLETS BY MOUTH ONCE DAILY  . Multiple Vitamin (MULTIVITAMIN) tablet Take 1 tablet by mouth daily.    . diclofenac Sodium (VOLTAREN) 1 % GEL Apply 2 g topically 4 (four) times daily.  . folic acid (FOLVITE) 800 MCG tablet Take 800 mcg by mouth daily.  . furosemide (LASIX) 20 MG tablet Take 1 tablet by mouth once daily  . glucose blood (ONETOUCH VERIO) test  strip 1 each by Other route daily. And lancets 1/day  . Iron, Ferrous Sulfate, 325 (65 Fe) MG TABS Take 1 tablet by mouth daily.  . magnesium oxide (MAG-OX) 400 MG tablet Take 400 mg by mouth daily.  Marland Kitchen omeprazole (PRILOSEC OTC) 20 MG tablet Take 20 mg by mouth daily.  . traZODone (DESYREL) 100 MG tablet Take 1 tablet (100 mg total) by mouth daily with breakfast.  . TURMERIC PO Take 500 mg by mouth daily.  . vitamin B-12 (CYANOCOBALAMIN) 500 MCG tablet Take 500 mcg by mouth daily.  . Vitamin D, Cholecalciferol, 50 MCG (2000 UT) CAPS Take 1 capsule by mouth daily.   No facility-administered encounter medications on file as of 08/06/2020.    Current Diagnosis/Assessment:  Goals Addressed            This Visit's Progress   . Pharmacy Care Plan       CARE PLAN ENTRY  Current Barriers:  . Chronic Disease Management support, education, and care coordination needs related to HTN, HLD, DMII, and Arteriosclerosis of right carotid artery, hypothyroidism, GERD, gout   Pharmacist Clinical Goal(s):  Marland Kitchen Work with the care management team to address educational, disease management, and care coordination needs  . Call provider office for new or worsened signs and symptoms  . Call care management team with questions or concerns.  . Diabetes:  Marland Kitchen Maintain A1c < 7.0% . Current A1c: 6.5% (03/26/2020)  . Maintain up to date on diabetes preventative health (eye exams every 1 to 2 years  and foot exams at least once yearly).  . Blood pressure . Maintain blood pressure within goal of your provider (130/80 or 140/90)  . High cholesterol:  . Cholesterol goals: Total Cholesterol goal under 200, Triglycerides goal under 150, HDL goal above 40 (men) or above 50 (women), LDL goal under 100.  . Current cholesterol levels (03/26/2020): total cholesterol: 148; triglycerides: 96; HDL: 50; LDL: 79.  Marland Kitchen Hypothyroidism:  . Maintain TSH between 0.35 to 4.5uIU/ml. . Current TSH (03/26/2020): 3.06 Iu/ml . Gout . Continue  decrease gout flare-ups. . Acid reflux/ GERD . Minimize reflux symptoms.   Interventions: . Comprehensive medication review performed. . Evaluation of current treatment plans and patient's adherence to plan as established by provider . Assessed patient's education and care coordination needs . Provided disease specific education to patient . Diabetes . Discussed importance of diabetes preventative health exams. . Obtain diabetic eye exam every 1 to 2 years.  . Obtain at least once yearly foot exams. . Continue: metformin ER 500mg , 4 tablets once daily.  . Blood pressure . Discussed need to continue checking blood pressure at home.  . Discussed diet modifications. DASH diet:  following a diet emphasizing fruits and vegetables and low-fat dairy products along with whole grains, fish, poultry, and nuts. Reducing red meats and sugars.  . Exercising   . Reducing the amount of salt intake to 1500mg /per day.  . Recommend using a salt substitute to replace your salt if you need flavor.    . Discussed the importance of monitoring blood pressure at home . Continue: losartan 100mg , 1 tablet once daily and furosemide 20mg , 1 tablet once daily  . High cholesterol . How to reduce cholesterol through diet/weight management and physical activity.    . We discussed how a diet high in plant sterols (fruits/vegetables/nuts/whole grains/legumes) may reduce your cholesterol.  Encouraged increasing fiber to a daily intake of 10-25g/day  . Continue: with diet modifications.  . Hypothyroidism . Continue:  levothyroxine122mcg, 1 tablet once daily . Acid reflux/ GERD . Discussed non-pharmacological interventions for acid reflux. Take measures to prevent acid reflux, such as avoiding spicy foods, avoiding caffeine, avoid laying down a few hours after eating, and raising the head of the bed. . Gout . Continue: allopurinol 100mg , 1 tablet once daily   Patient Self Care Activities:  . Self administers  medications as prescribed and Calls provider office for new concerns or questions . Continue current medications as directed by providers.  . Continue working on health habits (diet/ exercise).  Please see past updates related to this goal by clicking on the "Past Updates" button in the selected goal         SDOH Interventions     Most Recent Value  SDOH Interventions  Financial Strain Interventions Intervention Not Indicated      Diabetes  A1C goal of < 7  Recent Relevant Labs: Lab Results  Component Value Date/Time   HGBA1C 6.5 03/26/2020 09:49 AM   HGBA1C 6.7 (A) 05/29/2018 01:19 PM   HGBA1C 6.5 11/29/2017 01:30 PM   HGBA1C 6.1 11/29/2016 01:59 PM   MICROALBUR 1.0 11/29/2017 02:06 PM   MICROALBUR 0.8 11/29/2016 01:59 PM    Checking BG: Never  Patient has failed these meds in past: Januvia (falls)  Patient is currently controlled on the following medications:  - metformin ER 500mg , 4 tablets once daily   Last diabetic Eye exam:  Lab Results  Component Value Date/Time   HMDIABEYEEXA No Retinopathy 02/12/2015 10:37 AM  -  Patient saw Dr. Nelson Chimes on 07/2019.  - recommended eye exams every 1 to 2 years.   Last diabetic Foot exam: No results found for: HMDIABFOOTEX  - recommended foot exams at least once yearly and daily home feet inspections  We discussed: diet and exercise extensively and how to recognize and treat signs of hypoglycemia . Low BGs: we discussed checking blood sugars if patient feels symptoms of low BGs  Plan Continue current medications.  Hypertension   Office blood pressures are  BP Readings from Last 3 Encounters:  03/26/20 130/70  05/29/18 122/62  11/29/17 (!) 162/78   Patient has failed these meds in the past: lisinopril, olmesartan   Patient checks BP at home never  Patient home BP readings are ranging: patient does not check at home. She denies dizziness/lightheadedness.   Patient is controlled on:  - losartan 100mg , 1 tablet  once daily  - furosemide 20mg , 1 tablet once daily   We discussed: the importance of regularly checking blood pressure at home  Plan  Denies dizziness/lightheadedness.  Recommended checking blood pressure at home once weekly to determine if medications are working appropriately. Continue current medications.   Hyperlipidemia/ arteriosclerosis of right carotid artery    Lipid Panel     Component Value Date/Time   CHOL 148 03/26/2020 0949   TRIG 96.0 03/26/2020 0949   TRIG 95 10/02/2006 1149   HDL 50.60 03/26/2020 0949   CHOLHDL 3 03/26/2020 0949   VLDL 19.2 03/26/2020 0949   LDLCALC 79 03/26/2020 0949    The ASCVD Risk score (Goff DC Jr., et al., 2013) failed to calculate for the following reasons:   The 2013 ASCVD risk score is only valid for ages 36 to 25   Patient has failed these meds in past: pravastatin Patient is currently controlled on the following medications:  - managed by diet   We discussed:  diet and exercise extensively . How to reduce cholesterol through diet/weight management and physical activity.    . We discussed how a diet high in plant sterols (fruits/vegetables/nuts/whole grains/legumes) may reduce your cholesterol.  Encouraged increasing fiber to a daily intake of 10-25g/day.   Plan Continue current medications and control with diet and exercise.   Hypothyroidism   TSH  Date Value Ref Range Status  03/26/2020 3.06 0.35 - 4.50 uIU/mL Final    Patient has failed these meds in past: none  Patient is currently controlled on the following medications:  - levothyroxine164mcg, 1 tablet once daily   Plan Patient takes same time everyday.  Continue current medications.  GERD   Patient successfully tapered herself off omeprazole per instruction from previous pharmacist. Pt reported no heartburn symptoms. Patient has failed these meds in past: none  Patient is currently controlled on the following medications:  - no medications  We discussed:   non-pharmacological interventions for acid reflux. Take measures to prevent acid reflux, such as avoiding spicy foods, avoiding caffeine, avoid laying down a few hours after eating, and raising the head of the bed.; discussed using PRN tums if needed  Plan  Continue without medications.  Gout    Lab Results  Component Value Date   LABURIC 5.3 11/29/2017    Patient has failed these meds in past: colchicine Patient is currently controlled on the following medications:  - allopurinol 100mg , 1 tablet once daily    Plan Recommend repeat uric acid level to determine if at goal < 6. Continue current medications.   Neck pain   Patient is  currently uncontrolled on the following medications:  . Diclofenac 1% gel - patient has not been using  We discussed:  Using diclofenac gel as needed for joint pain (may help with neck pain); we discussed the benefits of topical therapy vs oral NSAIDs  Plan Continue current management.  Osteopenia / Osteoporosis   Last DEXA Scan: 11/2015  T-Score femoral neck: RFN -2.1, LFN -1.7  T-Score lumbar spine: + 0.7  10-year probability of major osteoporotic fracture: 15.2%  10-year probability of hip fracture: 4.6%  VITD  Date Value Ref Range Status  11/29/2017 63.15 30.00 - 100.00 ng/mL Final     Patient is a candidate for pharmacologic treatment due to T-Score -1.0 to -2.5 and 10-year risk of hip fracture > 3%  Patient has failed these meds in past: Reclast (stopping going to Dr. Everardo AllEllison) Patient is currently uncontrolled on the following medications:  Marland Kitchen. Vitamin D 2000 units 1 capsule daily  We discussed:  Recommend [phone] units of vitamin D daily. Recommend 1200 mg of calcium daily from dietary and supplemental sources. returning to Dr. Everardo AllEllison to continue treatment  Plan Will discuss with Dr. Clent RidgesFry as patient does not wish to return to Dr. George HughEllison's office and is indicated for treatment. Continue current medications  Vaccines  Reviewed and  discussed patient's vaccination history.    Immunization History  Administered Date(s) Administered  . Influenza Split 11/19/2011, 10/14/2012  . Influenza Whole 11/29/2007, 09/14/2009, 08/26/2010, 07/14/2011  . Influenza,inj,Quad PF,6+ Mos 10/01/2013, 09/08/2014, 10/30/2015, 07/29/2016  . PFIZER SARS-COV-2 Vaccination 12/09/2019, 12/30/2019  . Pneumococcal Conjugate-13 09/08/2014  . Pneumococcal Polysaccharide-23 10/14/2005  . Td 08/14/1996  . Tdap 09/08/2014  . Zoster 11/15/2011   Plan Recommended for patient to receive Shingrix and influenza vaccine at pharmacy/in office.  Medication Management   Pt uses [facility] for all medications Uses pill box? Yes Pt endorses 99% compliance (once a year)  We discussed: Discussed benefits of medication synchronization, packaging and delivery as well as enhanced pharmacist oversight with [employer].  Plan:  Continue current medication management strategy   Follow up: 6 month phone visit   Gaylord ShihMadeline Shana Younge, PharmD Clinical Pharmacist  [facility] [phone]

## 2020-08-07 ENCOUNTER — Telehealth: Payer: Self-pay

## 2020-08-07 NOTE — Telephone Encounter (Signed)
Called patient to schedule appointment and there was no answer.

## 2020-08-07 NOTE — Telephone Encounter (Signed)
-----   Message from Verner Chol, [facility] sent at 08/06/2020 11:22 AM EDT ----- Regarding: Appointment with Dr. Ebony Hail,  I just spoke with Ms. Lambert Mody and she requested an appointment with Dr. Clent Ridges just for follow up for her chronic conditions. Can you please call her to set that up?  Thank you, Maddie

## 2020-08-09 NOTE — Patient Instructions (Signed)
Visit Information  Goals Addressed            This Visit's Progress   . Pharmacy Care Plan       CARE PLAN ENTRY  Current Barriers:  . Chronic Disease Management support, education, and care coordination needs related to HTN, HLD, DMII, and Arteriosclerosis of right carotid artery, hypothyroidism, GERD, gout   Pharmacist Clinical Goal(s):  Marland Kitchen Work with the care management team to address educational, disease management, and care coordination needs  . Call provider office for new or worsened signs and symptoms  . Call care management team with questions or concerns.  . Diabetes:  Marland Kitchen Maintain A1c < 7.0% . Current A1c: 6.5% (03/26/2020)  . Maintain up to date on diabetes preventative health (eye exams every 1 to 2 years and foot exams at least once yearly).  . Blood pressure . Maintain blood pressure within goal of your provider (130/80 or 140/90)  . High cholesterol:  . Cholesterol goals: Total Cholesterol goal under 200, Triglycerides goal under 150, HDL goal above 40 (men) or above 50 (women), LDL goal under 100.  . Current cholesterol levels (03/26/2020): total cholesterol: 148; triglycerides: 96; HDL: 50; LDL: 79.  Marland Kitchen Hypothyroidism:  . Maintain TSH between 0.35 to 4.5uIU/ml. . Current TSH (03/26/2020): 3.06 Iu/ml . Gout . Continue decrease gout flare-ups. . Acid reflux/ GERD . Minimize reflux symptoms.   Interventions: . Comprehensive medication review performed. . Evaluation of current treatment plans and patient's adherence to plan as established by provider . Assessed patient's education and care coordination needs . Provided disease specific education to patient . Diabetes . Discussed importance of diabetes preventative health exams. . Obtain diabetic eye exam every 1 to 2 years.  . Obtain at least once yearly foot exams. . Continue: metformin ER 500mg , 4 tablets once daily.  . Blood pressure . Discussed need to continue checking blood pressure at home.  . Discussed  diet modifications. DASH diet:  following a diet emphasizing fruits and vegetables and low-fat dairy products along with whole grains, fish, poultry, and nuts. Reducing red meats and sugars.  . Exercising   . Reducing the amount of salt intake to 1500mg /per day.  . Recommend using a salt substitute to replace your salt if you need flavor.    . Discussed the importance of monitoring blood pressure at home . Continue: losartan 100mg , 1 tablet once daily and furosemide 20mg , 1 tablet once daily  . High cholesterol . How to reduce cholesterol through diet/weight management and physical activity.    . We discussed how a diet high in plant sterols (fruits/vegetables/nuts/whole grains/legumes) may reduce your cholesterol.  Encouraged increasing fiber to a daily intake of 10-25g/day  . Continue: with diet modifications.  . Hypothyroidism . Continue:  levothyroxine180mcg, 1 tablet once daily . Acid reflux/ GERD . Discussed non-pharmacological interventions for acid reflux. Take measures to prevent acid reflux, such as avoiding spicy foods, avoiding caffeine, avoid laying down a few hours after eating, and raising the head of the bed. . Gout . Continue: allopurinol 100mg , 1 tablet once daily   Patient Self Care Activities:  . Self administers medications as prescribed and Calls provider office for new concerns or questions . Continue current medications as directed by providers.  . Continue working on health habits (diet/ exercise).  Please see past updates related to this goal by clicking on the "Past Updates" button in the selected goal         The patient verbalized understanding of  instructions provided today and declined a print copy of patient instruction materials.   Telephone follow up appointment with pharmacy team member scheduled for: 6 months  Gaylord Shih, PharmD Clinical Pharmacist [facility] [place] [phone]

## 2020-08-13 ENCOUNTER — Ambulatory Visit: Payer: PPO | Attending: Internal Medicine

## 2020-08-13 DIAGNOSIS — Z23 Encounter for immunization: Secondary | ICD-10-CM

## 2020-08-13 NOTE — Progress Notes (Signed)
   Covid-19 Vaccination Clinic  Name:  Destiny Flowers    MRN: [id number] DOB: 1929-10-15  08/13/2020  [name] was observed post Covid-19 immunization for 15 minutes without incident. She was provided with Vaccine Information Sheet and instruction to access the V-Safe system.   [name] was instructed to call 911 with any severe reactions post vaccine: Marland Kitchen Difficulty breathing  . Swelling of face and throat  . A fast heartbeat  . A bad rash all over body  . Dizziness and weakness

## 2020-08-27 ENCOUNTER — Telehealth: Payer: Self-pay | Admitting: Family Medicine

## 2020-08-27 NOTE — Telephone Encounter (Signed)
Left message for patient to schedule Annual Wellness Visit.  Please schedule with Nurse Health Advisor Shannon Crews, RN at [facility]  

## 2020-09-21 ENCOUNTER — Other Ambulatory Visit: Payer: Self-pay | Admitting: Family Medicine

## 2020-09-24 DIAGNOSIS — L218 Other seborrheic dermatitis: Secondary | ICD-10-CM | POA: Diagnosis not present

## 2020-09-24 DIAGNOSIS — L308 Other specified dermatitis: Secondary | ICD-10-CM | POA: Diagnosis not present

## 2020-10-01 DIAGNOSIS — E1151 Type 2 diabetes mellitus with diabetic peripheral angiopathy without gangrene: Secondary | ICD-10-CM | POA: Diagnosis not present

## 2020-10-01 DIAGNOSIS — M79674 Pain in right toe(s): Secondary | ICD-10-CM | POA: Diagnosis not present

## 2020-10-01 DIAGNOSIS — L84 Corns and callosities: Secondary | ICD-10-CM | POA: Diagnosis not present

## 2020-10-01 DIAGNOSIS — B351 Tinea unguium: Secondary | ICD-10-CM | POA: Diagnosis not present

## 2020-10-22 DIAGNOSIS — Z961 Presence of intraocular lens: Secondary | ICD-10-CM | POA: Diagnosis not present

## 2020-10-22 DIAGNOSIS — E119 Type 2 diabetes mellitus without complications: Secondary | ICD-10-CM | POA: Diagnosis not present

## 2020-10-22 DIAGNOSIS — H04123 Dry eye syndrome of bilateral lacrimal glands: Secondary | ICD-10-CM | POA: Diagnosis not present

## 2020-10-22 DIAGNOSIS — H524 Presbyopia: Secondary | ICD-10-CM | POA: Diagnosis not present

## 2020-10-22 LAB — HM DIABETES EYE EXAM

## 2020-12-22 ENCOUNTER — Ambulatory Visit: Payer: PPO

## 2020-12-22 ENCOUNTER — Other Ambulatory Visit: Payer: Self-pay

## 2020-12-22 DIAGNOSIS — Z0000 Encounter for general adult medical examination without abnormal findings: Secondary | ICD-10-CM

## 2020-12-28 ENCOUNTER — Other Ambulatory Visit: Payer: Self-pay | Admitting: Family Medicine

## 2020-12-31 DIAGNOSIS — R601 Generalized edema: Secondary | ICD-10-CM | POA: Diagnosis not present

## 2020-12-31 DIAGNOSIS — M7989 Other specified soft tissue disorders: Secondary | ICD-10-CM | POA: Diagnosis not present

## 2020-12-31 DIAGNOSIS — M79674 Pain in right toe(s): Secondary | ICD-10-CM | POA: Diagnosis not present

## 2020-12-31 DIAGNOSIS — M79662 Pain in left lower leg: Secondary | ICD-10-CM | POA: Diagnosis not present

## 2020-12-31 DIAGNOSIS — L03122 Acute lymphangitis of left axilla: Secondary | ICD-10-CM | POA: Diagnosis not present

## 2020-12-31 DIAGNOSIS — B351 Tinea unguium: Secondary | ICD-10-CM | POA: Diagnosis not present

## 2021-01-15 ENCOUNTER — Other Ambulatory Visit: Payer: Self-pay

## 2021-01-15 ENCOUNTER — Ambulatory Visit (INDEPENDENT_AMBULATORY_CARE_PROVIDER_SITE_OTHER): Payer: PPO | Admitting: Family Medicine

## 2021-01-15 ENCOUNTER — Encounter: Payer: Self-pay | Admitting: Family Medicine

## 2021-01-15 VITALS — BP 128/70 | HR 82 | Temp 98.2°F | Wt 173.6 lb

## 2021-01-15 DIAGNOSIS — I10 Essential (primary) hypertension: Secondary | ICD-10-CM | POA: Diagnosis not present

## 2021-01-15 DIAGNOSIS — Z86718 Personal history of other venous thrombosis and embolism: Secondary | ICD-10-CM | POA: Diagnosis not present

## 2021-01-15 DIAGNOSIS — R600 Localized edema: Secondary | ICD-10-CM | POA: Diagnosis not present

## 2021-01-15 LAB — CBC WITH DIFFERENTIAL/PLATELET
Basophils Absolute: 0 10*3/uL (ref 0.0–0.1)
Basophils Relative: 0.7 % (ref 0.0–3.0)
Eosinophils Absolute: 0.1 10*3/uL (ref 0.0–0.7)
Eosinophils Relative: 2.1 % (ref 0.0–5.0)
HCT: 38.5 % (ref 36.0–46.0)
Hemoglobin: 12.8 g/dL (ref 12.0–15.0)
Lymphocytes Relative: 23.9 % (ref 12.0–46.0)
Lymphs Abs: 1.5 10*3/uL (ref 0.7–4.0)
MCHC: 33.2 g/dL (ref 30.0–36.0)
MCV: 90.3 fl (ref 78.0–100.0)
Monocytes Absolute: 0.6 10*3/uL (ref 0.1–1.0)
Monocytes Relative: 10.1 % (ref 3.0–12.0)
Neutro Abs: 3.9 10*3/uL (ref 1.4–7.7)
Neutrophils Relative %: 63.2 % (ref 43.0–77.0)
Platelets: 202 10*3/uL (ref 150.0–400.0)
RBC: 4.26 Mil/uL (ref 3.87–5.11)
RDW: 13 % (ref 11.5–15.5)
WBC: 6.1 10*3/uL (ref 4.0–10.5)

## 2021-01-15 LAB — BASIC METABOLIC PANEL
BUN: 32 mg/dL — ABNORMAL HIGH (ref 6–23)
CO2: 29 mEq/L (ref 19–32)
Calcium: 10 mg/dL (ref 8.4–10.5)
Chloride: 100 mEq/L (ref 96–112)
Creatinine, Ser: 1.16 mg/dL (ref 0.40–1.20)
GFR: 41.73 mL/min — ABNORMAL LOW (ref >60.00)
Glucose, Bld: 82 mg/dL (ref 70–99)
Potassium: 4.7 mEq/L (ref 3.5–5.1)
Sodium: 140 mEq/L (ref 135–145)

## 2021-01-15 LAB — TSH: TSH: 0.48 u[IU]/mL (ref 0.35–4.50)

## 2021-01-15 LAB — BRAIN NATRIURETIC PEPTIDE: Pro B Natriuretic peptide (BNP): 66 pg/mL (ref 0.0–100.0)

## 2021-01-15 MED ORDER — FUROSEMIDE 40 MG PO TABS: 40.0000 mg | ORAL_TABLET | Freq: Every day | ORAL | 11 refills | Status: AC

## 2021-01-15 NOTE — Progress Notes (Signed)
   Subjective:    Patient ID: Destiny Flowers, female    DOB: [birth year], 87 y.o.   MRN: [id number]  HPI Here with her niece for swelling in both legs. This has been building up for the past 6 months. They are sometimes painful. No SOB. She tires to elevate them when she can. She saw her podiatrist a few weeks ago and he apparently felt there was some cellulitis, because he gave her 10 days of Doxycycline. He also sent her for venous dopplers that were negative for any DVT. She has been taking Lasix 20 mg daily as usual. She admits to eating 1 or 2 Canjun chicken biscuits from [employer] several days a week and consuming more salty food then usual.    Review of Systems  Constitutional: Negative.   Respiratory: Negative.   Cardiovascular: Positive for leg swelling. Negative for chest pain and palpitations.  Gastrointestinal: Negative.   Genitourinary: Negative.        Objective:   Physical Exam Constitutional:      Appearance: Normal appearance.     Comments: Walks with a walker   Cardiovascular:     Rate and Rhythm: Normal rate and regular rhythm.     Pulses: Normal pulses.     Heart sounds: Normal heart sounds.  Pulmonary:     Effort: Pulmonary effort is normal.     Breath sounds: Normal breath sounds.  Musculoskeletal:     Comments: Both legs have 3+ edema. They are mildly tender. No warmth. The color is dusky but not red   Neurological:     Mental Status: She is alert.           Assessment & Plan:  Venous insufficiency. We will increase the Lasix to 40 mg daily. Elevate the legs. We will check labs to day for renal function, etc. She will reduce her sodium intake.  Gershon Crane, MD

## 2021-01-19 ENCOUNTER — Other Ambulatory Visit: Payer: Self-pay | Admitting: Family Medicine

## 2021-01-21 ENCOUNTER — Telehealth: Payer: Self-pay | Admitting: Family Medicine

## 2021-01-21 NOTE — Telephone Encounter (Signed)
Spoke with niece about labs

## 2021-01-21 NOTE — Telephone Encounter (Signed)
Patient niece Destiny Flowers is calling to get the results of the blood work that was done on 03/04.  She states the swelling has been better.  Please advise.

## 2021-01-21 NOTE — Telephone Encounter (Signed)
Called Dura niece, release on file 06/03/19. LVM to call back for results

## 2021-01-29 ENCOUNTER — Telehealth: Payer: Self-pay | Admitting: Pharmacist

## 2021-01-29 NOTE — Chronic Care Management (AMB) (Signed)
I left the patient a message about her upcoming appointment on 02/02/2020 @ 10:00 amwith the clinical pharmacist. She was asked to please have all medication on hand to review with the pharmacist.  Tresa Garter) Bascom Levels, [facility] Clinical Pharmacy Assistant [phone]

## 2021-02-01 ENCOUNTER — Telehealth: Payer: PPO

## 2021-02-01 NOTE — Progress Notes (Deleted)
Chronic Care Management Pharmacy Note  02/01/2021 Name:  Destiny Flowers MRN:  [id number] DOB:  Aug 17, 1929  Subjective: Destiny Flowers is an 87 y.o. year old female who is a primary patient of Laurey Morale, MD.  The CCM team was consulted for assistance with disease management and care coordination needs.    Engaged with patient by telephone for follow up visit in response to provider referral for pharmacy case management and/or care coordination services.   Consent to Services:  The patient was given information about Chronic Care Management services, agreed to services, and gave verbal consent prior to initiation of services.  Please see initial visit note for detailed documentation.   Patient Care Team: Laurey Morale, MD as PCP - General (Family Medicine) Deneise Lever, MD as Consulting Physician (Pulmonary Disease) Calvert Cantor, MD as Consulting Physician (Ophthalmology) Viona Gilmore, [facility] as Pharmacist (Pharmacist)  Recent office visits: 01/15/21 Alysia Penna, MD: Patient presented with edema. Increased the furosemide to 40 mg daily.   03/26/2020 Alysia Penna, MD: Patient presented for annual follow up for chronic conditions. Patient reports arthritis in spine, knees, and hands. Prescribed Voltaren gel for arthritis pain. Labs completed - renal function slightly worse and BUN/SCr elevated. A1C decreased from 6.7 to 6.5.  Recent consult visits: 12/31/20 Patient presented for doppler of left lower leg. Negative for DVT.  10/22/20 Calvert Cantor (ophthalmology): Patient presented for eye exam. Unable to access notes.  10/01/20 Steffanie Rainwater (podiatry): Patient presented for debridement of nails.  02/28/2019- Endocrinology- Patient presented to Dr. Renato Shin, MD for DM follow up. Patient currently on metformin. She stopped Januvia due to falls. Per patient reported BGs are well-controlled. Patient to obtain a new primary care provider and follow up in 2 months.   Hospital  visits: None in previous 6 months  Objective:  Lab Results  Component Value Date   CREATININE 1.16 01/15/2021   BUN 32 (H) 01/15/2021   GFR 41.73 (L) 01/15/2021   GFRNONAA 56.84 04/30/2010   GFRAA 69 12/09/2008   NA 140 01/15/2021   K 4.7 01/15/2021   CALCIUM 10.0 01/15/2021   CO2 29 01/15/2021   GLUCOSE 82 01/15/2021    Lab Results  Component Value Date/Time   HGBA1C 6.5 03/26/2020 09:49 AM   HGBA1C 6.7 (A) 05/29/2018 01:19 PM   HGBA1C 6.5 11/29/2017 01:30 PM   HGBA1C 6.1 11/29/2016 01:59 PM   GFR 41.73 (L) 01/15/2021 01:37 PM   GFR 41.51 (L) 03/26/2020 09:49 AM   MICROALBUR 1.0 11/29/2017 02:06 PM   MICROALBUR 0.8 11/29/2016 01:59 PM    Last diabetic Eye exam:  Lab Results  Component Value Date/Time   HMDIABEYEEXA No Retinopathy 02/12/2015 10:37 AM    Last diabetic Foot exam: No results found for: HMDIABFOOTEX   Lab Results  Component Value Date   CHOL 148 03/26/2020   HDL 50.60 03/26/2020   LDLCALC 79 03/26/2020   TRIG 96.0 03/26/2020   CHOLHDL 3 03/26/2020    Hepatic Function Latest Ref Rng & Units 03/26/2020 11/29/2017 11/29/2016  Total Protein 6.0 - 8.3 g/dL 7.0 6.9 6.8  Albumin 3.5 - 5.2 g/dL 4.2 4.0 4.0  AST 0 - 37 U/L _0 ALT 0 - 35 U/L _1 Alk Phosphatase 39 - 117 U/L 43 37(L) 33(L)  Total Bilirubin 0.2 - 1.2 mg/dL 0.6 0.4 0.3  Bilirubin, Direct 0.0 - 0.3 mg/dL 0.1 0.0 0.1    Lab Results  Component Value Date/Time  TSH 0.48 01/15/2021 01:37 PM   TSH 3.06 03/26/2020 09:49 AM   FREET4 1.34 03/26/2020 09:49 AM    CBC Latest Ref Rng & Units 01/15/2021 03/26/2020 11/29/2017  WBC 4.0 - 10.5 K/uL 6.1 6.2 7.1  Hemoglobin 12.0 - 15.0 g/dL 12.8 13.5 12.7  Hematocrit 36.0 - 46.0 % 38.5 40.4 38.9  Platelets 150.0 - 400.0 K/uL 202.0 220.0 274.0    Lab Results  Component Value Date/Time   VD25OH 63.15 11/29/2017 02:06 PM   VD25OH 64.84 11/29/2016 01:59 PM    Clinical ASCVD: {YES/NO:21197} The ASCVD Risk score Mikey Bussing DC Jr., et al., 2013)  failed to calculate for the following reasons:   The 2013 ASCVD risk score is only valid for ages 79 to 20    Depression screen PHQ 2/9 04/22/2016  Decreased Interest 0  Down, Depressed, Hopeless 0  PHQ - 2 Score 0     Social History   Tobacco Use  Smoking Status Passive Smoke Exposure - Never Smoker  Smokeless Tobacco Never Used   BP Readings from Last 3 Encounters:  01/15/21 128/70  03/26/20 130/70  05/29/18 122/62   Pulse Readings from Last 3 Encounters:  01/15/21 82  03/26/20 83  05/29/18 67   Wt Readings from Last 3 Encounters:  01/15/21 173 lb 9.6 oz (78.7 kg)  03/26/20 180 lb 6.4 oz (81.8 kg)  05/29/18 200 lb (90.7 kg)   BMI Readings from Last 3 Encounters:  01/15/21 29.34 kg/m  03/26/20 30.49 kg/m  05/29/18 33.80 kg/m    Assessment/Interventions: Review of patient past medical history, allergies, medications, health status, including review of consultants reports, laboratory and other test data, was performed as part of comprehensive evaluation and provision of chronic care management services.   SDOH:  (Social Determinants of Health) assessments and interventions performed: {yes/no:20286}   CCM Care Plan  Allergies  Allergen Reactions  . Januvia [Sitagliptin]     falls  . Penicillins Other (See Comments)    Unk    Medications Reviewed Today    Reviewed by Laurey Morale, MD (Physician) on 01/15/21 at 1328  Med List Status: <None>  Medication Order Taking? Sig Documenting Provider Last Dose Status Informant  allopurinol (ZYLOPRIM) 100 MG tablet [id number] Yes Take 1 tablet by mouth once daily Laurey Morale, MD Taking Active   aspirin 81 MG tablet [id number] Yes Take 81 mg by mouth daily. [provider] Taking Active Self  diclofenac Sodium (VOLTAREN) 1 % GEL [id number] Yes Apply 2 g topically 4 (four) times daily. Laurey Morale, MD Taking Active   EUTHYROX 100 MCG tablet [id number] Yes TAKE 1 TABLET BY MOUTH ONCE DAILY BEFORE Ihor Austin, MD Taking Active   folic acid (FOLVITE) 010 MCG tablet [id number] Yes Take 800 mcg by mouth daily. [provider] Taking Active   furosemide (LASIX) 20 MG tablet [id number] Yes Take 1 tablet by mouth once daily Laurey Morale, MD Taking Active   glucose blood [facility] VERIO) test strip [id number] Yes 1 each by Other route daily. And lancets 1/day Renato Shin, MD Taking Active   Iron, Ferrous Sulfate, 325 (65 Fe) MG TABS [id number] Yes Take 1 tablet by mouth daily. [provider] Taking Active Self  losartan (COZAAR) 100 MG tablet [id number] Yes Take 1 tablet by mouth once daily Laurey Morale, MD Taking Active   magnesium oxide (MAG-OX) 400 MG tablet [id number] Yes Take 400 mg by mouth daily. [provider] Taking Active   metFORMIN (  GLUCOPHAGE-XR) 500 MG 24 hr tablet [id number] Yes TAKE 4 TABLETS BY MOUTH ONCE DAILY Laurey Morale, MD Taking Active   Multiple Vitamin (MULTIVITAMIN) tablet [id number] Yes Take 1 tablet by mouth daily. [provider] Taking Active Self  omeprazole (PRILOSEC OTC) 20 MG tablet [id number] Yes Take 20 mg by mouth daily. [provider] Taking Active Self  traZODone (DESYREL) 100 MG tablet [id number] Yes Take 1 tablet (100 mg total) by mouth daily with breakfast. Renato Shin, MD Taking Active   TURMERIC PO [id number] Yes Take 500 mg by mouth daily. [provider] Taking Active Self  vitamin B-12 (CYANOCOBALAMIN) 500 MCG tablet [id number] Yes Take 500 mcg by mouth daily. [provider] Taking Active Self  Vitamin D, Cholecalciferol, 50 MCG (2000 UT) CAPS [id number] Yes Take 1 capsule by mouth daily. [provider] Taking Active Self  Med List Note Annamaria Boots, Kasandra Knudsen, MD 07/18/11 1246): CPAP 11          Patient Active Problem List   Diagnosis Date Noted  . Bilateral leg edema 01/15/2021  . Hearing loss 11/29/2017  . Neck pain 03/07/2017  . Back pain 03/07/2017  . Arteriosclerosis of right  carotid artery 03/07/2017  . Myalgia and myositis 10/30/2015  . edema 04/04/2014  . Gout 07/02/2012  . Anemia 07/02/2012  . Homocysteinemia 07/02/2012  . Vitamin D deficiency 07/02/2012  . Weight loss 11/24/2011  . Bronchitis, chronic obstructive w acute bronchitis ([place]) 11/13/2011  . History of DVT of lower extremity 07/14/2011  . INSOMNIA 05/31/2010  . SYNCOPE 10/05/2009  . FATIGUE 10/05/2009  . HYPERCHOLESTEROLEMIA 02/19/2008  . Obstructive sleep apnea 02/06/2008  . Hypothyroidism 06/06/2007  . Diabetes ([place]) 06/06/2007  . Essential hypertension 06/06/2007  . Seasonal and perennial allergic rhinitis 06/06/2007  . GERD 06/06/2007  . Disorder resulting from impaired renal function 06/06/2007  . Osteoarthritis 06/06/2007    Immunization History  Administered Date(s) Administered  . Influenza Split 11/19/2011, 10/14/2012  . Influenza Whole 11/29/2007, 09/14/2009, 08/26/2010, 07/14/2011  . Influenza, High Dose Seasonal PF 10/20/2017, 09/15/2018, 07/30/2019  . Influenza,inj,Quad PF,6+ Mos 10/01/2013, 09/08/2014, 10/30/2015, 07/29/2016  . PFIZER(Purple Top)SARS-COV-2 Vaccination 12/09/2019, 12/30/2019, 08/13/2020  . Pneumococcal Conjugate-13 09/08/2014  . Pneumococcal Polysaccharide-23 10/14/2005  . Td 08/14/1996  . Tdap 09/08/2014  . Zoster 11/15/2011    Conditions to be addressed/monitored:  Hypertension, Hyperlipidemia, Diabetes, GERD, Hypothyroidism and Gout  There are no care plans that you recently modified to display for this patient.   Current Barriers:  . {pharmacybarriers:24917} . ***  Pharmacist Clinical Goal(s):  Marland Kitchen Patient will {PHARMACYGOALCHOICES:24921} through collaboration with PharmD and provider.  . ***  Interventions: . 1:1 collaboration with Laurey Morale, MD regarding development and update of comprehensive plan of care as evidenced by provider attestation and co-signature . Inter-disciplinary care team collaboration (see longitudinal plan of  care) . Comprehensive medication review performed; medication list updated in electronic medical record  Hypertension (BP goal <140/90) -Controlled -Current treatment:  losartan 119m, 1 tablet once daily   furosemide 474m 1 tablet once daily  -Medications previously tried: lisinopril, olmesartan -Current home readings: *** -Current dietary habits: *** -Current exercise habits: *** -{ACTIONS;DENIES/REPORTS:21021675::"Denies"} hypotensive/hypertensive symptoms -Educated on {CCM BP Counseling:25124} -Counseled to monitor BP at home ***, document, and provide log at future appointments -{CCMPHARMDINTERVENTION:25122}  -still swelling? Improved?  Hyperlipidemia/arteriosclerosis of right carotid artery: (LDL goal < 100) -Controlled -Current treatment: . Aspirin 81 mg 1 tablet daily -Medications previously tried: pravastatin -Current dietary patterns: *** -Current exercise habits: *** -Educated  on {CCM HLD Counseling:25126} -{CCMPHARMDINTERVENTION:25122}  Diabetes (A1c goal <7%) -Controlled -Current medications:  metformin ER 538m, 4 tablets once daily  -Medications previously tried: Januvia (falls) -Current home glucose readings . fasting glucose: *** . post prandial glucose: *** -{ACTIONS;DENIES/REPORTS:21021675::"Denies"} hypoglycemic/hyperglycemic symptoms -Current meal patterns:  . breakfast: ***  . lunch: ***  . dinner: *** . snacks: *** . drinks: *** -Current exercise: *** -Educated on {CCM DM COUNSELING:25123} -Counseled to check feet daily and get yearly eye exams -{CCMPHARMDINTERVENTION:25122}  Hypothyroidism (Goal: TSH 0.35-4.5) -Controlled -Current treatment   Levothyroxine 1040m, 1 tablet once daily  -Medications previously tried: none -{CCMPHARMDINTERVENTION:25122}  GERD (Goal: ***) -{US controlled/uncontrolled:25276} -Current treatment  . *** -Medications previously tried: omeprazole -Counseled on non-pharmacological interventions for acid  reflux. Take measures to prevent acid reflux, such as avoiding spicy foods, avoiding caffeine, avoid laying down a few hours after eating, and raising the head of the bed.; discussed using PRN tums if needed  Gout (Goal: ***) -{US controlled/uncontrolled:25276} -Current treatment   allopurinol 10067m1 tablet once daily -Medications previously tried: colchicine  -{CCMPHARMDINTERVENTION:25122} Recommend repeat uric acid level  Neck pain (Goal: ***) -{US controlled/uncontrolled:25276} -Current treatment   Diclofenac 1% gel - patient has not been using -Medications previously tried: ***  -{CCMPHARMDINTERVENTION:25122}  Using diclofenac gel as needed for joint pain (may help with neck pain); we discussed the benefits of topical therapy vs oral NSAIDs  Osteoporosis / Osteopenia (Goal ***) -Not ideally controlled Last DEXA Scan: 11/2015             T-Score femoral neck: RFN -2.1, LFN -1.7             T-Score lumbar spine: + 0.7             10-year probability of major osteoporotic fracture: 15.2%             10-year probability of hip fracture: 4.6% -Patient is a candidate for pharmacologic treatment due to T-Score -1.0 to -2.5 and 10-year risk of hip fracture > 3% -Current treatment   Vitamin D 2000 units 1 capsule daily -Medications previously tried: Reclast (stopping going to Dr. EllLoanne Drilling{Osteoporosis Counseling:23892} -{CCMPHARMDINTERVENTION:25122} -reclast? Ask dr fry if she should continue?   Health Maintenance -Vaccine gaps: shingrix, influeunza? -Current therapy:  . No medications -Educated on {ccm supplement counseling:25128} -{CCM Patient satisfied:25129} -{CCMPHARMDINTERVENTION:25122}   Patient Goals/Self-Care Activities . Patient will:  - {pharmacypatientgoals:24919}  Follow Up Plan: {CM FOLLOW UP PLAUYQI:34742}Medication Assistance: {MEDASSISTANCEINFO:25044}  [facility] [place]  [address]: [phone]: [phone] pill box? Yes Pt endorses 99% compliance (misses once a year)  We discussed: Current pharmacy is preferred with insurance plan and patient is satisfied with pharmacy services Patient decided to: Continue current medication management strategy  Care Plan and Follow Up Patient Decision:  Patient agrees to Care Plan and Follow-up.  Plan: Telephone follow up appointment with care management team member scheduled for:  ***  MadJeni SallesharmD BCABelfordarmacist [place] BraEast [phone]

## 2021-02-09 ENCOUNTER — Ambulatory Visit: Payer: PPO

## 2021-02-17 ENCOUNTER — Telehealth: Payer: Self-pay | Admitting: Family Medicine

## 2021-02-17 NOTE — Telephone Encounter (Signed)
Patient needs a letter stating-- she needs "Back Door Service" due to her handicap of not being able to walk without a walker.   Please contact patient [phone] and patient's niece Normand Sloop [phone] letting them know when this service can start.  Please include patient's [employer] account number-- [phone]  **Fax to- [phone]**

## 2021-02-18 NOTE — Telephone Encounter (Signed)
The letter is ready  

## 2021-02-18 NOTE — Telephone Encounter (Signed)
Pt  letter has been faxed as requested, copy send to scanning

## 2021-04-01 DIAGNOSIS — L84 Corns and callosities: Secondary | ICD-10-CM | POA: Diagnosis not present

## 2021-04-01 DIAGNOSIS — B351 Tinea unguium: Secondary | ICD-10-CM | POA: Diagnosis not present

## 2021-04-01 DIAGNOSIS — M79676 Pain in unspecified toe(s): Secondary | ICD-10-CM | POA: Diagnosis not present

## 2021-04-01 DIAGNOSIS — E1142 Type 2 diabetes mellitus with diabetic polyneuropathy: Secondary | ICD-10-CM | POA: Diagnosis not present

## 2021-04-07 ENCOUNTER — Telehealth: Payer: Self-pay | Admitting: Pharmacist

## 2021-04-07 ENCOUNTER — Other Ambulatory Visit: Payer: Self-pay | Admitting: Family Medicine

## 2021-04-07 NOTE — Chronic Care Management (AMB) (Signed)
Chronic Care Management Pharmacy Assistant   Name: Destiny Flowers  MRN: [id number] DOB: Jul 09, 1929  Reason for Encounter: Disease State/ Diabetes Assessment Call.   Conditions to be addressed/monitored: DMII   Recent office visits:  01/15/21 Gershon Crane MD (PCP) - seen for bilateral leg edema and other chronic issues. Increased lasix to 40mg  daily. Recheck labs and no follow up noted.   Recent consult visits:  10/22/20 14/9/21 (Ophthalmology) - seen for dry eye and other chronic conditions. No medication changes or follow up noted.   Hospital visits:  None in previous 6 months  Medications: Outpatient Encounter Medications as of 04/07/2021  Medication Sig  . allopurinol (ZYLOPRIM) 100 MG tablet Take 1 tablet by mouth once daily  . aspirin 81 MG tablet Take 81 mg by mouth daily.  . diclofenac Sodium (VOLTAREN) 1 % GEL Apply 2 g topically 4 (four) times daily.  . EUTHYROX 100 MCG tablet TAKE 1 TABLET BY MOUTH ONCE DAILY BEFORE BREAKFAST  . folic acid (FOLVITE) 800 MCG tablet Take 800 mcg by mouth daily.  . furosemide (LASIX) 40 MG tablet Take 1 tablet (40 mg total) by mouth daily.  04/09/2021 glucose blood (ONETOUCH VERIO) test strip 1 each by Other route daily. And lancets 1/day  . Iron, Ferrous Sulfate, 325 (65 Fe) MG TABS Take 1 tablet by mouth daily.  12-13-1979 losartan (COZAAR) 100 MG tablet Take 1 tablet by mouth once daily  . magnesium oxide (MAG-OX) 400 MG tablet Take 400 mg by mouth daily.  . metFORMIN (GLUCOPHAGE-XR) 500 MG 24 hr tablet TAKE 4 TABLETS BY MOUTH ONCE DAILY  . Multiple Vitamin (MULTIVITAMIN) tablet Take 1 tablet by mouth daily.  Marland Kitchen omeprazole (PRILOSEC OTC) 20 MG tablet Take 20 mg by mouth daily.  . traZODone (DESYREL) 100 MG tablet Take 1 tablet (100 mg total) by mouth daily with breakfast.  . TURMERIC PO Take 500 mg by mouth daily.  . vitamin B-12 (CYANOCOBALAMIN) 500 MCG tablet Take 500 mcg by mouth daily.  . Vitamin D, Cholecalciferol, 50 MCG (2000 UT) CAPS Take  1 capsule by mouth daily.   No facility-administered encounter medications on file as of 04/07/2021.    Recent Relevant Labs: Lab Results  Component Value Date/Time   HGBA1C 6.5 03/26/2020 09:49 AM   HGBA1C 6.7 (A) 05/29/2018 01:19 PM   HGBA1C 6.5 11/29/2017 01:30 PM   HGBA1C 6.1 11/29/2016 01:59 PM   MICROALBUR 1.0 11/29/2017 02:06 PM   MICROALBUR 0.8 11/29/2016 01:59 PM    Kidney Function Lab Results  Component Value Date/Time   CREATININE 1.16 01/15/2021 01:37 PM   CREATININE 1.22 (H) 03/26/2020 09:49 AM   GFR 41.73 (L) 01/15/2021 01:37 PM   GFRNONAA 56.84 04/30/2010 08:20 AM   GFRAA 69 12/09/2008 12:00 AM    . Current antihyperglycemic regimen:  . Metformin ER 500mg , 4 tablets once daily   . What recent interventions/DTPs have been made to improve glycemic control:  o None.   . Have there been any recent hospitalizations or ED visits since last visit with CPP? No  . Are you checking your feet daily/regularly?   Adherence Review: Is the patient currently on a STATIN medication? No Is the patient currently on ACE/ARB medication? Yes Does the patient have >5 day gap between last estimated fill dates? Yes  Notes:  Called and spoke with patient who states that she does not care to deal with 12/11/2008 the clinical pharmacist and she only wants to deal with Dr. .  I tried to explain service and program to patient ad she hung up on me.   Star Rating Drugs:   Metformin 500mg  - last filled on 12/29/20 90DS at [facility]  Losartan 100mg  - last filled on 12/30/20 90DS at [facility] CMA  Clinical Pharmacist Assistant [phone]

## 2021-04-08 ENCOUNTER — Other Ambulatory Visit: Payer: Self-pay | Admitting: Family Medicine

## 2021-04-22 DIAGNOSIS — E119 Type 2 diabetes mellitus without complications: Secondary | ICD-10-CM | POA: Diagnosis not present

## 2021-04-22 DIAGNOSIS — H524 Presbyopia: Secondary | ICD-10-CM | POA: Diagnosis not present

## 2021-04-22 DIAGNOSIS — H04123 Dry eye syndrome of bilateral lacrimal glands: Secondary | ICD-10-CM | POA: Diagnosis not present

## 2021-04-22 DIAGNOSIS — Z961 Presence of intraocular lens: Secondary | ICD-10-CM | POA: Diagnosis not present

## 2021-04-22 LAB — HM DIABETES EYE EXAM

## 2021-04-26 ENCOUNTER — Encounter: Payer: Self-pay | Admitting: Family Medicine

## 2021-05-22 ENCOUNTER — Other Ambulatory Visit: Payer: Self-pay | Admitting: Family Medicine

## 2021-07-01 DIAGNOSIS — M79676 Pain in unspecified toe(s): Secondary | ICD-10-CM | POA: Diagnosis not present

## 2021-07-01 DIAGNOSIS — B351 Tinea unguium: Secondary | ICD-10-CM | POA: Diagnosis not present

## 2021-07-01 DIAGNOSIS — L84 Corns and callosities: Secondary | ICD-10-CM | POA: Diagnosis not present

## 2021-07-01 DIAGNOSIS — E1142 Type 2 diabetes mellitus with diabetic polyneuropathy: Secondary | ICD-10-CM | POA: Diagnosis not present

## 2021-07-20 ENCOUNTER — Other Ambulatory Visit: Payer: Self-pay | Admitting: Family Medicine

## 2021-08-24 ENCOUNTER — Other Ambulatory Visit: Payer: Self-pay

## 2021-08-24 ENCOUNTER — Encounter: Payer: Self-pay | Admitting: Family Medicine

## 2021-08-24 ENCOUNTER — Ambulatory Visit (INDEPENDENT_AMBULATORY_CARE_PROVIDER_SITE_OTHER): Payer: PPO | Admitting: Family Medicine

## 2021-08-24 VITALS — BP 106/60 | HR 78 | Temp 97.5°F | Wt 180.0 lb

## 2021-08-24 DIAGNOSIS — J3089 Other allergic rhinitis: Secondary | ICD-10-CM | POA: Diagnosis not present

## 2021-08-24 DIAGNOSIS — I10 Essential (primary) hypertension: Secondary | ICD-10-CM

## 2021-08-24 DIAGNOSIS — R600 Localized edema: Secondary | ICD-10-CM | POA: Diagnosis not present

## 2021-08-24 DIAGNOSIS — J302 Other seasonal allergic rhinitis: Secondary | ICD-10-CM | POA: Diagnosis not present

## 2021-08-24 DIAGNOSIS — E1122 Type 2 diabetes mellitus with diabetic chronic kidney disease: Secondary | ICD-10-CM | POA: Diagnosis not present

## 2021-08-24 DIAGNOSIS — Z23 Encounter for immunization: Secondary | ICD-10-CM | POA: Diagnosis not present

## 2021-08-24 LAB — POCT GLYCOSYLATED HEMOGLOBIN (HGB A1C): Hemoglobin A1C: 6.4 % — AB (ref 4.0–5.6)

## 2021-08-24 NOTE — Progress Notes (Signed)
   Subjective:    Patient ID: Destiny Flowers, female    DOB: October 28, 1929, 87 y.o.   MRN: [id number]  HPI Here to follow up. She is doing well in general. She still has swelling in both legs but this is manageable by taking Lasix and elevating her legs. No SOB. She complains of PND and runny nose at times. Her A1c today is 6.4%.   Review of Systems  Constitutional: Negative.   HENT:  Positive for postnasal drip.   Respiratory: Negative.    Cardiovascular:  Positive for leg swelling. Negative for chest pain and palpitations.      Objective:   Physical Exam Constitutional:      Comments: Walks with a walker   Cardiovascular:     Rate and Rhythm: Normal rate and regular rhythm.     Pulses: Normal pulses.     Heart sounds: Normal heart sounds.  Pulmonary:     Effort: Pulmonary effort is normal.     Breath sounds: Normal breath sounds.  Musculoskeletal:     Comments: 3+ edema in both lower legs   Neurological:     General: No focal deficit present.     Mental Status: She is alert and oriented to person, place, and time.          Assessment & Plan:  Her HTN and diabetes are stable. For the leg edema she will continue with Lasix and elevation. She declines to wear support stockings. For the allergy symptoms, she will try Zyrtec 10 mg daily. Recheck in 6 months. Gershon Crane, MD

## 2021-08-24 NOTE — Addendum Note (Signed)
Addended by: Carola Rhine on: 08/24/2021 02:38 PM   Modules accepted: Orders

## 2021-09-09 ENCOUNTER — Other Ambulatory Visit: Payer: Self-pay | Admitting: Family Medicine

## 2021-09-30 DIAGNOSIS — L84 Corns and callosities: Secondary | ICD-10-CM | POA: Diagnosis not present

## 2021-09-30 DIAGNOSIS — B351 Tinea unguium: Secondary | ICD-10-CM | POA: Diagnosis not present

## 2021-09-30 DIAGNOSIS — E1142 Type 2 diabetes mellitus with diabetic polyneuropathy: Secondary | ICD-10-CM | POA: Diagnosis not present

## 2021-09-30 DIAGNOSIS — M79676 Pain in unspecified toe(s): Secondary | ICD-10-CM | POA: Diagnosis not present

## 2021-10-12 ENCOUNTER — Other Ambulatory Visit: Payer: Self-pay | Admitting: Family Medicine

## 2021-11-03 ENCOUNTER — Other Ambulatory Visit: Payer: Self-pay | Admitting: Family Medicine

## 2021-12-09 DIAGNOSIS — E1142 Type 2 diabetes mellitus with diabetic polyneuropathy: Secondary | ICD-10-CM | POA: Diagnosis not present

## 2021-12-09 DIAGNOSIS — M79676 Pain in unspecified toe(s): Secondary | ICD-10-CM | POA: Diagnosis not present

## 2021-12-09 DIAGNOSIS — B351 Tinea unguium: Secondary | ICD-10-CM | POA: Diagnosis not present

## 2021-12-09 DIAGNOSIS — L84 Corns and callosities: Secondary | ICD-10-CM | POA: Diagnosis not present

## 2022-01-11 ENCOUNTER — Other Ambulatory Visit: Payer: Self-pay | Admitting: Family Medicine

## 2022-01-27 ENCOUNTER — Other Ambulatory Visit: Payer: Self-pay | Admitting: Family Medicine

## 2022-02-23 ENCOUNTER — Other Ambulatory Visit: Payer: Self-pay | Admitting: Family Medicine

## 2022-03-07 ENCOUNTER — Telehealth: Payer: Self-pay

## 2022-03-07 NOTE — Telephone Encounter (Signed)
Called pt Niece, left a message to call the office. Dr Sarajane Jews needs information regarding pt. ?

## 2022-03-08 ENCOUNTER — Telehealth: Payer: Self-pay | Admitting: Family Medicine

## 2022-03-08 ENCOUNTER — Telehealth: Payer: Self-pay

## 2022-03-08 NOTE — Telephone Encounter (Signed)
Destiny Flowers is calling from [place] [employer] [facility] and would like dr fry to return her call around 330 pm to discuss the death certificate ?

## 2022-03-08 NOTE — Telephone Encounter (Signed)
We took care of this?

## 2022-03-08 NOTE — Telephone Encounter (Signed)
Destiny Flowers, niece of [name] on the line regarding the death certificate. she asked to speak with her directly. ?

## 2022-03-08 NOTE — Telephone Encounter (Signed)
The death certificate is complete  ?

## 2022-03-08 NOTE — Telephone Encounter (Signed)
I left a detailed message on Destiny Flowers's voicemail offering our condolences and to inform her Dr Sarajane Jews completed the certificate as below. ?

## 2022-03-08 NOTE — Telephone Encounter (Signed)
Message sent to PCP fr advise ?

## 2022-03-08 NOTE — Telephone Encounter (Signed)
Destiny Flowers call and stated pt nephew found her at home on the floor at 4:45 pm. ?

## 2022-03-09 NOTE — Telephone Encounter (Signed)
I changed the date and time of death and resubmitted it  ?

## 2022-03-09 NOTE — Telephone Encounter (Signed)
Niece came into the office to discuss the date change that needed to be made on the death certificate. The date needs to be 2022-03-17 at approximately 4:45 pm . The death certificate has been sent back to [facility] from [facility] for changes.   ?

## 2022-03-10 NOTE — Telephone Encounter (Addendum)
[place] with [employer] [employer] [facility] will resend DC through [facility] system so that md can sign etc. Kenney Houseman is aware md out of office this afternoon and will return 03/11/2022 ?

## 2022-03-14 DEATH — deceased
